# Patient Record
Sex: Female | Born: 1970
Health system: Southern US, Community
[De-identification: ages and names within clinical notes are randomized; demographics above are authoritative.]

## PROBLEM LIST (undated history)

## (undated) DIAGNOSIS — M5430 Sciatica, unspecified side: Secondary | ICD-10-CM

## (undated) DIAGNOSIS — D696 Thrombocytopenia, unspecified: Secondary | ICD-10-CM

## (undated) DIAGNOSIS — I5042 Chronic combined systolic (congestive) and diastolic (congestive) heart failure: Secondary | ICD-10-CM

## (undated) DIAGNOSIS — Z720 Tobacco use: Secondary | ICD-10-CM

## (undated) DIAGNOSIS — G4733 Obstructive sleep apnea (adult) (pediatric): Secondary | ICD-10-CM

## (undated) DIAGNOSIS — I110 Hypertensive heart disease with heart failure: Secondary | ICD-10-CM

## (undated) DIAGNOSIS — J45909 Unspecified asthma, uncomplicated: Secondary | ICD-10-CM

## (undated) DIAGNOSIS — I428 Other cardiomyopathies: Secondary | ICD-10-CM

## (undated) DIAGNOSIS — I272 Pulmonary hypertension, unspecified: Secondary | ICD-10-CM

## (undated) HISTORY — DX: Chronic combined systolic (congestive) and diastolic (congestive) heart failure: I50.42

## (undated) HISTORY — DX: Pulmonary hypertension, unspecified: I27.20

## (undated) HISTORY — DX: Other cardiomyopathies: I42.8

## (undated) HISTORY — PX: SMALL INTESTINE SURGERY: SHX150

## (undated) HISTORY — PX: APPENDECTOMY: SHX54

## (undated) HISTORY — PX: CHOLECYSTECTOMY: SHX55

## (undated) HISTORY — DX: Obstructive sleep apnea (adult) (pediatric): G47.33

## (undated) HISTORY — DX: Hypertensive heart disease with heart failure: I11.0

---

## 1998-12-11 ENCOUNTER — Encounter: Payer: Self-pay | Admitting: General Surgery

## 1998-12-11 ENCOUNTER — Encounter: Payer: Self-pay | Admitting: Emergency Medicine

## 1998-12-11 ENCOUNTER — Inpatient Hospital Stay (HOSPITAL_COMMUNITY): Admission: EM | Admit: 1998-12-11 | Discharge: 1998-12-15 | Payer: Self-pay | Admitting: Emergency Medicine

## 1999-03-06 ENCOUNTER — Encounter: Payer: Self-pay | Admitting: General Surgery

## 1999-03-06 ENCOUNTER — Ambulatory Visit (HOSPITAL_COMMUNITY): Admission: RE | Admit: 1999-03-06 | Discharge: 1999-03-06 | Payer: Self-pay | Admitting: General Surgery

## 2002-07-08 ENCOUNTER — Emergency Department (HOSPITAL_COMMUNITY): Admission: EM | Admit: 2002-07-08 | Discharge: 2002-07-08 | Payer: Self-pay | Admitting: Emergency Medicine

## 2002-07-09 ENCOUNTER — Emergency Department (HOSPITAL_COMMUNITY): Admission: EM | Admit: 2002-07-09 | Discharge: 2002-07-09 | Payer: Self-pay | Admitting: Emergency Medicine

## 2004-06-18 ENCOUNTER — Emergency Department (HOSPITAL_COMMUNITY): Admission: EM | Admit: 2004-06-18 | Discharge: 2004-06-18 | Payer: Self-pay | Admitting: Emergency Medicine

## 2004-10-04 ENCOUNTER — Emergency Department (HOSPITAL_COMMUNITY): Admission: EM | Admit: 2004-10-04 | Discharge: 2004-10-04 | Payer: Self-pay | Admitting: Emergency Medicine

## 2005-01-12 ENCOUNTER — Emergency Department (HOSPITAL_COMMUNITY): Admission: EM | Admit: 2005-01-12 | Discharge: 2005-01-12 | Payer: Self-pay | Admitting: Emergency Medicine

## 2005-10-03 ENCOUNTER — Emergency Department (HOSPITAL_COMMUNITY): Admission: EM | Admit: 2005-10-03 | Discharge: 2005-10-03 | Payer: Self-pay | Admitting: Emergency Medicine

## 2006-01-31 ENCOUNTER — Emergency Department (HOSPITAL_COMMUNITY): Admission: EM | Admit: 2006-01-31 | Discharge: 2006-01-31 | Payer: Self-pay | Admitting: Emergency Medicine

## 2014-05-07 ENCOUNTER — Encounter (HOSPITAL_COMMUNITY): Payer: Self-pay | Admitting: Emergency Medicine

## 2014-05-07 ENCOUNTER — Emergency Department (HOSPITAL_COMMUNITY)
Admission: EM | Admit: 2014-05-07 | Discharge: 2014-05-08 | Disposition: A | Discharge status: Home / Self Care | Payer: Medicaid Other | Attending: Emergency Medicine | Admitting: Emergency Medicine

## 2014-05-07 DIAGNOSIS — M545 Low back pain: Secondary | ICD-10-CM | POA: Diagnosis present

## 2014-05-07 DIAGNOSIS — N390 Urinary tract infection, site not specified: Secondary | ICD-10-CM | POA: Diagnosis not present

## 2014-05-07 DIAGNOSIS — I10 Essential (primary) hypertension: Secondary | ICD-10-CM | POA: Diagnosis not present

## 2014-05-07 DIAGNOSIS — J3489 Other specified disorders of nose and nasal sinuses: Secondary | ICD-10-CM | POA: Insufficient documentation

## 2014-05-07 DIAGNOSIS — Z3202 Encounter for pregnancy test, result negative: Secondary | ICD-10-CM | POA: Insufficient documentation

## 2014-05-07 DIAGNOSIS — M62830 Muscle spasm of back: Secondary | ICD-10-CM | POA: Diagnosis not present

## 2014-05-07 DIAGNOSIS — M5442 Lumbago with sciatica, left side: Secondary | ICD-10-CM | POA: Diagnosis not present

## 2014-05-07 DIAGNOSIS — Z720 Tobacco use: Secondary | ICD-10-CM | POA: Insufficient documentation

## 2014-05-07 DIAGNOSIS — J45909 Unspecified asthma, uncomplicated: Secondary | ICD-10-CM | POA: Insufficient documentation

## 2014-05-07 DIAGNOSIS — R350 Frequency of micturition: Secondary | ICD-10-CM

## 2014-05-07 HISTORY — DX: Unspecified asthma, uncomplicated: J45.909

## 2014-05-07 HISTORY — DX: Sciatica, unspecified side: M54.30

## 2014-05-07 LAB — URINALYSIS, ROUTINE W REFLEX MICROSCOPIC
Bilirubin Urine: NEGATIVE
GLUCOSE, UA: NEGATIVE mg/dL
Ketones, ur: NEGATIVE mg/dL
Nitrite: NEGATIVE
PROTEIN: 100 mg/dL — AB
Specific Gravity, Urine: 1.018 (ref 1.005–1.030)
UROBILINOGEN UA: 1 mg/dL (ref 0.0–1.0)
pH: 6.5 (ref 5.0–8.0)

## 2014-05-07 LAB — I-STAT CHEM 8, ED
BUN: 11 mg/dL (ref 6–23)
CALCIUM ION: 1.04 mmol/L — AB (ref 1.12–1.23)
CHLORIDE: 98 mmol/L (ref 96–112)
CREATININE: 1 mg/dL (ref 0.50–1.10)
Glucose, Bld: 99 mg/dL (ref 70–99)
HCT: 45 % (ref 36.0–46.0)
Hemoglobin: 15.3 g/dL — ABNORMAL HIGH (ref 12.0–15.0)
Potassium: 3.7 mmol/L (ref 3.5–5.1)
Sodium: 138 mmol/L (ref 135–145)
TCO2: 25 mmol/L (ref 0–100)

## 2014-05-07 LAB — POC URINE PREG, ED: Preg Test, Ur: NEGATIVE

## 2014-05-07 LAB — URINE MICROSCOPIC-ADD ON

## 2014-05-07 MED ORDER — DIAZEPAM 2 MG PO TABS
2.0000 mg | ORAL_TABLET | Freq: Once | ORAL | Status: AC
  Administered 2014-05-07: 2 mg via ORAL
  Filled 2014-05-07: qty 1

## 2014-05-07 MED ORDER — ALBUTEROL SULFATE HFA 108 (90 BASE) MCG/ACT IN AERS
2.0000 | INHALATION_SPRAY | Freq: Once | RESPIRATORY_TRACT | Status: AC
  Administered 2014-05-07: 2 via RESPIRATORY_TRACT
  Filled 2014-05-07: qty 6.7

## 2014-05-07 MED ORDER — HYDROCODONE-ACETAMINOPHEN 5-325 MG PO TABS
1.0000 | ORAL_TABLET | Freq: Once | ORAL | Status: AC
  Administered 2014-05-07: 1 via ORAL
  Filled 2014-05-07: qty 1

## 2014-05-07 NOTE — ED Provider Notes (Signed)
CSN: [id number]     Arrival date & time 05/07/14  1824 History   First MD Initiated Contact with Patient 05/07/14 2201     Chief Complaint  Patient presents with  . Back Pain     (Consider location/radiation/quality/duration/timing/severity/associated sxs/prior Treatment) HPI Comments: JARIYA REICHOW is a 44 y.o. female with a PMHx of asthma and sciatica, with a PSHx of cholecystectomy, appendectomy, and c-section, who presents to the ED with complaints of low back pain 2 days. She reports that she has 8/10 crampy low back/SI joint pain bilaterally which radiates down the left posterior leg, constant, worsening with ambulation, unrelieved with heat and medications chart prior to arrival. Denies any recent trauma/heavy lifting/twisting. States she has a history of sciatica and this is similar pain. Also reports having a headache that began today, gradual onset frontal, similar to her prior headaches related to her sinus congestion. She has not taken anything for her sinuses today, but in the past has taken Tylenol Sinus medication. She denies that this is the worst headache of her life or had any thunderclap onset. Additionally she reports increased urinary frequency and urgency over the last few days. States that she had a history of hypertension in pregnancy and took HCTZ for some time, but she has not been seen by a PCP in many years therefore she is not sure if still has HTN issues. Denies fevers, chills, sore throat, ear symptoms, rhinorrhea, CP, SOB, cough, abd pain, N/V/D/C, hematuria, dysuria, vaginal bleeding/discharge, myalgias, arthralgias, neck pain/stiffness, pelvic pain, cauda equina symptoms, weakness, numbness, dizziness, lightheadedness, syncope, vision changes, or rashes.   Patient is a 44 y.o. female presenting with back pain. The history is provided by the patient. No language interpreter was used.  Back Pain Location:  Sacro-iliac joint and lumbar spine Quality:  Cramping Radiates  to:  L posterior upper leg Pain severity:  Moderate Pain is:  Same all the time Onset quality:  Gradual Duration:  2 days Timing:  Constant Progression:  Unchanged Chronicity:  Recurrent Context: not recent illness, not recent injury and not twisting   Relieved by:  Nothing Worsened by:  Ambulation Ineffective treatments:  Heating pad Associated symptoms: headaches (chronic, same as her sinus headaches) and tingling (posterior L leg)   Associated symptoms: no abdominal pain, no bladder incontinence, no bowel incontinence, no chest pain, no dysuria, no fever, no leg pain, no numbness, no paresthesias, no pelvic pain, no perianal numbness and no weakness   Risk factors: lack of exercise and obesity   Risk factors: no hx of cancer     Past Medical History  Diagnosis Date  . Sciatica   . Asthma    Past Surgical History  Procedure Laterality Date  . Cholecystectomy    . Appendectomy    . Cesarean section    . Small intestine surgery     No family history on file. History  Substance Use Topics  . Smoking status: Current Every Day Smoker  . Smokeless tobacco: Not on file  . Alcohol Use: Yes   OB History    No data available     Review of Systems  Constitutional: Negative for fever and chills.  HENT: Positive for sinus pressure. Negative for ear discharge, ear pain, rhinorrhea, sore throat and trouble swallowing.   Eyes: Negative for photophobia and visual disturbance.  Respiratory: Negative for cough, shortness of breath and wheezing.   Cardiovascular: Negative for chest pain and leg swelling.  Gastrointestinal: Negative for nausea, vomiting, abdominal  pain, diarrhea, constipation and bowel incontinence.  Genitourinary: Positive for urgency and frequency. Negative for bladder incontinence, dysuria, hematuria, flank pain, vaginal bleeding, vaginal discharge and pelvic pain.  Musculoskeletal: Positive for back pain. Negative for myalgias, joint swelling, arthralgias, neck pain  and neck stiffness.  Skin: Negative for color change.  Neurological: Positive for tingling (posterior L leg) and headaches (chronic, same as her sinus headaches). Negative for dizziness, syncope, weakness, light-headedness, numbness and paresthesias.       +tingling down posterior L leg  Psychiatric/Behavioral: Negative for confusion.   10 Systems reviewed and are negative for acute change except as noted in the HPI.    Allergies  Review of patient's allergies indicates no known allergies.  Home Medications   Prior to Admission medications   Not on File   BP 165/107 mmHg  Pulse 113  Temp(Src) 98.2 F (36.8 C) (Oral)  Resp 22  SpO2 93%  LMP 12/06/2013 Physical Exam  Constitutional: She is oriented to person, place, and time. She appears well-developed and well-nourished.  Non-toxic appearance. No distress.  Afebrile, nontoxic, NAD, Triage vitals showing mild tachycardia at 113, BP 165/107. Tachycardia improved upon exam. Morbidly obese AAF  HENT:  Head: Normocephalic and atraumatic.  Nose: Mucosal edema and rhinorrhea present. Right sinus exhibits maxillary sinus tenderness and frontal sinus tenderness. Left sinus exhibits maxillary sinus tenderness and frontal sinus tenderness.  Mouth/Throat: Uvula is midline, oropharynx is clear and moist and mucous membranes are normal. No trismus in the jaw. No uvula swelling.  Nose with mucosal edema and clear rhinorrhea, b/l sinus TTP Oropharynx clear without tonsillar swelling or exudates.  Eyes: Conjunctivae and EOM are normal. Pupils are equal, round, and reactive to light. Right eye exhibits no discharge. Left eye exhibits no discharge.  PERRL, EOMI, no nystagmus, no visual field deficits  Neck: Normal range of motion. Neck supple. No spinous process tenderness and no muscular tenderness present. No rigidity. Normal range of motion present.  FROM intact without spinous process or paraspinous muscle TTP, no bony stepoffs or deformities, no  muscle spasms. No rigidity or meningeal signs. No bruising or swelling.   Cardiovascular: Normal rate, regular rhythm, normal heart sounds and intact distal pulses.  Exam reveals no gallop and no friction rub.   No murmur heard. Initially tachycardic in triage, resolved upon exam. Reg rhythm, nl s1/s2, no m/r/g, distal pulses intact, no pedal edema  Pulmonary/Chest: Effort normal and breath sounds normal. No respiratory distress. She has no decreased breath sounds. She has no wheezes. She has no rhonchi. She has no rales.  CTAB in all lung fields, no w/r/r, no hypoxia or increased WOB, speaking in full sentences, SpO2 93% on RA   Abdominal: Soft. Normal appearance and bowel sounds are normal. She exhibits no distension. There is no tenderness. There is no rigidity, no rebound, no guarding, no CVA tenderness, no tenderness at McBurney's point and negative Murphy's sign.  Soft, obese which limits exam, but NTND, +BS throughout, no r/g/r, neg murphy's, neg mcburney's, no CVA TTP   Musculoskeletal: Normal range of motion.       Lumbar back: She exhibits tenderness and spasm. She exhibits normal range of motion, no bony tenderness and no deformity.       Back:  Morbidly obese which somewhat limits exam, but lumbar spine with FROM intact without spinous process TTP, no bony stepoffs or deformities, mild b/l paraspinous muscle TTP with slight muscle spasms. Strength 5/5 in all extremities, sensation grossly intact in all extremities, +  L leg SLR, neg SLR on R leg, gait steady. No overlying skin changes.   Neurological: She is alert and oriented to person, place, and time. She has normal strength. No cranial nerve deficit or sensory deficit. Coordination and gait normal. GCS eye subscore is 4. GCS verbal subscore is 5. GCS motor subscore is 6.  CN 2-12 grossly intact A&O x4 GCS 15 Sensation and strength intact Gait nonataxic  Coordination WNL  Skin: Skin is warm, dry and intact. No rash noted.    Psychiatric: She has a normal mood and affect.  Nursing note and vitals reviewed.   ED Course  Procedures (including critical care time) Labs Review Labs Reviewed  URINALYSIS, ROUTINE W REFLEX MICROSCOPIC - Abnormal; Notable for the following:    APPearance CLOUDY (*)    Hgb urine dipstick TRACE (*)    Protein, ur 100 (*)    Leukocytes, UA MODERATE (*)    All other components within normal limits  URINE MICROSCOPIC-ADD ON - Abnormal; Notable for the following:    Squamous Epithelial / LPF MANY (*)    Bacteria, UA FEW (*)    All other components within normal limits  I-STAT CHEM 8, ED - Abnormal; Notable for the following:    Calcium, Ion 1.04 (*)    Hemoglobin 15.3 (*)    All other components within normal limits  URINE CULTURE  CBC WITH DIFFERENTIAL/PLATELET  I-STAT TROPOININ, ED  POC URINE PREG, ED    Imaging Review No results found.   EKG Interpretation   Date/Time:  Friday May 07 2014 22:31:26 EST Ventricular Rate:  101 PR Interval:  161 QRS Duration: 101 QT Interval:  358 QTC Calculation: 464 R Axis:   115 Text Interpretation:  Sinus tachycardia Right axis deviation Abnormal  R-wave progression, late transition Borderline T wave abnormalities Since  previous tracing axis has shifted rightward Confirmed by Karma Ganja  MD,  MARTHA [phone]) on 05/07/2014 10:34:20 PM      MDM   Final diagnoses:  Bilateral low back pain with left-sided sciatica  Back muscle spasm  Urinary frequency  UTI (lower urinary tract infection)  HTN (hypertension), benign    44 y.o. female with low back pain/SI joint pain bilaterally, sciatica down L leg, +SLR. No red flag s/s of low back pain. No s/s of central cord compression or cauda equina. Lower extremities are neurovascularly intact and patient is ambulating without difficulty. Back with muscle TTP and spasms. Doubt need for imaging. Pt also has HA which she states is her typical sinus headache, nothing different, nonfocal neuro  exam, doubt need for imaging. BP elevated at 165/107 but I suspect this may be attributed to not having adequate resting period prior to having VS checked, pt is morbidly obese and this could contribute to exertional VS changes. Will recheck VS now after rest period. During exam, HR normalized. Will still proceed with basic labs, U/A (given urinary frequency), and EKG. Doubt need for CXR since pt not complaining of CP/SOB. Will give norco and valium tablet for her pain/spasms. Will reassess shortly  11:28 PM Pt feeling better with back pain. Last VS check noted temp of 100.5, but on recheck (with no PO intake in 1hr) it was 99.4 oral. SpO2 on my exam is 95-96%, pt states she usually uses an inhaler at this time but doesn't have it with her, no audible wheezing on auscultation, and no c/o CP/SOB/cough, doubt need for CXR but will give albuterol here. Still awaiting labs. U/A in  process. Upreg neg. Of note, her EKG shows slight RAD new from last EKG. Her HTN has also improved down to 159/91. Will reassess after labs and U/A return.  12:32 AM Chem 8 unremarkable. Trop neg. CBC w/diff unremarkable. Upreg neg. U/A showing contamination with many squamous, +leuks, 3-6 WBC, few bacteria. Given her urinary frequency complaint, will treat as UTI but will add on a UCx. Pt continues to feel improved. VS stable, no further tachycardia or HTN at this time. Will have her f/up with PCP listed on medicaid card (Dr. Ashley Royalty) for HTN management, and recheck of back pain/possible UTI. Patient was counseled on back pain precautions and told to do activity as tolerated but do not lift, push, or pull heavy objects more than 10 pounds for the next week. Patient counseled to use ice or heat on back for no longer than 15 minutes every hour. Rx given for muscle relaxer and counseled on proper use of muscle relaxant medication. Rx given for narcotic pain medicine and counseled on proper use of narcotic pain medications. Told that  they can increase to every 4 hrs if needed while pain is worse. Counseled not to combine this medication with others containing tylenol. Urged patient not to drink alcohol, drive, or perform any other activities that requires focus while taking either of these medications. Patient urged to follow-up with PCP if pain does not improve with treatment and rest or if pain becomes recurrent. Urged to return with worsening severe pain, loss of bowel or bladder control, trouble walking. The patient verbalizes understanding and agrees with the plan.   BP 159/91 mmHg  Pulse 102  Temp(Src) 99.2 F (37.3 C) (Oral)  Resp 20  SpO2 92%  LMP 12/06/2013  Meds ordered this encounter  Medications  . diazepam (VALIUM) tablet 2 mg    Sig:   . HYDROcodone-acetaminophen (NORCO/VICODIN) 5-325 MG per tablet 1 tablet    Sig:   . albuterol (PROVENTIL HFA;VENTOLIN HFA) 108 (90 BASE) MCG/ACT inhaler 2 puff    Sig:   . cyclobenzaprine (FLEXERIL) 10 MG tablet    Sig: Take 1 tablet (10 mg total) by mouth 3 (three) times daily as needed for muscle spasms.    Dispense:  15 tablet    Refill:  0    Order Specific Question:  Supervising Provider    Answer:  MILLER, BRIAN [[id number]]  . naproxen (NAPROSYN) 500 MG tablet    Sig: Take 1 tablet (500 mg total) by mouth 2 (two) times daily as needed for mild pain, moderate pain or headache (TAKE WITH MEALS.).    Dispense:  20 tablet    Refill:  0    Order Specific Question:  Supervising Provider    Answer:  MILLER, BRIAN [[id number]]  . predniSONE (DELTASONE) 20 MG tablet    Sig: 3 tabs po daily x 4 days    Dispense:  12 tablet    Refill:  0    Order Specific Question:  Supervising Provider    Answer:  MILLER, BRIAN [[id number]]  . HYDROcodone-acetaminophen (NORCO) 5-325 MG per tablet    Sig: Take 1 tablet by mouth every 6 (six) hours as needed for severe pain.    Dispense:  10 tablet    Refill:  0    Order Specific Question:  Supervising Provider    Answer:  Hyacinth Meeker, BRIAN [[id number]]  .  sulfamethoxazole-trimethoprim (BACTRIM DS,SEPTRA DS) 800-160 MG per tablet    Sig: Take 1 tablet by mouth 2 (two) times daily.  Dispense:  14 tablet    Refill:  0    Order Specific Question:  Supervising Provider    Answer:  Eber Hong [[id number]]       Karyl Sharrar Camprubi-Soms, PA-C 05/08/14 0034  Jerelyn Scott, MD 05/08/14 [phone]

## 2014-05-07 NOTE — ED Notes (Signed)
PA-C at bedside 

## 2014-05-07 NOTE — ED Notes (Addendum)
Pt c/o lower back to buttocks and down left leg pain. Pt states she does have sciatica. Pt has not been dx with HTN but does have HTN in triage along with a headache.

## 2014-05-08 LAB — CBC WITH DIFFERENTIAL/PLATELET
Basophils Absolute: 0.1 10*3/uL (ref 0.0–0.1)
Basophils Relative: 1 % (ref 0–1)
EOS PCT: 0 % (ref 0–5)
Eosinophils Absolute: 0 10*3/uL (ref 0.0–0.7)
HCT: 41.9 % (ref 36.0–46.0)
HEMOGLOBIN: 14 g/dL (ref 12.0–15.0)
LYMPHS PCT: 15 % (ref 12–46)
Lymphs Abs: 0.9 10*3/uL (ref 0.7–4.0)
MCH: 31 pg (ref 26.0–34.0)
MCHC: 33.4 g/dL (ref 30.0–36.0)
MCV: 92.7 fL (ref 78.0–100.0)
MONO ABS: 0.5 10*3/uL (ref 0.1–1.0)
MONOS PCT: 8 % (ref 3–12)
Neutro Abs: 4.3 10*3/uL (ref 1.7–7.7)
Neutrophils Relative %: 76 % (ref 43–77)
Platelets: 30 10*3/uL — ABNORMAL LOW (ref 150–400)
RBC: 4.52 MIL/uL (ref 3.87–5.11)
RDW: 13.2 % (ref 11.5–15.5)
WBC: 5.8 10*3/uL (ref 4.0–10.5)

## 2014-05-08 LAB — I-STAT TROPONIN, ED: Troponin i, poc: 0.01 ng/mL (ref 0.00–0.08)

## 2014-05-08 MED ORDER — CYCLOBENZAPRINE HCL 10 MG PO TABS: 10.0000 mg | ORAL_TABLET | Freq: Three times a day (TID) | ORAL | Status: DC | PRN

## 2014-05-08 MED ORDER — PREDNISONE 20 MG PO TABS: ORAL_TABLET | ORAL | Status: DC

## 2014-05-08 MED ORDER — NAPROXEN 500 MG PO TABS: 500.0000 mg | ORAL_TABLET | Freq: Two times a day (BID) | ORAL | Status: DC | PRN

## 2014-05-08 MED ORDER — SULFAMETHOXAZOLE-TRIMETHOPRIM 800-160 MG PO TABS: 1.0000 | ORAL_TABLET | Freq: Two times a day (BID) | ORAL | Status: DC

## 2014-05-08 MED ORDER — HYDROCODONE-ACETAMINOPHEN 5-325 MG PO TABS: 1.0000 | ORAL_TABLET | Freq: Four times a day (QID) | ORAL | Status: DC | PRN

## 2014-05-08 NOTE — Discharge Instructions (Signed)
Your blood pressure was high today, please see your regular doctor for ongoing care of this. Stay very well hydrated with plenty of water throughout the day. Take antibiotic until completed. Follow up with primary care physician in 1 week for recheck of ongoing symptoms but return to ER for emergent changing or worsening of symptoms. Please seek immediate care if you develop the following: You develop back pain.  Your symptoms are no better, or worse in 3 days. There is severe back pain or lower abdominal pain.  You develop chills.  You have a fever.  There is nausea or vomiting.  There is continued burning or discomfort with urination.    Back Pain:  Your back pain should be treated with medicines such as ibuprofen or aleve and this back pain should get better over the next 2 weeks.  However if you develop severe or worsening pain, low back pain with fever, numbness, weakness or inability to walk or urinate, you should return to the ER immediately.  Please follow up with your doctor this week for a recheck if still having symptoms.  Low back pain is discomfort in the lower back that may be due to injuries to muscles and ligaments around the spine.  Occasionally, it may be caused by a a problem to a part of the spine called a disc.  The pain may last several days or a week;  However, most patients get completely well in 4 weeks.  Self - care:  The application of heat can help soothe the pain.  Maintaining your daily activities, including walking, is encourged, as it will help you get better faster than just staying in bed. Perform gentle stretching as discussed. Drink plenty of fluids.  Medications are also useful to help with pain control.  A commonly prescribed medication includes vicodin.  Do not drive or operate heavy machinery while taking this medication.  Non steroidal anti inflammatory medications including Ibuprofen and naproxen;  These medications help both pain and swelling and are  very useful in treating back pain.  They should be taken with food, as they can cause stomach upset, and more seriously, stomach bleeding.    Muscle relaxants:  These medications can help with muscle tightness that is a cause of lower back pain.  Most of these medications can cause drowsiness, and it is not safe to drive or use dangerous machinery while taking them.  Prednisone: helps with sciatica pain, take as directed.  SEEK IMMEDIATE MEDICAL ATTENTION IF: New numbness, tingling, weakness, or problem with the use of your arms or legs.  Severe back pain not relieved with medications.  Difficulty with or loss of control of your bowel or bladder control.  Increasing pain in any areas of the body (such as chest or abdominal pain).  Shortness of breath, dizziness or fainting.  Nausea (feeling sick to your stomach), vomiting, fever, or sweats.  You will need to follow up with  Your primary healthcare provider in 1-2 weeks   Back Pain, Adult Back pain is very common. The pain often gets better over time. The cause of back pain is usually not dangerous. Most people can learn to manage their back pain on their own.  HOME CARE   Stay active. Start with short walks on flat ground if you can. Try to walk farther each day.  Do not sit, drive, or stand in one place for more than 30 minutes. Do not stay in bed.  Do not avoid exercise or work.  Activity can help your back heal faster.  Be careful when you bend or lift an object. Bend at your knees, keep the object close to you, and do not twist.  Sleep on a firm mattress. Lie on your side, and bend your knees. If you lie on your back, put a pillow under your knees.  Only take medicines as told by your doctor.  Put ice on the injured area.  Put ice in a plastic bag.  Place a towel between your skin and the bag.  Leave the ice on for 15-20 minutes, 03-04 times a day for the first 2 to 3 days. After that, you can switch between ice and heat  packs.  Ask your doctor about back exercises or massage.  Avoid feeling anxious or stressed. Find good ways to deal with stress, such as exercise. GET HELP RIGHT AWAY IF:   Your pain does not go away with rest or medicine.  Your pain does not go away in 1 week.  You have new problems.  You do not feel well.  The pain spreads into your legs.  You cannot control when you poop (bowel movement) or pee (urinate).  Your arms or legs feel weak or lose feeling (numbness).  You feel sick to your stomach (nauseous) or throw up (vomit).  You have belly (abdominal) pain.  You feel like you may pass out (faint). MAKE SURE YOU:   Understand these instructions.  Will watch your condition.  Will get help right away if you are not doing well or get worse. Document Released: 08/01/2007 Document Revised: 05/07/2011 Document Reviewed: 06/16/2013 [facility] Patient Information 2015 [place], [place]. This information is not intended to replace advice given to you by your health care provider. Make sure you discuss any questions you have with your health care provider.  Back Exercises Back exercises help treat and prevent back injuries. The goal of back exercises is to increase the strength of your abdominal and back muscles and the flexibility of your back. These exercises should be started when you no longer have back pain. Back exercises include:  Pelvic Tilt. Lie on your back with your knees bent. Tilt your pelvis until the lower part of your back is against the floor. Hold this position 5 to 10 sec and repeat 5 to 10 times.  Knee to Chest. Pull first 1 knee up against your chest and hold for 20 to 30 seconds, repeat this with the other knee, and then both knees. This may be done with the other leg straight or bent, whichever feels better.  Sit-Ups or Curl-Ups. Bend your knees 90 degrees. Start with tilting your pelvis, and do a partial, slow sit-up, lifting your trunk only 30 to 45 degrees off  the floor. Take at least 2 to 3 seconds for each sit-up. Do not do sit-ups with your knees out straight. If partial sit-ups are difficult, simply do the above but with only tightening your abdominal muscles and holding it as directed.  Hip-Lift. Lie on your back with your knees flexed 90 degrees. Push down with your feet and shoulders as you raise your hips a couple inches off the floor; hold for 10 seconds, repeat 5 to 10 times.  Back arches. Lie on your stomach, propping yourself up on bent elbows. Slowly press on your hands, causing an arch in your low back. Repeat 3 to 5 times. Any initial stiffness and discomfort should lessen with repetition over time.  Shoulder-Lifts. Lie face down with arms  beside your body. Keep hips and torso pressed to floor as you slowly lift your head and shoulders off the floor. Do not overdo your exercises, especially in the beginning. Exercises may cause you some mild back discomfort which lasts for a few minutes; however, if the pain is more severe, or lasts for more than 15 minutes, do not continue exercises until you see your caregiver. Improvement with exercise therapy for back problems is slow.  See your caregivers for assistance with developing a proper back exercise program. Document Released: 03/22/2004 Document Revised: 05/07/2011 Document Reviewed: 12/14/2010 [facility] Patient Information 2015 [place], [place]. This information is not intended to replace advice given to you by your health care provider. Make sure you discuss any questions you have with your health care provider.  Hypertension Hypertension is another name for high blood pressure. High blood pressure forces your heart to work harder to pump blood. A blood pressure reading has two numbers, which includes a higher number over a lower number (example: 110/72). HOME CARE   Have your blood pressure rechecked by your doctor.  Only take medicine as told by your doctor. Follow the directions  carefully. The medicine does not work as well if you skip doses. Skipping doses also puts you at risk for problems.  Do not smoke.  Monitor your blood pressure at home as told by your doctor. GET HELP IF:  You think you are having a reaction to the medicine you are taking.  You have repeat headaches or feel dizzy.  You have puffiness (swelling) in your ankles.  You have trouble with your vision. GET HELP RIGHT AWAY IF:   You get a very bad headache and are confused.  You feel weak, numb, or faint.  You get chest or belly (abdominal) pain.  You throw up (vomit).  You cannot breathe very well. MAKE SURE YOU:   Understand these instructions.  Will watch your condition.  Will get help right away if you are not doing well or get worse. Document Released: 08/01/2007 Document Revised: 02/17/2013 Document Reviewed: 12/05/2012 [facility] Patient Information 2015 [place], [place]. This information is not intended to replace advice given to you by your health care provider. Make sure you discuss any questions you have with your health care provider.  Urinary Tract Infection A urinary tract infection (UTI) can occur any place along the urinary tract. The tract includes the kidneys, ureters, bladder, and urethra. A type of germ called bacteria often causes a UTI. UTIs are often helped with antibiotic medicine.  HOME CARE   If given, take antibiotics as told by your doctor. Finish them even if you start to feel better.  Drink enough fluids to keep your pee (urine) clear or pale yellow.  Avoid tea, drinks with caffeine, and bubbly (carbonated) drinks.  Pee often. Avoid holding your pee in for a long time.  Pee before and after having sex (intercourse).  Wipe from front to back after you poop (bowel movement) if you are a woman. Use each tissue only once. GET HELP RIGHT AWAY IF:   You have back pain.  You have lower belly (abdominal) pain.  You have chills.  You feel sick to  your stomach (nauseous).  You throw up (vomit).  Your burning or discomfort with peeing does not go away.  You have a fever.  Your symptoms are not better in 3 days. MAKE SURE YOU:   Understand these instructions.  Will watch your condition.  Will get help right away if  you are not doing well or get worse. Document Released: 08/01/2007 Document Revised: 11/07/2011 Document Reviewed: 09/13/2011 [facility] Patient Information 2015 [place]. This information is not intended to replace advice given to you by your health care provider. Make sure you discuss any questions you have with your health care provider.

## 2014-05-09 LAB — URINE CULTURE
Colony Count: 70000
Special Requests: NORMAL

## 2015-03-08 ENCOUNTER — Emergency Department (HOSPITAL_COMMUNITY): Payer: Medicaid Other

## 2015-03-08 ENCOUNTER — Emergency Department (HOSPITAL_COMMUNITY)
Admission: EM | Admit: 2015-03-08 | Discharge: 2015-03-08 | Disposition: A | Discharge status: Home / Self Care | Payer: Medicaid Other | Attending: Emergency Medicine | Admitting: Emergency Medicine

## 2015-03-08 ENCOUNTER — Encounter (HOSPITAL_COMMUNITY): Payer: Self-pay

## 2015-03-08 DIAGNOSIS — Z792 Long term (current) use of antibiotics: Secondary | ICD-10-CM | POA: Diagnosis not present

## 2015-03-08 DIAGNOSIS — J45909 Unspecified asthma, uncomplicated: Secondary | ICD-10-CM | POA: Diagnosis not present

## 2015-03-08 DIAGNOSIS — F17200 Nicotine dependence, unspecified, uncomplicated: Secondary | ICD-10-CM | POA: Insufficient documentation

## 2015-03-08 DIAGNOSIS — M79672 Pain in left foot: Secondary | ICD-10-CM | POA: Diagnosis present

## 2015-03-08 MED ORDER — CEPHALEXIN 500 MG PO CAPS: 500.0000 mg | ORAL_CAPSULE | Freq: Four times a day (QID) | ORAL | Status: DC

## 2015-03-08 MED ORDER — HYDROCODONE-ACETAMINOPHEN 5-325 MG PO TABS: 1.0000 | ORAL_TABLET | Freq: Four times a day (QID) | ORAL | Status: DC | PRN

## 2015-03-08 MED ORDER — HYDROCODONE-ACETAMINOPHEN 5-325 MG PO TABS
2.0000 | ORAL_TABLET | Freq: Once | ORAL | Status: AC
  Administered 2015-03-08: 2 via ORAL
  Filled 2015-03-08: qty 2

## 2015-03-08 NOTE — ED Notes (Signed)
Pt with pain in left foot and ankle.  Had since last Tuesday.  Swelling started Thursday.  Pt using massage and epsom soaks at home with no relief.  Tylenol did not help.  Pt having difficulty ambulating.  No trauma.

## 2015-03-08 NOTE — ED Provider Notes (Signed)
History  By signing my name below, I, Karle Plumber, attest that this documentation has been prepared under the direction and in the presence of Rob Canovanas, [place]. Electronically Signed: Karle Plumber, ED Scribe. 03/08/2015. 12:42 PM.  Chief Complaint  Patient presents with  . Foot Pain   The history is provided by the patient and medical records. No language interpreter was used.    HPI Comments:  Jennifer Owens is a 45 y.o. obese female who presents to the Emergency Department complaining of severe left foot pain that began about one weeks ago. She states the foot began swelling and the throbbing pain increased about 5 days ago. She has been soaking the foot in epsom salt and taking Tylenol with no significant relief of the pain. Bearing weight and walking increase the pain. She denies alleviating factors. She denies numbness, tingling or weakness of the left foot. She denies trauma, injury or fall. She denies allergies to any medications.  Past Medical History  Diagnosis Date  . Sciatica   . Asthma    Past Surgical History  Procedure Laterality Date  . Cholecystectomy    . Appendectomy    . Cesarean section    . Small intestine surgery     History reviewed. No pertinent family history. Social History  Substance Use Topics  . Smoking status: Current Every Day Smoker  . Smokeless tobacco: None  . Alcohol Use: Yes   OB History    No data available     Review of Systems  Musculoskeletal: Positive for arthralgias.  Skin: Positive for color change (mild erythema).  Neurological: Negative for weakness and numbness.    Allergies  Review of patient's allergies indicates no known allergies.  Home Medications   Prior to Admission medications   Medication Sig Start Date End Date Taking? Authorizing Provider  cephALEXin (KEFLEX) 500 MG capsule Take 1 capsule (500 mg total) by mouth 4 (four) times daily. 03/08/15   Roxy Horseman, PA-C  cyclobenzaprine (FLEXERIL) 10 MG  tablet Take 1 tablet (10 mg total) by mouth 3 (three) times daily as needed for muscle spasms. 05/08/14   Mercedes Camprubi-Soms, PA-C  HYDROcodone-acetaminophen (NORCO/VICODIN) 5-325 MG tablet Take 1-2 tablets by mouth every 6 (six) hours as needed. 03/08/15   Roxy Horseman, PA-C  naproxen (NAPROSYN) 500 MG tablet Take 1 tablet (500 mg total) by mouth 2 (two) times daily as needed for mild pain, moderate pain or headache (TAKE WITH MEALS.). 05/08/14   Mercedes Camprubi-Soms, PA-C  predniSONE (DELTASONE) 20 MG tablet 3 tabs po daily x 4 days 05/08/14   Mercedes Camprubi-Soms, PA-C  sulfamethoxazole-trimethoprim (BACTRIM DS,SEPTRA DS) 800-160 MG per tablet Take 1 tablet by mouth 2 (two) times daily. 05/08/14   Mercedes Camprubi-Soms, PA-C   Triage Vitals: BP 165/115 mmHg  Pulse 91  Temp(Src) 98.2 F (36.8 C) (Oral)  Resp 18  SpO2 95% Physical Exam Physical Exam  Constitutional: Pt appears well-developed and well-nourished. No distress.  HENT:  Head: Normocephalic and atraumatic.  Eyes: Conjunctivae are normal.  Neck: Normal range of motion.  Cardiovascular: Normal rate, regular rhythm and intact distal pulses.   Capillary refill < 3 sec  Pulmonary/Chest: Effort normal and breath sounds normal.  Musculoskeletal: Pt exhibits tenderness over lateral aspect of left foot. No bony abnormality or deformity. Pt exhibits no edema.  ROM: 5/5  Neurological: Pt  is alert. Coordination normal.  Sensation 5/5 Strength 5/5  Skin: Skin is warm and dry. Pt is not diaphoretic. Mild erythema over lateral  aspect of left foot. No abscess. No tenting of the skin  Psychiatric: Pt has a normal mood and affect.  Nursing note and vitals reviewed.   ED Course  Procedures (including critical care time) DIAGNOSTIC STUDIES: Oxygen Saturation is 95% on RA, adequate by my interpretation.   COORDINATION OF CARE: 12:38 PM- Will prescribe antibiotic and pain medication. Return precautions discussed. Advised pt to  follow up with PCP. Will order crutches at pt's request. Pt verbalizes understanding and agrees to plan.  Medications  HYDROcodone-acetaminophen (NORCO/VICODIN) 5-325 MG per tablet 2 tablet (not administered)   Imaging Review Dg Ankle Complete Left  03/08/2015  CLINICAL DATA:  Increasing pain. Soft tissue swelling anterior left ankle. EXAM: LEFT ANKLE COMPLETE - 3+ VIEW COMPARISON:  06/18/2004 FINDINGS: No acute bony abnormality. Specifically, no fracture, subluxation, or dislocation. Soft tissues are intact. Mild degenerative changes within the left ankle and hindfoot/ midfoot. Soft tissue calcifications within the left calf. IMPRESSION: No acute bony abnormality. Electronically Signed   By: Charlett Nose M.D.   On: 03/08/2015 12:31   Dg Foot Complete Left  03/08/2015  CLINICAL DATA:  Pain EXAM: LEFT FOOT - COMPLETE 3+ VIEW COMPARISON:  None. FINDINGS: There is no evidence of fracture or dislocation. 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 There is mild osteoarthritis of the talonavicular joint. There is a small plantar calcaneal spur. Soft tissues are unremarkable. IMPRESSION: No acute osseous injury of the left foot. Electronically Signed   By: Elige Ko   On: 03/08/2015 12:31   I have personally reviewed and evaluated these images and lab results as part of my medical decision-making.   MDM   Final diagnoses:  Left foot pain    Patient with left foot pain. Symptoms started on Tuesday. She now has swelling. Consider gout versus mild cellulitis. Will treat with pain medicine, recommend NSAIDs, and will give some Keflex just in case it is a developing cellulitis. Imaging is negative. Patient is ambulatory with antalgic gait. No injuries.  I personally performed the services described in this documentation, which was scribed in my presence. The recorded information has been reviewed and is accurate.       Roxy Horseman, PA-C 03/08/15  [id number]  Cathren Laine, MD 03/15/15 [id number]

## 2015-03-08 NOTE — Progress Notes (Signed)
Medicaid [place] access response hx has pt pcp listed as [facility] [address] [place] [phone]

## 2015-03-08 NOTE — Discharge Instructions (Signed)
You have been diagnosed left foot pain.  This could be gout or cellulitis.  Please follow-up with your Doctor in 3 days.  Please take medications as directed.  Cellulitis Cellulitis is an infection of the skin and the tissue beneath it. The infected area is usually red and tender. Cellulitis occurs most often in the arms and lower legs.  CAUSES  Cellulitis is caused by bacteria that enter the skin through cracks or cuts in the skin. The most common types of bacteria that cause cellulitis are staphylococci and streptococci. SIGNS AND SYMPTOMS   Redness and warmth.  Swelling.  Tenderness or pain.  Fever. DIAGNOSIS  Your health care provider can usually determine what is wrong based on a physical exam. Blood tests may also be done. TREATMENT  Treatment usually involves taking an antibiotic medicine. HOME CARE INSTRUCTIONS   Take your antibiotic medicine as directed by your health care provider. Finish the antibiotic even if you start to feel better.  Keep the infected arm or leg elevated to reduce swelling.  Apply a warm cloth to the affected area up to 4 times per day to relieve pain.  Take medicines only as directed by your health care provider.  Keep all follow-up visits as directed by your health care provider. SEEK MEDICAL CARE IF:   You notice red streaks coming from the infected area.  Your red area gets larger or turns dark in color.  Your bone or joint underneath the infected area becomes painful after the skin has healed.  Your infection returns in the same area or another area.  You notice a swollen bump in the infected area.  You develop new symptoms.  You have a fever. SEEK IMMEDIATE MEDICAL CARE IF:   You feel very sleepy.  You develop vomiting or diarrhea.  You have a general ill feeling (malaise) with muscle aches and pains.   This information is not intended to replace advice given to you by your health care provider. Make sure you discuss any  questions you have with your health care provider.   Document Released: 11/22/2004 Document Revised: 11/03/2014 Document Reviewed: 04/30/2011 Elsevier Interactive Patient Education 2016 [employer] Gout Gout is an inflammatory arthritis caused by a buildup of uric acid crystals in the joints. Uric acid is a chemical that is normally present in the blood. When the level of uric acid in the blood is too high it can form crystals that deposit in your joints and tissues. This causes joint redness, soreness, and swelling (inflammation). Repeat attacks are common. Over time, uric acid crystals can form into masses (tophi) near a joint, destroying bone and causing disfigurement. Gout is treatable and often preventable. CAUSES  The disease begins with elevated levels of uric acid in the blood. Uric acid is produced by your body when it breaks down a naturally found substance called purines. Certain foods you eat, such as meats and fish, contain high amounts of purines. Causes of an elevated uric acid level include:  Being passed down from parent to child (heredity).  Diseases that cause increased uric acid production (such as obesity, psoriasis, and certain cancers).  Excessive alcohol use.  Diet, especially diets rich in meat and seafood.  Medicines, including certain cancer-fighting medicines (chemotherapy), water pills (diuretics), and aspirin.  Chronic kidney disease. The kidneys are no longer able to remove uric acid well.  Problems with metabolism. Conditions strongly associated with gout include:  Obesity.  High blood pressure.  High cholesterol.  Diabetes. Not everyone  with elevated uric acid levels gets gout. It is not understood why some people get gout and others do not. Surgery, joint injury, and eating too much of certain foods are some of the factors that can lead to gout attacks. SYMPTOMS   An attack of gout comes on quickly. It causes intense pain with redness, swelling,  and warmth in a joint.  Fever can occur.  Often, only one joint is involved. Certain joints are more commonly involved:  Base of the big toe.  Knee.  Ankle.  Wrist.  Finger. Without treatment, an attack usually goes away in a few days to weeks. Between attacks, you usually will not have symptoms, which is different from many other forms of arthritis. DIAGNOSIS  Your caregiver will suspect gout based on your symptoms and exam. In some cases, tests may be recommended. The tests may include:  Blood tests.  Urine tests.  X-rays.  Joint fluid exam. This exam requires a needle to remove fluid from the joint (arthrocentesis). Using a microscope, gout is confirmed when uric acid crystals are seen in the joint fluid. TREATMENT  There are two phases to gout treatment: treating the sudden onset (acute) attack and preventing attacks (prophylaxis).  Treatment of an Acute Attack.  Medicines are used. These include anti-inflammatory medicines or steroid medicines.  An injection of steroid medicine into the affected joint is sometimes necessary.  The painful joint is rested. Movement can worsen the arthritis.  You may use warm or cold treatments on painful joints, depending which works best for you.  Treatment to Prevent Attacks.  If you suffer from frequent gout attacks, your caregiver may advise preventive medicine. These medicines are started after the acute attack subsides. These medicines either help your kidneys eliminate uric acid from your body or decrease your uric acid production. You may need to stay on these medicines for a very long time.  The early phase of treatment with preventive medicine can be associated with an increase in acute gout attacks. For this reason, during the first few months of treatment, your caregiver may also advise you to take medicines usually used for acute gout treatment. Be sure you understand your caregiver's directions. Your caregiver may make  several adjustments to your medicine dose before these medicines are effective.  Discuss dietary treatment with your caregiver or dietitian. Alcohol and drinks high in sugar and fructose and foods such as meat, poultry, and seafood can increase uric acid levels. Your caregiver or dietitian can advise you on drinks and foods that should be limited. HOME CARE INSTRUCTIONS   Do not take aspirin to relieve pain. This raises uric acid levels.  Only take over-the-counter or prescription medicines for pain, discomfort, or fever as directed by your caregiver.  Rest the joint as much as possible. When in bed, keep sheets and blankets off painful areas.  Keep the affected joint raised (elevated).  Apply warm or cold treatments to painful joints. Use of warm or cold treatments depends on which works best for you.  Use crutches if the painful joint is in your leg.  Drink enough fluids to keep your urine clear or pale yellow. This helps your body get rid of uric acid. Limit alcohol, sugary drinks, and fructose drinks.  Follow your dietary instructions. Pay careful attention to the amount of protein you eat. Your daily diet should emphasize fruits, vegetables, whole grains, and fat-free or low-fat milk products. Discuss the use of coffee, vitamin C, and cherries with your caregiver or  dietitian. These may be helpful in lowering uric acid levels.  Maintain a healthy body weight. SEEK MEDICAL CARE IF:   You develop diarrhea, vomiting, or any side effects from medicines.  You do not feel better in 24 hours, or you are getting worse. SEEK IMMEDIATE MEDICAL CARE IF:   Your joint becomes suddenly more tender, and you have chills or a fever. MAKE SURE YOU:   Understand these instructions.  Will watch your condition.  Will get help right away if you are not doing well or get worse.   This information is not intended to replace advice given to you by your health care provider. Make sure you discuss  any questions you have with your health care provider.   Document Released: 02/10/2000 Document Revised: 03/05/2014 Document Reviewed: 09/26/2011 Elsevier Interactive Patient Education [employer]

## 2015-07-21 ENCOUNTER — Emergency Department (HOSPITAL_COMMUNITY): Payer: Self-pay

## 2015-07-21 ENCOUNTER — Inpatient Hospital Stay (HOSPITAL_COMMUNITY)
Admission: EM | Admit: 2015-07-21 | Discharge: 2015-07-25 | DRG: 202 | Disposition: A | Discharge status: Home / Self Care | Payer: Medicaid Other | Attending: Internal Medicine | Admitting: Internal Medicine

## 2015-07-21 DIAGNOSIS — J9601 Acute respiratory failure with hypoxia: Secondary | ICD-10-CM | POA: Diagnosis present

## 2015-07-21 DIAGNOSIS — R0902 Hypoxemia: Secondary | ICD-10-CM

## 2015-07-21 DIAGNOSIS — Z833 Family history of diabetes mellitus: Secondary | ICD-10-CM

## 2015-07-21 DIAGNOSIS — Z8249 Family history of ischemic heart disease and other diseases of the circulatory system: Secondary | ICD-10-CM

## 2015-07-21 DIAGNOSIS — J45909 Unspecified asthma, uncomplicated: Secondary | ICD-10-CM | POA: Diagnosis present

## 2015-07-21 DIAGNOSIS — T501X5A Adverse effect of loop [high-ceiling] diuretics, initial encounter: Secondary | ICD-10-CM | POA: Diagnosis present

## 2015-07-21 DIAGNOSIS — I5033 Acute on chronic diastolic (congestive) heart failure: Secondary | ICD-10-CM | POA: Diagnosis present

## 2015-07-21 DIAGNOSIS — J45901 Unspecified asthma with (acute) exacerbation: Principal | ICD-10-CM | POA: Diagnosis present

## 2015-07-21 DIAGNOSIS — I160 Hypertensive urgency: Secondary | ICD-10-CM | POA: Diagnosis present

## 2015-07-21 DIAGNOSIS — F17200 Nicotine dependence, unspecified, uncomplicated: Secondary | ICD-10-CM | POA: Diagnosis present

## 2015-07-21 DIAGNOSIS — D696 Thrombocytopenia, unspecified: Secondary | ICD-10-CM | POA: Diagnosis present

## 2015-07-21 DIAGNOSIS — Z720 Tobacco use: Secondary | ICD-10-CM | POA: Diagnosis present

## 2015-07-21 DIAGNOSIS — N179 Acute kidney failure, unspecified: Secondary | ICD-10-CM | POA: Diagnosis present

## 2015-07-21 DIAGNOSIS — I110 Hypertensive heart disease with heart failure: Secondary | ICD-10-CM | POA: Diagnosis present

## 2015-07-21 LAB — I-STAT CHEM 8, ED
BUN: 13 mg/dL (ref 6–20)
Calcium, Ion: 1.13 mmol/L (ref 1.12–1.23)
Chloride: 97 mmol/L — ABNORMAL LOW (ref 101–111)
Creatinine, Ser: 1.1 mg/dL — ABNORMAL HIGH (ref 0.44–1.00)
Glucose, Bld: 98 mg/dL (ref 65–99)
HEMATOCRIT: 45 % (ref 36.0–46.0)
HEMOGLOBIN: 15.3 g/dL — AB (ref 12.0–15.0)
Potassium: 4 mmol/L (ref 3.5–5.1)
SODIUM: 142 mmol/L (ref 135–145)
TCO2: 34 mmol/L (ref 0–100)

## 2015-07-21 LAB — I-STAT CG4 LACTIC ACID, ED: LACTIC ACID, VENOUS: 1.07 mmol/L (ref 0.5–2.0)

## 2015-07-21 LAB — CBC WITH DIFFERENTIAL/PLATELET
BASOS PCT: 0 %
Basophils Absolute: 0 10*3/uL (ref 0.0–0.1)
EOS ABS: 0.2 10*3/uL (ref 0.0–0.7)
Eosinophils Relative: 2 %
HCT: 42.6 % (ref 36.0–46.0)
HEMOGLOBIN: 14.2 g/dL (ref 12.0–15.0)
Lymphocytes Relative: 21 %
Lymphs Abs: 2 10*3/uL (ref 0.7–4.0)
MCH: 31.8 pg (ref 26.0–34.0)
MCHC: 33.3 g/dL (ref 30.0–36.0)
MCV: 95.3 fL (ref 78.0–100.0)
MONOS PCT: 10 %
Monocytes Absolute: 1 10*3/uL (ref 0.1–1.0)
NEUTROS PCT: 67 %
Neutro Abs: 6.4 10*3/uL (ref 1.7–7.7)
Platelets: 129 10*3/uL — ABNORMAL LOW (ref 150–400)
RBC: 4.47 MIL/uL (ref 3.87–5.11)
RDW: 13.4 % (ref 11.5–15.5)
WBC: 9.5 10*3/uL (ref 4.0–10.5)

## 2015-07-21 LAB — I-STAT TROPONIN, ED: TROPONIN I, POC: 0.02 ng/mL (ref 0.00–0.08)

## 2015-07-21 LAB — D-DIMER, QUANTITATIVE: D-Dimer, Quant: 0.47 ug/mL-FEU (ref 0.00–0.50)

## 2015-07-21 LAB — BRAIN NATRIURETIC PEPTIDE: B NATRIURETIC PEPTIDE 5: 325.3 pg/mL — AB (ref 0.0–100.0)

## 2015-07-21 MED ORDER — METHYLPREDNISOLONE SODIUM SUCC 125 MG IJ SOLR
125.0000 mg | Freq: Once | INTRAMUSCULAR | Status: AC
  Administered 2015-07-21: 125 mg via INTRAVENOUS
  Filled 2015-07-21: qty 2

## 2015-07-21 MED ORDER — IPRATROPIUM-ALBUTEROL 0.5-2.5 (3) MG/3ML IN SOLN
3.0000 mL | Freq: Once | RESPIRATORY_TRACT | Status: AC
  Administered 2015-07-21: 3 mL via RESPIRATORY_TRACT
  Filled 2015-07-21: qty 3

## 2015-07-21 MED ORDER — FUROSEMIDE 10 MG/ML IJ SOLN
40.0000 mg | Freq: Once | INTRAMUSCULAR | Status: AC
  Administered 2015-07-21: 40 mg via INTRAVENOUS
  Filled 2015-07-21: qty 4

## 2015-07-21 MED ORDER — ALBUTEROL SULFATE (2.5 MG/3ML) 0.083% IN NEBU
2.5000 mg | INHALATION_SOLUTION | Freq: Once | RESPIRATORY_TRACT | Status: AC
  Administered 2015-07-21: 2.5 mg via RESPIRATORY_TRACT
  Filled 2015-07-21: qty 3

## 2015-07-21 NOTE — ED Notes (Signed)
EKG given to EDP,Zammitt,MD., for review. 

## 2015-07-21 NOTE — ED Notes (Signed)
Pt states that she has been SOB x 3-4 days. States she was in a room where bug spray was sprayed and it got worse. O2 75%. Alert and oriented.

## 2015-07-22 ENCOUNTER — Encounter (HOSPITAL_COMMUNITY): Payer: Self-pay | Admitting: Internal Medicine

## 2015-07-22 ENCOUNTER — Inpatient Hospital Stay (HOSPITAL_COMMUNITY): Payer: Self-pay

## 2015-07-22 DIAGNOSIS — R0600 Dyspnea, unspecified: Secondary | ICD-10-CM

## 2015-07-22 DIAGNOSIS — J4520 Mild intermittent asthma, uncomplicated: Secondary | ICD-10-CM | POA: Diagnosis not present

## 2015-07-22 DIAGNOSIS — J9601 Acute respiratory failure with hypoxia: Secondary | ICD-10-CM | POA: Diagnosis not present

## 2015-07-22 DIAGNOSIS — I160 Hypertensive urgency: Secondary | ICD-10-CM | POA: Diagnosis not present

## 2015-07-22 DIAGNOSIS — Z720 Tobacco use: Secondary | ICD-10-CM | POA: Diagnosis present

## 2015-07-22 DIAGNOSIS — D696 Thrombocytopenia, unspecified: Secondary | ICD-10-CM | POA: Diagnosis present

## 2015-07-22 DIAGNOSIS — R0902 Hypoxemia: Secondary | ICD-10-CM | POA: Diagnosis present

## 2015-07-22 DIAGNOSIS — J45909 Unspecified asthma, uncomplicated: Secondary | ICD-10-CM | POA: Diagnosis present

## 2015-07-22 LAB — CBC WITH DIFFERENTIAL/PLATELET
Basophils Absolute: 0 10*3/uL (ref 0.0–0.1)
Basophils Relative: 0 %
Eosinophils Absolute: 0 10*3/uL (ref 0.0–0.7)
Eosinophils Relative: 0 %
HEMATOCRIT: 44.9 % (ref 36.0–46.0)
HEMOGLOBIN: 14.3 g/dL (ref 12.0–15.0)
LYMPHS ABS: 0.9 10*3/uL (ref 0.7–4.0)
LYMPHS PCT: 10 %
MCH: 30.9 pg (ref 26.0–34.0)
MCHC: 31.8 g/dL (ref 30.0–36.0)
MCV: 97 fL (ref 78.0–100.0)
Monocytes Absolute: 0.1 10*3/uL (ref 0.1–1.0)
Monocytes Relative: 1 %
NEUTROS ABS: 8.1 10*3/uL — AB (ref 1.7–7.7)
NEUTROS PCT: 89 %
Platelets: 132 10*3/uL — ABNORMAL LOW (ref 150–400)
RBC: 4.63 MIL/uL (ref 3.87–5.11)
RDW: 13.5 % (ref 11.5–15.5)
WBC: 9.1 10*3/uL (ref 4.0–10.5)

## 2015-07-22 LAB — COMPREHENSIVE METABOLIC PANEL
ALT: 25 U/L (ref 14–54)
AST: 24 U/L (ref 15–41)
Albumin: 4.1 g/dL (ref 3.5–5.0)
Alkaline Phosphatase: 54 U/L (ref 38–126)
Anion gap: 8 (ref 5–15)
BUN: 15 mg/dL (ref 6–20)
CHLORIDE: 98 mmol/L — AB (ref 101–111)
CO2: 33 mmol/L — ABNORMAL HIGH (ref 22–32)
Calcium: 8.9 mg/dL (ref 8.9–10.3)
Creatinine, Ser: 0.99 mg/dL (ref 0.44–1.00)
GFR calc Af Amer: >60 mL/min (ref >60)
Glucose, Bld: 135 mg/dL — ABNORMAL HIGH (ref 65–99)
POTASSIUM: 4.1 mmol/L (ref 3.5–5.1)
Sodium: 139 mmol/L (ref 135–145)
Total Bilirubin: 1 mg/dL (ref 0.3–1.2)
Total Protein: 7.8 g/dL (ref 6.5–8.1)

## 2015-07-22 LAB — TROPONIN I: Troponin I: <0.03 ng/mL (ref <0.031)

## 2015-07-22 LAB — ECHOCARDIOGRAM COMPLETE
Height: 62 in
Weight: 5153.6 oz

## 2015-07-22 LAB — RAPID URINE DRUG SCREEN, HOSP PERFORMED
AMPHETAMINES: NOT DETECTED
BARBITURATES: NOT DETECTED
Benzodiazepines: NOT DETECTED
Cocaine: NOT DETECTED
OPIATES: NOT DETECTED
TETRAHYDROCANNABINOL: NOT DETECTED

## 2015-07-22 LAB — PREGNANCY, URINE: PREG TEST UR: NEGATIVE

## 2015-07-22 LAB — TSH: TSH: 0.563 u[IU]/mL (ref 0.350–4.500)

## 2015-07-22 LAB — MAGNESIUM: Magnesium: 1.9 mg/dL (ref 1.7–2.4)

## 2015-07-22 MED ORDER — HYDRALAZINE HCL 10 MG PO TABS
10.0000 mg | ORAL_TABLET | Freq: Three times a day (TID) | ORAL | Status: DC
Start: 2015-07-22 — End: 2015-07-22
  Administered 2015-07-22: 10 mg via ORAL
  Filled 2015-07-22: qty 1

## 2015-07-22 MED ORDER — ONDANSETRON HCL 4 MG PO TABS: 4.0000 mg | ORAL_TABLET | Freq: Four times a day (QID) | ORAL | Status: DC | PRN

## 2015-07-22 MED ORDER — CARVEDILOL 3.125 MG PO TABS
3.1250 mg | ORAL_TABLET | Freq: Two times a day (BID) | ORAL | Status: DC
  Administered 2015-07-22 – 2015-07-23 (×3): 3.125 mg via ORAL
  Filled 2015-07-22 (×4): qty 1

## 2015-07-22 MED ORDER — ACETAMINOPHEN 650 MG RE SUPP: 650.0000 mg | Freq: Four times a day (QID) | RECTAL | Status: DC | PRN

## 2015-07-22 MED ORDER — ONDANSETRON HCL 4 MG/2ML IJ SOLN: 4.0000 mg | Freq: Four times a day (QID) | INTRAMUSCULAR | Status: DC | PRN

## 2015-07-22 MED ORDER — NITROGLYCERIN 0.4 MG/HR TD PT24
0.4000 mg | MEDICATED_PATCH | Freq: Every day | TRANSDERMAL | Status: DC
  Administered 2015-07-22: 0.4 mg via TRANSDERMAL
  Filled 2015-07-22: qty 1

## 2015-07-22 MED ORDER — ACETAMINOPHEN 325 MG PO TABS
650.0000 mg | ORAL_TABLET | Freq: Four times a day (QID) | ORAL | Status: DC | PRN
  Administered 2015-07-23: 650 mg via ORAL
  Filled 2015-07-22: qty 2

## 2015-07-22 MED ORDER — PREDNISONE 20 MG PO TABS
40.0000 mg | ORAL_TABLET | Freq: Every day | ORAL | Status: DC
  Administered 2015-07-22 – 2015-07-25 (×4): 40 mg via ORAL
  Filled 2015-07-22 (×4): qty 2

## 2015-07-22 MED ORDER — MAGNESIUM SULFATE 2 GM/50ML IV SOLN
2.0000 g | Freq: Once | INTRAVENOUS | Status: AC
  Administered 2015-07-22: 2 g via INTRAVENOUS
  Filled 2015-07-22: qty 50

## 2015-07-22 MED ORDER — FUROSEMIDE 10 MG/ML IJ SOLN
40.0000 mg | Freq: Every day | INTRAMUSCULAR | Status: DC
  Administered 2015-07-22: 40 mg via INTRAVENOUS
  Filled 2015-07-22: qty 4

## 2015-07-22 MED ORDER — ALBUTEROL SULFATE (2.5 MG/3ML) 0.083% IN NEBU: 2.5000 mg | INHALATION_SOLUTION | RESPIRATORY_TRACT | Status: DC | PRN

## 2015-07-22 MED ORDER — SODIUM CHLORIDE 0.9% FLUSH
3.0000 mL | Freq: Two times a day (BID) | INTRAVENOUS | Status: DC
  Administered 2015-07-22 – 2015-07-25 (×8): 3 mL via INTRAVENOUS

## 2015-07-22 MED ORDER — ENOXAPARIN SODIUM 40 MG/0.4ML ~~LOC~~ SOLN
40.0000 mg | SUBCUTANEOUS | Status: DC
Start: 2015-07-22 — End: 2015-07-25
  Administered 2015-07-22 – 2015-07-25 (×4): 40 mg via SUBCUTANEOUS
  Filled 2015-07-22 (×4): qty 0.4

## 2015-07-22 MED ORDER — ENSURE ENLIVE PO LIQD
237.0000 mL | Freq: Two times a day (BID) | ORAL | Status: DC
  Administered 2015-07-22 – 2015-07-25 (×7): 237 mL via ORAL

## 2015-07-22 MED ORDER — HYDRALAZINE HCL 20 MG/ML IJ SOLN
5.0000 mg | Freq: Once | INTRAMUSCULAR | Status: AC
  Administered 2015-07-22: 5 mg via INTRAVENOUS
  Filled 2015-07-22: qty 1

## 2015-07-22 MED ORDER — ALBUTEROL SULFATE (2.5 MG/3ML) 0.083% IN NEBU
2.5000 mg | INHALATION_SOLUTION | RESPIRATORY_TRACT | Status: DC
  Administered 2015-07-22: 2.5 mg via RESPIRATORY_TRACT
  Filled 2015-07-22: qty 3

## 2015-07-22 MED ORDER — PNEUMOCOCCAL VAC POLYVALENT 25 MCG/0.5ML IJ INJ
0.5000 mL | INJECTION | INTRAMUSCULAR | Status: AC
  Administered 2015-07-23: 0.5 mL via INTRAMUSCULAR
  Filled 2015-07-22 (×3): qty 0.5

## 2015-07-22 MED ORDER — ALBUTEROL SULFATE (2.5 MG/3ML) 0.083% IN NEBU
2.5000 mg | INHALATION_SOLUTION | Freq: Three times a day (TID) | RESPIRATORY_TRACT | Status: DC
  Administered 2015-07-22 – 2015-07-23 (×4): 2.5 mg via RESPIRATORY_TRACT
  Filled 2015-07-22 (×4): qty 3

## 2015-07-22 MED ORDER — HYDRALAZINE HCL 25 MG PO TABS
25.0000 mg | ORAL_TABLET | Freq: Three times a day (TID) | ORAL | Status: DC
  Administered 2015-07-22 – 2015-07-25 (×8): 25 mg via ORAL
  Filled 2015-07-22 (×8): qty 1

## 2015-07-22 NOTE — Progress Notes (Signed)
Initial Nutrition Assessment  DOCUMENTATION CODES:   Morbid obesity  INTERVENTION:  -RD to continue to monitor -Continue Ensure Enlive po BID, each supplement provides 350 kcal and 20 grams of protein - d/c with adequate PO intake  NUTRITION DIAGNOSIS:   Inadequate oral intake related to poor appetite, nausea as evidenced by per patient/family report.  GOAL:   Patient will meet greater than or equal to 90% of their needs  MONITOR:   PO intake, I & O's, Labs, Skin  REASON FOR ASSESSMENT:   Malnutrition Screening Tool    ASSESSMENT:   Jennifer Owens is a 45 y.o. female with medical history significant of hypertension and asthma presently on no antihypertensives presents to the ER because of increasing shortness of breath over the last 4 days. Patient's shortness of breath increases on exertion denies any productive cough fever chills or chest pain.  Jennifer Owens is a pleasant 45 yo woman who presents with poor appetite "x1-2 months." She states that she was only eating 1x per day PTA. Depending on the time of day she may have a 'sausage biscuit, or a burger.' She states that she would get nauseated shortly after eating sometimes, which also made her hesitant with PO intake. She endorses wt loss during this time span but is unable to give an amount. No wt's PTA per chart.  Documented PO intake in chart - 80%  She had Frosted Flakes, Jamaica Toast, a Bagel and Peaches this morning that she ate most of. No issues with tolerance, or nausea/vomiting. Did have some nausea yesterday.  Nutrition-Focused physical exam completed. Findings are no fat depletion, no muscle depletion, and no edema.   Labs and Medications reviewed  Diet Order:  Diet Heart Room service appropriate?: Yes; Fluid consistency:: Thin; Fluid restriction:: 1200 mL Fluid  Skin:  Reviewed, no issues  Last BM:  5/25  Height:   Ht Readings from Last 1 Encounters:  07/22/15 5\' 2"  (1.575 m)    Weight:   Wt  Readings from Last 1 Encounters:  07/22/15 322 lb 1.6 oz (146.104 kg)    Ideal Body Weight:  50 kg  BMI:  Body mass index is 58.9 kg/(m^2).  Estimated Nutritional Needs:   Kcal:  1650-1850 (22-25 cal/kg ABW)  Protein:  75-90 grams (1-1.2 g/kg IBW)  Fluid:  >/= 1.65L  EDUCATION NEEDS:   No education needs identified at this time  Dionne Ano. Devontay Celaya, MS, RD LDN Inpatient Clinical Dietitian Pager [phone]

## 2015-07-22 NOTE — H&P (Signed)
History and Physical    Jennifer Owens EXB:[id number] DOB: 11/09/1970 DOA: 07/21/2015  PCP: PROVIDER NOT IN SYSTEM  Patient coming from: Home.  Chief Complaint: Shortness of breath.  HPI: Jennifer Owens is a 45 y.o. female with medical history significant of hypertension and asthma presently on no antihypertensives presents to the ER because of increasing shortness of breath over the last 4 days. Patient's shortness of breath increases on exertion denies any productive cough fever chills or chest pain. The ER patient is found to be having markedly elevated blood pressure and chest x-ray shows congestion concerning for interstitial edema. BNP is around 300. ER physician initially noticed wheezing but on my exam patient's bleeding has resolved. Patient was given nebulizer treatment and steroids and IV Lasix. For blood pressure control I ordered IV hydralazine.   ED Course: IV steroids nebulizer IV Lasix and IV hydralazine.  Review of Systems: As per HPI otherwise 10 point review of systems negative.    Past Medical History  Diagnosis Date  . Sciatica   . Asthma     Past Surgical History  Procedure Laterality Date  . Cholecystectomy    . Appendectomy    . Cesarean section    . Small intestine surgery       reports that she has been smoking.  She does not have any smokeless tobacco history on file. She reports that she drinks alcohol. Her drug history is not on file.  No Known Allergies  Family History  Problem Relation Age of Onset  . Diabetes Mellitus II Mother   . Hypertension Mother   . Diabetes Mellitus II Father     Prior to Admission medications   Medication Sig Start Date End Date Taking? Authorizing Provider  albuterol (PROVENTIL HFA;VENTOLIN HFA) 108 (90 Base) MCG/ACT inhaler Inhale 2 puffs into the lungs every 6 (six) hours as needed for wheezing or shortness of breath.   Yes Historical Provider, MD  cephALEXin (KEFLEX) 500 MG capsule Take 1 capsule (500 mg total)  by mouth 4 (four) times daily. Patient not taking: Reported on 07/21/2015 03/08/15   Roxy Horseman, PA-C  cyclobenzaprine (FLEXERIL) 10 MG tablet Take 1 tablet (10 mg total) by mouth 3 (three) times daily as needed for muscle spasms. Patient not taking: Reported on 07/21/2015 05/08/14   Mercedes Camprubi-Soms, PA-C  HYDROcodone-acetaminophen (NORCO/VICODIN) 5-325 MG tablet Take 1-2 tablets by mouth every 6 (six) hours as needed. Patient not taking: Reported on 07/21/2015 03/08/15   Roxy Horseman, PA-C  naproxen (NAPROSYN) 500 MG tablet Take 1 tablet (500 mg total) by mouth 2 (two) times daily as needed for mild pain, moderate pain or headache (TAKE WITH MEALS.). Patient not taking: Reported on 07/21/2015 05/08/14   Mercedes Camprubi-Soms, PA-C  predniSONE (DELTASONE) 20 MG tablet 3 tabs po daily x 4 days Patient not taking: Reported on 07/21/2015 05/08/14   Mercedes Camprubi-Soms, PA-C  sulfamethoxazole-trimethoprim (BACTRIM DS,SEPTRA DS) 800-160 MG per tablet Take 1 tablet by mouth 2 (two) times daily. Patient not taking: Reported on 07/21/2015 05/08/14   Allen Derry, PA-C    Physical Exam: Filed Vitals:   07/22/15 0143 07/22/15 0148 07/22/15 0201 07/22/15 0210  BP:  160/112 161/116 168/119  Pulse: 97 93 93 93  Temp:      TempSrc:      Resp:   20 20  SpO2: 92% 96% 92% 96%      Constitutional: Not in distress. Filed Vitals:   07/22/15 0143 07/22/15 0148 07/22/15 0201 07/22/15  0210  BP:  160/112 161/116 168/119  Pulse: 97 93 93 93  Temp:      TempSrc:      Resp:   20 20  SpO2: 92% 96% 92% 96%   Eyes: Anicteric no pallor. ENMT: No discharge from the ears eyes nose and mouth. Neck: No mass felt. No JVD appreciated. Respiratory: No rhonchi or crepitations. Cardiovascular: S1 and S2 heard. Abdomen: Soft nontender bowel sounds present. Musculoskeletal: No edema. Skin: No rash. Neurologic: Alert awake oriented to time place and person. Moves all extremities. Psychiatric:  Appears normal.   Labs on Admission: I have personally reviewed following labs and imaging studies  CBC:  Recent Labs Lab 07/21/15 2214 07/21/15 2218  WBC 9.5  --   NEUTROABS 6.4  --   HGB 14.2 15.3*  HCT 42.6 45.0  MCV 95.3  --   PLT 129*  --    Basic Metabolic Panel:  Recent Labs Lab 07/21/15 2218  NA 142  K 4.0  CL 97*  GLUCOSE 98  BUN 13  CREATININE 1.10*   GFR: CrCl cannot be calculated (Unknown ideal weight.). Liver Function Tests: No results for input(s): AST, ALT, ALKPHOS, BILITOT, PROT, ALBUMIN in the last 168 hours. No results for input(s): LIPASE, AMYLASE in the last 168 hours. No results for input(s): AMMONIA in the last 168 hours. Coagulation Profile: No results for input(s): INR, PROTIME in the last 168 hours. Cardiac Enzymes: No results for input(s): CKTOTAL, CKMB, CKMBINDEX, TROPONINI in the last 168 hours. BNP (last 3 results) No results for input(s): PROBNP in the last 8760 hours. HbA1C: No results for input(s): HGBA1C in the last 72 hours. CBG: No results for input(s): GLUCAP in the last 168 hours. Lipid Profile: No results for input(s): CHOL, HDL, LDLCALC, TRIG, CHOLHDL, LDLDIRECT in the last 72 hours. Thyroid Function Tests: No results for input(s): TSH, T4TOTAL, FREET4, T3FREE, THYROIDAB in the last 72 hours. Anemia Panel: No results for input(s): VITAMINB12, FOLATE, FERRITIN, TIBC, IRON, RETICCTPCT in the last 72 hours. Urine analysis:    Component Value Date/Time   COLORURINE YELLOW 05/07/2014 2249   APPEARANCEUR CLOUDY* 05/07/2014 2249   LABSPEC 1.018 05/07/2014 2249   PHURINE 6.5 05/07/2014 2249   GLUCOSEU NEGATIVE 05/07/2014 2249   HGBUR TRACE* 05/07/2014 2249   BILIRUBINUR NEGATIVE 05/07/2014 2249   KETONESUR NEGATIVE 05/07/2014 2249   PROTEINUR 100* 05/07/2014 2249   UROBILINOGEN 1.0 05/07/2014 2249   NITRITE NEGATIVE 05/07/2014 2249   LEUKOCYTESUR MODERATE* 05/07/2014 2249   Sepsis  Labs: @LABRCNTIP (procalcitonin:4,lacticidven:4) )No results found for this or any previous visit (from the past 240 hour(s)).   Radiological Exams on Admission: Dg Chest Port 1 View  07/21/2015  CLINICAL DATA:  Acute onset of shortness of breath. Initial encounter. EXAM: PORTABLE CHEST 1 VIEW COMPARISON:  Chest radiograph performed 01/31/2006 FINDINGS: The lungs are well-aerated. Vascular congestion is noted. Slightly increased interstitial markings could reflect minimal interstitial edema. There is no evidence of pleural effusion or pneumothorax. The cardiomediastinal silhouette is enlarged. No acute osseous abnormalities are seen. IMPRESSION: Vascular congestion and cardiomegaly. Slightly increased interstitial markings could reflect minimal interstitial edema. Electronically Signed   By: Roanna Raider M.D.   On: 07/21/2015 22:35    EKG: Independently reviewed. Sinus tachycardia.  Assessment/Plan Principal Problem:   Acute respiratory failure with hypoxia (HCC) Active Problems:   Hypoxia   Asthma   Thrombocytopenia (HCC)   Hypertensive urgency   Tobacco abuse    #1. Acute respiratory failure with hypoxia - I  think patient main symptoms are from uncontrolled hypertension leading to CHF. For now I have placed patient on when necessary IV hydralazine for systolic blood pressure more than 160, nitroglycerin patch and Lasix 40 mg IV daily and continue as a treatment for asthma but on my exam patient has no active wheezing. Check cardiac markers 2-D echo. If pregnancy screen is negative will start lisinopril. #2. Hypertensive urgency - see #1. Closely follow blood pressure trends. #3. Asthma - presently not bleeding continue as needed nebulizer. I have also placed patient on scheduled nebulizer for now. #4. Thrombocytopenia - appears to be chronic. Patient states he only drinks occasionally. Reviewing her old charts patient has had severe thrombocytopenia previously but patient states she was  never treated for it. Closely follow CBC. #5. Tobacco abuse - advised to quit smoking.   DVT prophylaxis: Lovenox. Code Status: Full code.  Family Communication: No family at the bedside.  Disposition Plan: Home.  Consults called: None.  Admission status: Inpatient. Telemetry. Likely stay 2-3 days.    Eduard Clos MD [facility] Pager [phone].  If 7PM-7AM, please contact night-coverage [email or web address] Password [email or web address]  07/22/2015, 2:18 AM

## 2015-07-22 NOTE — ED Notes (Signed)
MD notified of current blood pressures

## 2015-07-22 NOTE — Progress Notes (Signed)
  Echocardiogram 2D Echocardiogram has been performed.  Jennifer Owens 07/22/2015, 11:42 AM

## 2015-07-22 NOTE — Progress Notes (Signed)
PROGRESS NOTE    Jennifer Owens  ZHY:[id number] DOB: [birth year] DOA: 07/21/2015 PCP: PROVIDER NOT IN SYSTEM    Brief Narrative: Jennifer Owens is a 45 y.o. female with medical history significant of hypertension and asthma presently on no antihypertensives presents to the ER because of increasing shortness of breath over the last 4 days. Patient's shortness of breath increases on exertion denies any productive cough fever chills or chest pain. The ER patient is found to be having markedly elevated blood pressure and chest x-ray shows congestion concerning for interstitial edema. BNP is around 300. ER physician initially noticed wheezing but on my exam patient's bleeding has resolved. Patient was given nebulizer treatment and steroids and IV Lasix. For blood pressure control I ordered IV hydralazine.   ED Course: IV steroids nebulizer IV Lasix and IV hydralazine.   Assessment & Plan:   Principal Problem:   Acute respiratory failure with hypoxia (HCC) Active Problems:   Hypoxia   Asthma   Thrombocytopenia (HCC)   Hypertensive urgency   Tobacco abuse  1-Acute Hypoxic Respiratory Failure; multifactorial Asthma exacerbation, acute diastolic HF exacerbation. , Uncontrolled HTN.   Continue with IV lasix, Nebulizer.  Add oral prednisone.  ECHO consistent with diastolic dysfunction.   2-Asymptomatic VT; replete mg. K normal early this am will re check.  Start low dose coreg.  ECHO normal EF, Troponin negative.   3-HTN urgency; improved. Will add hydralazine oral. Will dc niro-paste.   Thrombocytopenia - appears to be chronic. Patient states he only drinks occasionally. Reviewing her old charts patient has had severe thrombocytopenia previously but patient states she was never treated for it. Closely follow CBC.    DVT prophylaxis: Lovenox.  Code Status: full code.  Family Communication: care discussed with patient.  Disposition Plan: remain in patient.    Consultants:    none   Procedures:  ECHO; Normal LV systolic function; grade 1 diastolic dysfunction; no   significant valvular disease.  Antimicrobials:   none   Subjective: She is feeling better, breathing better. Denies chest pain.   Objective: Filed Vitals:   07/22/15 0304 07/22/15 0403 07/22/15 0615 07/22/15 0929  BP: 156/100  139/79   Pulse: 95  90   Temp: 98.3 F (36.8 C)  98.3 F (36.8 C)   TempSrc: Oral  Oral   Resp: 20  20   Height:  (1.575 m)     Weight: 148.009 kg (326 lb 4.8 oz)  146.104 kg (322 lb 1.6 oz)   SpO2: 93% 94% 93% 94%    Intake/Output Summary (Last 24 hours) at 07/22/15 1206 Last data filed at 07/22/15 1000  Gross per 24 hour  Intake    660 ml  Output   1575 ml  Net   -915 ml   Filed Weights   07/22/15 0304 07/22/15 0615  Weight: 148.009 kg (326 lb 4.8 oz) 146.104 kg (322 lb 1.6 oz)    Examination:  General exam: Appears calm and comfortable  Respiratory system:  Respiratory effort normal. Decrease breath sounds, no wheezing.  Cardiovascular system: S1 & S2 heard, RRR. No JVD, murmurs, rubs, gallops or clicks. No pedal edema. Gastrointestinal system: Abdomen is nondistended, soft and nontender. No organomegaly or masses felt. Normal bowel sounds heard. Central nervous system: Alert and oriented. No focal neurological deficits. Extremities: Symmetric 5 x 5 power. Skin: No rashes, lesions or ulcers Psychiatry: Judgement and insight appear normal. Mood & affect appropriate.     Data Reviewed: I have personally reviewed  following labs and imaging studies  CBC:  Recent Labs Lab 07/21/15 2214 07/21/15 2218 07/22/15 0429  WBC 9.5  --  9.1  NEUTROABS 6.4  --  8.1*  HGB 14.2 15.3* 14.3  HCT 42.6 45.0 44.9  MCV 95.3  --  97.0  PLT 129*  --  132*   Basic Metabolic Panel:  Recent Labs Lab 07/21/15 2218 07/22/15 0429  NA 142 139  K 4.0 4.1  CL 97* 98*  CO2  --  33*  GLUCOSE 98 135*  BUN 13 15  CREATININE 1.10* 0.99  CALCIUM   --  8.9  MG  --  1.9   GFR: Estimated Creatinine Clearance: 100.3 mL/min (by C-G formula based on Cr of 0.99). Liver Function Tests:  Recent Labs Lab 07/22/15 0429  AST 24  ALT 25  ALKPHOS 54  BILITOT 1.0  PROT 7.8  ALBUMIN 4.1   No results for input(s): LIPASE, AMYLASE in the last 168 hours. No results for input(s): AMMONIA in the last 168 hours. Coagulation Profile: No results for input(s): INR, PROTIME in the last 168 hours. Cardiac Enzymes:  Recent Labs Lab 07/22/15 0429 07/22/15 1005  TROPONINI <0.03 <0.03   BNP (last 3 results) No results for input(s): PROBNP in the last 8760 hours. HbA1C: No results for input(s): HGBA1C in the last 72 hours. CBG: No results for input(s): GLUCAP in the last 168 hours. Lipid Profile: No results for input(s): CHOL, HDL, LDLCALC, TRIG, CHOLHDL, LDLDIRECT in the last 72 hours. Thyroid Function Tests:  Recent Labs  07/22/15 0429  TSH 0.563   Anemia Panel: No results for input(s): VITAMINB12, FOLATE, FERRITIN, TIBC, IRON, RETICCTPCT in the last 72 hours. Sepsis Labs:  Recent Labs Lab 07/21/15 2218  LATICACIDVEN 1.07    No results found for this or any previous visit (from the past 240 hour(s)).       Radiology Studies: Dg Chest Port 1 View  07/21/2015  CLINICAL DATA:  Acute onset of shortness of breath. Initial encounter. EXAM: PORTABLE CHEST 1 VIEW COMPARISON:  Chest radiograph performed 01/31/2006 FINDINGS: The lungs are well-aerated. Vascular congestion is noted. Slightly increased interstitial markings could reflect minimal interstitial edema. There is no evidence of pleural effusion or pneumothorax. The cardiomediastinal silhouette is enlarged. No acute osseous abnormalities are seen. IMPRESSION: Vascular congestion and cardiomegaly. Slightly increased interstitial markings could reflect minimal interstitial edema. Electronically Signed   By: Roanna Raider M.D.   On: 07/21/2015 22:35        Scheduled Meds: .  albuterol  2.5 mg Nebulization TID  . enoxaparin (LOVENOX) injection  40 mg Subcutaneous Q24H  . feeding supplement (ENSURE ENLIVE)  237 mL Oral BID BM  . furosemide  40 mg Intravenous Daily  . hydrALAZINE  10 mg Oral Q8H  . [START ON 07/23/2015] pneumococcal 23 valent vaccine  0.5 mL Intramuscular Tomorrow-1000  . predniSONE  40 mg Oral Q breakfast  . sodium chloride flush  3 mL Intravenous Q12H   Continuous Infusions:    LOS: 0 days    Time spent: 35 minutes.     Alba Cory, MD [facility] Pager [phone]  If 7PM-7AM, please contact night-coverage [email or web address] Password [facility] 07/22/2015, 12:06 PM

## 2015-07-22 NOTE — Progress Notes (Signed)
Patient had 8 beats of V-tach this am. Dr Sunnie Nielsen notified and order for Magnesium put in.  Will continue to monitor patient.

## 2015-07-22 NOTE — Progress Notes (Signed)
Patient had 9 beats of V-tach. Dr Sunnie Nielsen notified and new orders noted for MgSO4 IVPB and Coreg (see MAR). . Will continue to monitor patient.   Delford Field, RN

## 2015-07-22 NOTE — ED Provider Notes (Signed)
CSN: [id number]     Arrival date & time 07/21/15  2107 History   First MD Initiated Contact with Patient 07/21/15 2148     Chief Complaint  Patient presents with  . Shortness of Breath     (Consider location/radiation/quality/duration/timing/severity/associated sxs/prior Treatment) Patient is a 45 y.o. female presenting with shortness of breath. The history is provided by the patient (Patient comes planes of shortness of breath wheezing).  Shortness of Breath Severity:  Moderate Onset quality:  Sudden Timing:  Constant Progression:  Worsening Chronicity:  New Context: activity   Relieved by:  Nothing Worsened by:  Nothing tried Ineffective treatments:  None tried Associated symptoms: wheezing   Associated symptoms: no abdominal pain, no chest pain, no cough, no headaches and no rash   Risk factors: no recent alcohol use     Past Medical History  Diagnosis Date  . Sciatica   . Asthma    Past Surgical History  Procedure Laterality Date  . Cholecystectomy    . Appendectomy    . Cesarean section    . Small intestine surgery     No family history on file. Social History  Substance Use Topics  . Smoking status: Current Every Day Smoker  . Smokeless tobacco: Not on file  . Alcohol Use: Yes   OB History    No data available     Review of Systems  Constitutional: Negative for appetite change and fatigue.  HENT: Negative for congestion, ear discharge and sinus pressure.   Eyes: Negative for discharge.  Respiratory: Positive for shortness of breath and wheezing. Negative for cough.   Cardiovascular: Negative for chest pain.  Gastrointestinal: Negative for abdominal pain and diarrhea.  Endocrine: Negative for heat intolerance.  Genitourinary: Negative for frequency and hematuria.  Musculoskeletal: Negative for back pain.  Skin: Negative for rash.  Neurological: Negative for seizures and headaches.  Psychiatric/Behavioral: Negative for hallucinations.       Allergies  Review of patient's allergies indicates no known allergies.  Home Medications   Prior to Admission medications   Medication Sig Start Date End Date Taking? Authorizing Provider  albuterol (PROVENTIL HFA;VENTOLIN HFA) 108 (90 Base) MCG/ACT inhaler Inhale 2 puffs into the lungs every 6 (six) hours as needed for wheezing or shortness of breath.   Yes Historical Provider, MD  cephALEXin (KEFLEX) 500 MG capsule Take 1 capsule (500 mg total) by mouth 4 (four) times daily. Patient not taking: Reported on 07/21/2015 03/08/15   Roxy Horseman, PA-C  cyclobenzaprine (FLEXERIL) 10 MG tablet Take 1 tablet (10 mg total) by mouth 3 (three) times daily as needed for muscle spasms. Patient not taking: Reported on 07/21/2015 05/08/14   Mercedes Camprubi-Soms, PA-C  HYDROcodone-acetaminophen (NORCO/VICODIN) 5-325 MG tablet Take 1-2 tablets by mouth every 6 (six) hours as needed. Patient not taking: Reported on 07/21/2015 03/08/15   Roxy Horseman, PA-C  naproxen (NAPROSYN) 500 MG tablet Take 1 tablet (500 mg total) by mouth 2 (two) times daily as needed for mild pain, moderate pain or headache (TAKE WITH MEALS.). Patient not taking: Reported on 07/21/2015 05/08/14   Mercedes Camprubi-Soms, PA-C  predniSONE (DELTASONE) 20 MG tablet 3 tabs po daily x 4 days Patient not taking: Reported on 07/21/2015 05/08/14   Mercedes Camprubi-Soms, PA-C  sulfamethoxazole-trimethoprim (BACTRIM DS,SEPTRA DS) 800-160 MG per tablet Take 1 tablet by mouth 2 (two) times daily. Patient not taking: Reported on 07/21/2015 05/08/14   Mercedes Camprubi-Soms, PA-C   BP 152/97 mmHg  Pulse 86  Temp(Src) 98 F (36.7  C) (Oral)  Resp 20  SpO2 99%  LMP 05/21/2015 (Approximate) Physical Exam  Constitutional: She is oriented to person, place, and time. She appears well-developed.  HENT:  Head: Normocephalic.  Eyes: Conjunctivae and EOM are normal. No scleral icterus.  Neck: Neck supple. No thyromegaly present.   Cardiovascular: Normal rate and regular rhythm.  Exam reveals no gallop and no friction rub.   No murmur heard. Pulmonary/Chest: No stridor. She has wheezes. She has no rales. She exhibits no tenderness.  Abdominal: She exhibits no distension. There is no tenderness. There is no rebound.  Musculoskeletal: Normal range of motion. She exhibits no edema.  Lymphadenopathy:    She has no cervical adenopathy.  Neurological: She is oriented to person, place, and time. She exhibits normal muscle tone. Coordination normal.  Skin: No rash noted. No erythema.  Psychiatric: She has a normal mood and affect. Her behavior is normal.    ED Course  Procedures (including critical care time) Labs Review Labs Reviewed  CBC WITH DIFFERENTIAL/PLATELET - Abnormal; Notable for the following:    Platelets 129 (*)    All other components within normal limits  BRAIN NATRIURETIC PEPTIDE - Abnormal; Notable for the following:    B Natriuretic Peptide 325.3 (*)    All other components within normal limits  I-STAT CHEM 8, ED - Abnormal; Notable for the following:    Chloride 97 (*)    Creatinine, Ser 1.10 (*)    Hemoglobin 15.3 (*)    All other components within normal limits  D-DIMER, QUANTITATIVE (NOT AT [facility])  I-STAT CG4 LACTIC ACID, ED  Rosezena Sensor, ED  I-STAT CG4 LACTIC ACID, ED    Imaging Review Dg Chest Port 1 View  07/21/2015  CLINICAL DATA:  Acute onset of shortness of breath. Initial encounter. EXAM: PORTABLE CHEST 1 VIEW COMPARISON:  Chest radiograph performed 01/31/2006 FINDINGS: The lungs are well-aerated. Vascular congestion is noted. Slightly increased interstitial markings could reflect minimal interstitial edema. There is no evidence of pleural effusion or pneumothorax. The cardiomediastinal silhouette is enlarged. No acute osseous abnormalities are seen. IMPRESSION: Vascular congestion and cardiomegaly. Slightly increased interstitial markings could reflect minimal interstitial edema.  Electronically Signed   By: Roanna Raider M.D.   On: 07/21/2015 22:35   I have personally reviewed and evaluated these images and lab results as part of my medical decision-making.   EKG Interpretation None     CRITICAL CARE Performed by: Manasvi Dickard L Total critical care time: 40 minutes Critical care time was exclusive of separately billable procedures and treating other patients. Critical care was necessary to treat or prevent imminent or life-threatening deterioration. Critical care was time spent personally by me on the following activities: development of treatment plan with patient and/or surrogate as well as nursing, discussions with consultants, evaluation of patient's response to treatment, examination of patient, obtaining history from patient or surrogate, ordering and performing treatments and interventions, ordering and review of laboratory studies, ordering and review of radiographic studies, pulse oximetry and re-evaluation of patient's condition.  MDM   Final diagnoses:  Hypoxia    Diagnosis hypoxia with asthma and congestive heart failure. Patient improved with neb treatments and Lasix but is still hypoxic and will be admitted to the hospital    Bethann Berkshire, MD 07/22/15 [phone]

## 2015-07-23 DIAGNOSIS — D696 Thrombocytopenia, unspecified: Secondary | ICD-10-CM

## 2015-07-23 DIAGNOSIS — R0902 Hypoxemia: Secondary | ICD-10-CM | POA: Diagnosis not present

## 2015-07-23 DIAGNOSIS — J9601 Acute respiratory failure with hypoxia: Secondary | ICD-10-CM | POA: Diagnosis not present

## 2015-07-23 LAB — BASIC METABOLIC PANEL
Anion gap: 5 (ref 5–15)
BUN: 25 mg/dL — AB (ref 6–20)
CALCIUM: 8.7 mg/dL — AB (ref 8.9–10.3)
CHLORIDE: 94 mmol/L — AB (ref 101–111)
CO2: 37 mmol/L — AB (ref 22–32)
CREATININE: 1.07 mg/dL — AB (ref 0.44–1.00)
GFR calc non Af Amer: >60 mL/min (ref >60)
GLUCOSE: 107 mg/dL — AB (ref 65–99)
Potassium: 5.1 mmol/L (ref 3.5–5.1)
Sodium: 136 mmol/L (ref 135–145)

## 2015-07-23 MED ORDER — ALBUTEROL SULFATE (2.5 MG/3ML) 0.083% IN NEBU
2.5000 mg | INHALATION_SOLUTION | Freq: Two times a day (BID) | RESPIRATORY_TRACT | Status: DC
  Administered 2015-07-24 – 2015-07-25 (×3): 2.5 mg via RESPIRATORY_TRACT
  Filled 2015-07-23 (×3): qty 3

## 2015-07-23 MED ORDER — POLYETHYLENE GLYCOL 3350 17 G PO PACK
17.0000 g | PACK | Freq: Every day | ORAL | Status: DC
  Administered 2015-07-23 – 2015-07-25 (×3): 17 g via ORAL
  Filled 2015-07-23 (×3): qty 1

## 2015-07-23 NOTE — Progress Notes (Signed)
PROGRESS NOTE    Jennifer Owens  ZOX:[id number] DOB: 10/30/1970 DOA: 07/21/2015 PCP: PROVIDER NOT IN SYSTEM    Brief Narrative: Jennifer Owens is a 45 y.o. female with medical history significant of hypertension and asthma presently on no antihypertensives presents to the ER because of increasing shortness of breath over the last 4 days. Patient's shortness of breath increases on exertion denies any productive cough fever chills or chest pain. The ER patient is found to be having markedly elevated blood pressure and chest x-ray shows congestion concerning for interstitial edema. BNP is around 300. ER physician initially noticed wheezing but on my exam patient's bleeding has resolved. Patient was given nebulizer treatment and steroids and IV Lasix. For blood pressure control I ordered IV hydralazine.   ED Course: IV steroids nebulizer IV Lasix and IV hydralazine.   Assessment & Plan:   Principal Problem:   Acute respiratory failure with hypoxia (HCC) Active Problems:   Hypoxia   Asthma   Thrombocytopenia (HCC)   Hypertensive urgency   Tobacco abuse  1-Acute Hypoxic Respiratory Failure; multifactorial Asthma exacerbation, acute diastolic HF exacerbation. , Uncontrolled HTN.   Continue with  Nebulizer, prednisone.  ECHO consistent with diastolic dysfunction.  Hold lasix, due to increase Cr.   2-Asymptomatic VT; replete mg. K normal early this am will re check.  Started low dose coreg. Tolerating it so far ECHO normal EF, Troponin negative.   3-HTN urgency; improved. Continue with hydralazine   Thrombocytopenia - appears to be chronic. Patient states he only drinks occasionally. Reviewing her old charts patient has had severe thrombocytopenia previously but patient states she was never treated for it. Closely follow CBC.  AKI; likely related to diuresis. Hold lasix..   DVT prophylaxis: Lovenox.  Code Status: full code.  Family Communication: care discussed with patient.    Disposition Plan: remain in patient.    Consultants:   none   Procedures:  ECHO; Normal LV systolic function; grade 1 diastolic dysfunction; no   significant valvular disease.  Antimicrobials:   none   Subjective: Breathing better, no wheezing . denies dyspnea on ambulation   Objective: Filed Vitals:   07/22/15 1947 07/22/15 2101 07/22/15 2312 07/23/15 0616  BP:  95/56 120/73 125/66  Pulse:  80 80 82  Temp:  97.7 F (36.5 C)  98.2 F (36.8 C)  TempSrc:  Oral  Oral  Resp:  18  16  Height:      Weight:    146.058 kg (322 lb)  SpO2: 95% 95% 99% 94%    Intake/Output Summary (Last 24 hours) at 07/23/15 1319 Last data filed at 07/23/15 8119  Gross per 24 hour  Intake     50 ml  Output   1050 ml  Net  -1000 ml   Filed Weights   07/22/15 0304 07/22/15 0615 07/23/15 0616  Weight: 148.009 kg (326 lb 4.8 oz) 146.104 kg (322 lb 1.6 oz) 146.058 kg (322 lb)    Examination:  General exam: Appears calm and comfortable  Respiratory system:  Respiratory effort normal. Decrease breath sounds, no wheezing.  Cardiovascular system: S1 & S2 heard, RRR. No JVD, murmurs, rubs, gallops or clicks. No pedal edema. Gastrointestinal system: Abdomen is nondistended, soft and nontender. No organomegaly or masses felt. Normal bowel sounds heard. Central nervous system: Alert and oriented. No focal neurological deficits. Extremities: Symmetric 5 x 5 power. Skin: No rashes, lesions or ulcers Psychiatry: Judgement and insight appear normal. Mood & affect appropriate.  Data Reviewed: I have personally reviewed following labs and imaging studies  CBC:  Recent Labs Lab 07/21/15 2214 07/21/15 2218 07/22/15 0429  WBC 9.5  --  9.1  NEUTROABS 6.4  --  8.1*  HGB 14.2 15.3* 14.3  HCT 42.6 45.0 44.9  MCV 95.3  --  97.0  PLT 129*  --  132*   Basic Metabolic Panel:  Recent Labs Lab 07/21/15 2218 07/22/15 0429 07/23/15 0503  NA 142 139 136  K 4.0 4.1 5.1  CL 97* 98* 94*   CO2  --  33* 37*  GLUCOSE 98 135* 107*  BUN 13 15 25*  CREATININE 1.10* 0.99 1.07*  CALCIUM  --  8.9 8.7*  MG  --  1.9  --    GFR: Estimated Creatinine Clearance: 92.8 mL/min (by C-G formula based on Cr of 1.07). Liver Function Tests:  Recent Labs Lab 07/22/15 0429  AST 24  ALT 25  ALKPHOS 54  BILITOT 1.0  PROT 7.8  ALBUMIN 4.1   No results for input(s): LIPASE, AMYLASE in the last 168 hours. No results for input(s): AMMONIA in the last 168 hours. Coagulation Profile: No results for input(s): INR, PROTIME in the last 168 hours. Cardiac Enzymes:  Recent Labs Lab 07/22/15 0429 07/22/15 1005 07/22/15 1549  TROPONINI <0.03 <0.03 <0.03   BNP (last 3 results) No results for input(s): PROBNP in the last 8760 hours. HbA1C: No results for input(s): HGBA1C in the last 72 hours. CBG: No results for input(s): GLUCAP in the last 168 hours. Lipid Profile: No results for input(s): CHOL, HDL, LDLCALC, TRIG, CHOLHDL, LDLDIRECT in the last 72 hours. Thyroid Function Tests:  Recent Labs  07/22/15 0429  TSH 0.563   Anemia Panel: No results for input(s): VITAMINB12, FOLATE, FERRITIN, TIBC, IRON, RETICCTPCT in the last 72 hours. Sepsis Labs:  Recent Labs Lab 07/21/15 2218  LATICACIDVEN 1.07    No results found for this or any previous visit (from the past 240 hour(s)).       Radiology Studies: Dg Chest Port 1 View  07/21/2015  CLINICAL DATA:  Acute onset of shortness of breath. Initial encounter. EXAM: PORTABLE CHEST 1 VIEW COMPARISON:  Chest radiograph performed 01/31/2006 FINDINGS: The lungs are well-aerated. Vascular congestion is noted. Slightly increased interstitial markings could reflect minimal interstitial edema. There is no evidence of pleural effusion or pneumothorax. The cardiomediastinal silhouette is enlarged. No acute osseous abnormalities are seen. IMPRESSION: Vascular congestion and cardiomegaly. Slightly increased interstitial markings could reflect  minimal interstitial edema. Electronically Signed   By: Roanna Raider M.D.   On: 07/21/2015 22:35        Scheduled Meds: . albuterol  2.5 mg Nebulization TID  . carvedilol  3.125 mg Oral BID WC  . enoxaparin (LOVENOX) injection  40 mg Subcutaneous Q24H  . feeding supplement (ENSURE ENLIVE)  237 mL Oral BID BM  . hydrALAZINE  25 mg Oral Q8H  . polyethylene glycol  17 g Oral Daily  . predniSONE  40 mg Oral Q breakfast  . sodium chloride flush  3 mL Intravenous Q12H   Continuous Infusions:    LOS: 1 day    Time spent: 25 minutes.     Alba Cory, MD [facility] Pager [phone]  If 7PM-7AM, please contact night-coverage [email or web address] Password [email or web address] 07/23/2015, 1:19 PM

## 2015-07-24 DIAGNOSIS — J9601 Acute respiratory failure with hypoxia: Secondary | ICD-10-CM | POA: Diagnosis not present

## 2015-07-24 LAB — BASIC METABOLIC PANEL
ANION GAP: 4 — AB (ref 5–15)
BUN: 27 mg/dL — AB (ref 6–20)
CALCIUM: 8.9 mg/dL (ref 8.9–10.3)
CO2: 39 mmol/L — ABNORMAL HIGH (ref 22–32)
CREATININE: 0.93 mg/dL (ref 0.44–1.00)
Chloride: 97 mmol/L — ABNORMAL LOW (ref 101–111)
GLUCOSE: 91 mg/dL (ref 65–99)
Potassium: 4.8 mmol/L (ref 3.5–5.1)
Sodium: 140 mmol/L (ref 135–145)

## 2015-07-24 LAB — MAGNESIUM: Magnesium: 2.1 mg/dL (ref 1.7–2.4)

## 2015-07-24 LAB — TROPONIN I
Troponin I: <0.03 ng/mL (ref <0.031)
Troponin I: <0.03 ng/mL (ref <0.031)

## 2015-07-24 MED ORDER — METOPROLOL TARTRATE 25 MG PO TABS
12.5000 mg | ORAL_TABLET | Freq: Two times a day (BID) | ORAL | Status: DC
  Administered 2015-07-24 (×2): 12.5 mg via ORAL
  Filled 2015-07-24 (×2): qty 1

## 2015-07-24 MED ORDER — ZOLPIDEM TARTRATE 5 MG PO TABS
5.0000 mg | ORAL_TABLET | Freq: Once | ORAL | Status: AC
  Administered 2015-07-24: 5 mg via ORAL
  Filled 2015-07-24: qty 1

## 2015-07-24 NOTE — Progress Notes (Signed)
PROGRESS NOTE    Jennifer Owens  QJF:[id number] DOB: 1971-02-06 DOA: 07/21/2015 PCP: PROVIDER NOT IN SYSTEM    Brief Narrative: Jennifer Owens is a 45 y.o. female with medical history significant of hypertension and asthma presently on no antihypertensives presents to the ER because of increasing shortness of breath over the last 4 days. Patient's shortness of breath increases on exertion denies any productive cough fever chills or chest pain. The ER patient is found to be having markedly elevated blood pressure and chest x-ray shows congestion concerning for interstitial edema. BNP is around 300. ER physician initially noticed wheezing but on my exam patient's bleeding has resolved. Patient was given nebulizer treatment and steroids and IV Lasix. For blood pressure control I ordered IV hydralazine.   ED Course: IV steroids nebulizer IV Lasix and IV hydralazine.   Assessment & Plan:   Principal Problem:   Acute respiratory failure with hypoxia (HCC) Active Problems:   Hypoxia   Asthma   Thrombocytopenia (HCC)   Hypertensive urgency   Tobacco abuse  1-Acute Hypoxic Respiratory Failure; multifactorial Asthma exacerbation, acute diastolic HF exacerbation. , Uncontrolled HTN.   Continue with  Nebulizer, prednisone.  ECHO consistent with diastolic dysfunction.  Hold lasix, due to increase Cr. Cr trending down.   2-Asymptomatic VT;  Change coreg to metoprolol/  ECHO normal EF, Troponin negative.   3-HTN urgency; improved. Continue with hydralazine   Thrombocytopenia - appears to be chronic. Patient states he only drinks occasionally. Reviewing her old charts patient has had severe thrombocytopenia previously but patient states she was never treated for it. Closely follow CBC.  AKI; likely related to diuresis. Hold lasix.. Improved today   DVT prophylaxis: Lovenox.  Code Status: full code.  Family Communication: care discussed with patient.  Disposition Plan: home tomorrow     Consultants:   none   Procedures:  ECHO; Normal LV systolic function; grade 1 diastolic dysfunction; no   significant valvular disease.  Antimicrobials:   none   Subjective: Feels better , breathing better   Objective: Filed Vitals:   07/24/15 0825 07/24/15 0835 07/24/15 0858 07/24/15 1328  BP:    155/92  Pulse:    74  Temp:    98.4 F (36.9 C)  TempSrc:    Oral  Resp: 18   20  Height:      Weight:  146.92 kg (323 lb 14.4 oz)    SpO2: 91% 95% 90% 95%    Intake/Output Summary (Last 24 hours) at 07/24/15 1340 Last data filed at 07/24/15 1300  Gross per 24 hour  Intake    840 ml  Output   3727 ml  Net  -2887 ml   Filed Weights   07/23/15 0616 07/24/15 0518 07/24/15 0835  Weight: 146.058 kg (322 lb) 147.7 kg (325 lb 9.9 oz) 146.92 kg (323 lb 14.4 oz)    Examination:  General exam: Appears calm and comfortable  Respiratory system:  Respiratory effort normal. Decrease breath sounds, no wheezing.  Cardiovascular system: S1 & S2 heard, RRR. No JVD, murmurs, rubs, gallops or clicks. No pedal edema. Gastrointestinal system: Abdomen is nondistended, soft and nontender. No organomegaly or masses felt. Normal bowel sounds heard. Central nervous system: Alert and oriented. No focal neurological deficits. Extremities: Symmetric 5 x 5 power. Skin: No rashes, lesions or ulcers Psychiatry: Judgement and insight appear normal. Mood & affect appropriate.     Data Reviewed: I have personally reviewed following labs and imaging studies  CBC:  Recent Labs  Lab 07/21/15 2214 07/21/15 2218 07/22/15 0429  WBC 9.5  --  9.1  NEUTROABS 6.4  --  8.1*  HGB 14.2 15.3* 14.3  HCT 42.6 45.0 44.9  MCV 95.3  --  97.0  PLT 129*  --  132*   Basic Metabolic Panel:  Recent Labs Lab 07/21/15 2218 07/22/15 0429 07/23/15 0503 07/24/15 0511 07/24/15 0905  NA 142 139 136 140  --   K 4.0 4.1 5.1 4.8  --   CL 97* 98* 94* 97*  --   CO2  --  33* 37* 39*  --   GLUCOSE 98 135*  107* 91  --   BUN 13 15 25* 27*  --   CREATININE 1.10* 0.99 1.07* 0.93  --   CALCIUM  --  8.9 8.7* 8.9  --   MG  --  1.9  --   --  2.1   GFR: Estimated Creatinine Clearance: 107.1 mL/min (by C-G formula based on Cr of 0.93). Liver Function Tests:  Recent Labs Lab 07/22/15 0429  AST 24  ALT 25  ALKPHOS 54  BILITOT 1.0  PROT 7.8  ALBUMIN 4.1   No results for input(s): LIPASE, AMYLASE in the last 168 hours. No results for input(s): AMMONIA in the last 168 hours. Coagulation Profile: No results for input(s): INR, PROTIME in the last 168 hours. Cardiac Enzymes:  Recent Labs Lab 07/22/15 0429 07/22/15 1005 07/22/15 1549 07/24/15 0905  TROPONINI <0.03 <0.03 <0.03 <0.03   BNP (last 3 results) No results for input(s): PROBNP in the last 8760 hours. HbA1C: No results for input(s): HGBA1C in the last 72 hours. CBG: No results for input(s): GLUCAP in the last 168 hours. Lipid Profile: No results for input(s): CHOL, HDL, LDLCALC, TRIG, CHOLHDL, LDLDIRECT in the last 72 hours. Thyroid Function Tests:  Recent Labs  07/22/15 0429  TSH 0.563   Anemia Panel: No results for input(s): VITAMINB12, FOLATE, FERRITIN, TIBC, IRON, RETICCTPCT in the last 72 hours. Sepsis Labs:  Recent Labs Lab 07/21/15 2218  LATICACIDVEN 1.07    No results found for this or any previous visit (from the past 240 hour(s)).       Radiology Studies: No results found.      Scheduled Meds: . albuterol  2.5 mg Nebulization BID  . enoxaparin (LOVENOX) injection  40 mg Subcutaneous Q24H  . feeding supplement (ENSURE ENLIVE)  237 mL Oral BID BM  . hydrALAZINE  25 mg Oral Q8H  . metoprolol tartrate  12.5 mg Oral BID  . polyethylene glycol  17 g Oral Daily  . predniSONE  40 mg Oral Q breakfast  . sodium chloride flush  3 mL Intravenous Q12H   Continuous Infusions:    LOS: 2 days    Time spent: 25 minutes.     Alba Cory, MD [facility] Pager [phone]  If  7PM-7AM, please contact night-coverage [email or web address] Password [email or web address] 07/24/2015, 1:40 PM

## 2015-07-25 DIAGNOSIS — J9601 Acute respiratory failure with hypoxia: Secondary | ICD-10-CM | POA: Diagnosis not present

## 2015-07-25 LAB — BASIC METABOLIC PANEL
ANION GAP: 8 (ref 5–15)
BUN: 25 mg/dL — ABNORMAL HIGH (ref 6–20)
CHLORIDE: 93 mmol/L — AB (ref 101–111)
CO2: 37 mmol/L — ABNORMAL HIGH (ref 22–32)
CREATININE: 1.1 mg/dL — AB (ref 0.44–1.00)
Calcium: 9.2 mg/dL (ref 8.9–10.3)
GFR calc non Af Amer: 60 mL/min — ABNORMAL LOW (ref >60)
Glucose, Bld: 93 mg/dL (ref 65–99)
POTASSIUM: 4.8 mmol/L (ref 3.5–5.1)
SODIUM: 138 mmol/L (ref 135–145)

## 2015-07-25 MED ORDER — FUROSEMIDE 20 MG PO TABS: 20.0000 mg | ORAL_TABLET | Freq: Every day | ORAL | Status: DC | PRN

## 2015-07-25 MED ORDER — METOPROLOL TARTRATE 25 MG PO TABS: 25.0000 mg | ORAL_TABLET | Freq: Two times a day (BID) | ORAL | Status: DC

## 2015-07-25 MED ORDER — HYDRALAZINE HCL 20 MG/ML IJ SOLN: 10.0000 mg | Freq: Three times a day (TID) | INTRAMUSCULAR | Status: DC | PRN

## 2015-07-25 MED ORDER — METOPROLOL TARTRATE 25 MG PO TABS
25.0000 mg | ORAL_TABLET | Freq: Two times a day (BID) | ORAL | Status: DC
  Administered 2015-07-25: 25 mg via ORAL
  Filled 2015-07-25: qty 1

## 2015-07-25 MED ORDER — PREDNISONE 20 MG PO TABS: 40.0000 mg | ORAL_TABLET | Freq: Every day | ORAL | Status: DC

## 2015-07-25 MED ORDER — ALBUTEROL SULFATE HFA 108 (90 BASE) MCG/ACT IN AERS: 2.0000 | INHALATION_SPRAY | Freq: Four times a day (QID) | RESPIRATORY_TRACT | Status: DC | PRN

## 2015-07-25 MED ORDER — HYDRALAZINE HCL 25 MG PO TABS: 25.0000 mg | ORAL_TABLET | Freq: Three times a day (TID) | ORAL | Status: DC

## 2015-07-25 NOTE — Care Management Note (Signed)
Case Management Note  Patient Details  Name: Jennifer Owens MRN: [id number] Date of Birth: 07/21/1970  Subjective/Objective:        45 yo admitted with Acute respiratory failure.            Action/Plan: From home with family.  CM consult for med assistance.  Pt given [facility] packet for PCP and med assist follow up.  This CM reviewed all prescriptions pt to dc on and all scripts are on the $4 list at [facility] except for 20mg  of prednisone. Coupon from [email or web address] given for prednisone.  Pt appreciative of CM assistance.  No other CM needs communicated.  Expected Discharge Date:                  Expected Discharge Plan:  Home/Self Care  In-House Referral:     Discharge planning Services  CM Consult, Indigent Health Clinic, Medication Assistance  Post Acute Care Choice:    Choice offered to:     DME Arranged:    DME Agency:     HH Arranged:    HH Agency:     Status of Service:  Completed, signed off  Medicare Important Message Given:    Date Medicare IM Given:    Medicare IM give by:    Date Additional Medicare IM Given:    Additional Medicare Important Message give by:     If discussed at Long Length of Stay Meetings, dates discussed:    Additional CommentsBartholome Bill, RN 07/25/2015, 11:19 AM  [phone]

## 2015-07-25 NOTE — Discharge Summary (Addendum)
Physician Discharge Summary  Jennifer Owens:[id number] DOB: [birth year] DOA: 07/21/2015  PCP: PROVIDER NOT IN SYSTEM  Admit date: 07/21/2015 Discharge date: 07/25/2015  Time spent: 35 minutes  Recommendations for Outpatient Follow-up:  Needs B-met to follow renal function Further adjustment of BP medications.  Weight loss.    Discharge Diagnoses:    Acute respiratory failure with hypoxia (HCC)   Acute diastolic HF exacerbation    Hypoxia   Asthma   Thrombocytopenia (HCC)   Hypertensive malignancy    Tobacco abuse   Discharge Condition: stable  Diet recommendation: heart healthy   Filed Weights   07/24/15 0518 07/24/15 0835 07/25/15 0540  Weight: 147.7 kg (325 lb 9.9 oz) 146.92 kg (323 lb 14.4 oz) 145.922 kg (321 lb 11.2 oz)    History of present illness:  Jennifer Owens is a 45 y.o. female with medical history significant of hypertension and asthma presently on no antihypertensives presents to the ER because of increasing shortness of breath over the last 4 days. Patient's shortness of breath increases on exertion denies any productive cough fever chills or chest pain. The ER patient is found to be having markedly elevated blood pressure and chest x-ray shows congestion concerning for interstitial edema. BNP is around 300. ER physician initially noticed wheezing but on my exam patient's bleeding has resolved. Patient was given nebulizer treatment and steroids and IV Lasix. For blood pressure control I ordered IV hydralazine.   ED Course: IV steroids nebulizer IV Lasix and IV hydralazine.  Hospital Course:  1-Acute Hypoxic Respiratory Failure; multifactorial Asthma exacerbation, acute diastolic HF exacerbation. , Uncontrolled HTN.  Continue with Nebulizer, prednisone.  ECHO consistent with diastolic dysfunction.  Hold lasix, due to increase Cr. Cr trending down.  Advised to take lasix daily as needed for weight gain more than 2 pound in 24 hours.  Short course of  prednisone taper.   2-Asymptomatic VT;  Change coreg to metoprolol/  ECHO normal EF, Troponin negative.   3-HTN urgency; improved. Continue with hydralazine , metoprolol.  Thrombocytopenia - appears to be chronic. Patient states he only drinks occasionally. Reviewing her old charts patient has had severe thrombocytopenia previously but patient states she was never treated for it. Closely follow CBC.  AKI; likely related to diuresis. Hold lasix.. Stable.   Procedures:  none  Consultations:  none  Discharge Exam: Filed Vitals:   07/25/15 0842 07/25/15 0910  BP: 142/81   Pulse: 93 89  Temp:    Resp:  18    General: NAD Cardiovascular: S 1, S 2 RRR Respiratory: CTA  Discharge Instructions   Discharge Instructions    Diet - low sodium heart healthy    Complete by:  As directed      Increase activity slowly    Complete by:  As directed           Discharge Medication List as of 07/25/2015 11:14 AM    START taking these medications   Details  furosemide (LASIX) 20 MG tablet Take 1 tablet (20 mg total) by mouth daily as needed. Take one tablet if you gain more than 2 pounds in 24 hours., Starting 07/25/2015, Until Discontinued, Print    hydrALAZINE (APRESOLINE) 25 MG tablet Take 1 tablet (25 mg total) by mouth every 8 (eight) hours., Starting 07/25/2015, Until Discontinued, Print    metoprolol tartrate (LOPRESSOR) 25 MG tablet Take 1 tablet (25 mg total) by mouth 2 (two) times daily., Starting 07/25/2015, Until Discontinued, Print  CONTINUE these medications which have CHANGED   Details  predniSONE (DELTASONE) 20 MG tablet Take 2 tablets (40 mg total) by mouth daily with breakfast., Starting 07/25/2015, Until Discontinued, Print      CONTINUE these medications which have NOT CHANGED   Details  albuterol (PROVENTIL HFA;VENTOLIN HFA) 108 (90 Base) MCG/ACT inhaler Inhale 2 puffs into the lungs every 6 (six) hours as needed for wheezing or shortness of breath., Until  Discontinued, Historical Med      STOP taking these medications     cephALEXin (KEFLEX) 500 MG capsule      cyclobenzaprine (FLEXERIL) 10 MG tablet      HYDROcodone-acetaminophen (NORCO/VICODIN) 5-325 MG tablet      naproxen (NAPROSYN) 500 MG tablet      sulfamethoxazole-trimethoprim (BACTRIM DS,SEPTRA DS) 800-160 MG per tablet        No Known Allergies Follow-up Information    Follow up with [facility].   Why:  please call for an appointment   Contact information:   [address] [place] [address] [phone]      Follow up with [facility].   Why:  please call for an appointment if [facility] do not have any appointments.   Contact information:   [address] [place] [address]        The results of significant diagnostics from this hospitalization (including imaging, microbiology, ancillary and laboratory) are listed below for reference.    Significant Diagnostic Studies: Dg Chest Port 1 View  07/21/2015  CLINICAL DATA:  Acute onset of shortness of breath. Initial encounter. EXAM: PORTABLE CHEST 1 VIEW COMPARISON:  Chest radiograph performed 01/31/2006 FINDINGS: The lungs are well-aerated. Vascular congestion is noted. Slightly increased interstitial markings could reflect minimal interstitial edema. There is no evidence of pleural effusion or pneumothorax. The cardiomediastinal silhouette is enlarged. No acute osseous abnormalities are seen. IMPRESSION: Vascular congestion and cardiomegaly. Slightly increased interstitial markings could reflect minimal interstitial edema. Electronically Signed   By: Roanna Raider M.D.   On: 07/21/2015 22:35    Microbiology: No results found for this or any previous visit (from the past 240 hour(s)).   Labs: Basic Metabolic Panel:  Recent Labs Lab 07/21/15 2218 07/22/15 0429 07/23/15 0503 07/24/15 0511  07/24/15 0905 07/25/15 0402  NA 142 139 136 140  --  138  K 4.0 4.1 5.1 4.8  --  4.8  CL 97* 98* 94* 97*  --  93*  CO2  --  33* 37* 39*  --  37*  GLUCOSE 98 135* 107* 91  --  93  BUN 13 15 25* 27*  --  25*  CREATININE 1.10* 0.99 1.07* 0.93  --  1.10*  CALCIUM  --  8.9 8.7* 8.9  --  9.2  MG  --  1.9  --   --  2.1  --    Liver Function Tests:  Recent Labs Lab 07/22/15 0429  AST 24  ALT 25  ALKPHOS 54  BILITOT 1.0  PROT 7.8  ALBUMIN 4.1   No results for input(s): LIPASE, AMYLASE in the last 168 hours. No results for input(s): AMMONIA in the last 168 hours. CBC:  Recent Labs Lab 07/21/15 2214 07/21/15 2218 07/22/15 0429  WBC 9.5  --  9.1  NEUTROABS 6.4  --  8.1*  HGB 14.2 15.3* 14.3  HCT 42.6 45.0 44.9  MCV 95.3  --  97.0  PLT  129*  --  132*   Cardiac Enzymes:  Recent Labs Lab 07/22/15 1005 07/22/15 1549 07/24/15 0905 07/24/15 1420 07/24/15 1955  TROPONINI <0.03 <0.03 <0.03 <0.03 <0.03   BNP: BNP (last 3 results)  Recent Labs  07/21/15 2214  BNP 325.3*    ProBNP (last 3 results) No results for input(s): PROBNP in the last 8760 hours.  CBG: No results for input(s): GLUCAP in the last 168 hours.     Signed:  Elmarie Shiley MD.  [facility] 07/25/2015, 7:57 PM

## 2015-08-17 ENCOUNTER — Inpatient Hospital Stay (HOSPITAL_COMMUNITY)
Admission: EM | Admit: 2015-08-17 | Discharge: 2015-08-31 | DRG: 286 | Disposition: A | Discharge status: Home Health | Payer: Self-pay | Attending: Internal Medicine | Admitting: Internal Medicine

## 2015-08-17 ENCOUNTER — Encounter (HOSPITAL_COMMUNITY): Payer: Self-pay | Admitting: Emergency Medicine

## 2015-08-17 ENCOUNTER — Emergency Department (HOSPITAL_COMMUNITY): Payer: Self-pay

## 2015-08-17 ENCOUNTER — Other Ambulatory Visit: Payer: Self-pay

## 2015-08-17 DIAGNOSIS — G4710 Hypersomnia, unspecified: Secondary | ICD-10-CM | POA: Diagnosis present

## 2015-08-17 DIAGNOSIS — Z79899 Other long term (current) drug therapy: Secondary | ICD-10-CM

## 2015-08-17 DIAGNOSIS — D649 Anemia, unspecified: Secondary | ICD-10-CM | POA: Diagnosis present

## 2015-08-17 DIAGNOSIS — E875 Hyperkalemia: Secondary | ICD-10-CM | POA: Diagnosis not present

## 2015-08-17 DIAGNOSIS — I472 Ventricular tachycardia: Secondary | ICD-10-CM | POA: Diagnosis not present

## 2015-08-17 DIAGNOSIS — R748 Abnormal levels of other serum enzymes: Secondary | ICD-10-CM | POA: Diagnosis present

## 2015-08-17 DIAGNOSIS — J15211 Pneumonia due to Methicillin susceptible Staphylococcus aureus: Secondary | ICD-10-CM | POA: Diagnosis not present

## 2015-08-17 DIAGNOSIS — N179 Acute kidney failure, unspecified: Secondary | ICD-10-CM | POA: Diagnosis present

## 2015-08-17 DIAGNOSIS — E162 Hypoglycemia, unspecified: Secondary | ICD-10-CM | POA: Diagnosis not present

## 2015-08-17 DIAGNOSIS — I2781 Cor pulmonale (chronic): Secondary | ICD-10-CM | POA: Diagnosis present

## 2015-08-17 DIAGNOSIS — J1520 Pneumonia due to staphylococcus, unspecified: Secondary | ICD-10-CM | POA: Insufficient documentation

## 2015-08-17 DIAGNOSIS — Z833 Family history of diabetes mellitus: Secondary | ICD-10-CM

## 2015-08-17 DIAGNOSIS — H1130 Conjunctival hemorrhage, unspecified eye: Secondary | ICD-10-CM | POA: Diagnosis present

## 2015-08-17 DIAGNOSIS — R0902 Hypoxemia: Secondary | ICD-10-CM | POA: Diagnosis present

## 2015-08-17 DIAGNOSIS — Z8249 Family history of ischemic heart disease and other diseases of the circulatory system: Secondary | ICD-10-CM

## 2015-08-17 DIAGNOSIS — I272 Other secondary pulmonary hypertension: Secondary | ICD-10-CM | POA: Diagnosis present

## 2015-08-17 DIAGNOSIS — Y95 Nosocomial condition: Secondary | ICD-10-CM | POA: Diagnosis not present

## 2015-08-17 DIAGNOSIS — Z720 Tobacco use: Secondary | ICD-10-CM | POA: Diagnosis present

## 2015-08-17 DIAGNOSIS — I429 Cardiomyopathy, unspecified: Secondary | ICD-10-CM | POA: Diagnosis present

## 2015-08-17 DIAGNOSIS — J9690 Respiratory failure, unspecified, unspecified whether with hypoxia or hypercapnia: Secondary | ICD-10-CM | POA: Diagnosis present

## 2015-08-17 DIAGNOSIS — Z789 Other specified health status: Secondary | ICD-10-CM

## 2015-08-17 DIAGNOSIS — J9602 Acute respiratory failure with hypercapnia: Secondary | ICD-10-CM | POA: Insufficient documentation

## 2015-08-17 DIAGNOSIS — Z452 Encounter for adjustment and management of vascular access device: Secondary | ICD-10-CM

## 2015-08-17 DIAGNOSIS — J9622 Acute and chronic respiratory failure with hypercapnia: Secondary | ICD-10-CM | POA: Insufficient documentation

## 2015-08-17 DIAGNOSIS — E872 Acidosis: Secondary | ICD-10-CM | POA: Diagnosis not present

## 2015-08-17 DIAGNOSIS — I493 Ventricular premature depolarization: Secondary | ICD-10-CM | POA: Diagnosis present

## 2015-08-17 DIAGNOSIS — F17210 Nicotine dependence, cigarettes, uncomplicated: Secondary | ICD-10-CM | POA: Diagnosis present

## 2015-08-17 DIAGNOSIS — I959 Hypotension, unspecified: Secondary | ICD-10-CM | POA: Diagnosis not present

## 2015-08-17 DIAGNOSIS — I5043 Acute on chronic combined systolic (congestive) and diastolic (congestive) heart failure: Secondary | ICD-10-CM | POA: Diagnosis present

## 2015-08-17 DIAGNOSIS — J9600 Acute respiratory failure, unspecified whether with hypoxia or hypercapnia: Secondary | ICD-10-CM

## 2015-08-17 DIAGNOSIS — I5033 Acute on chronic diastolic (congestive) heart failure: Secondary | ICD-10-CM

## 2015-08-17 DIAGNOSIS — G9341 Metabolic encephalopathy: Secondary | ICD-10-CM | POA: Diagnosis present

## 2015-08-17 DIAGNOSIS — Z6841 Body Mass Index (BMI) 40.0 and over, adult: Secondary | ICD-10-CM

## 2015-08-17 DIAGNOSIS — G4733 Obstructive sleep apnea (adult) (pediatric): Secondary | ICD-10-CM | POA: Diagnosis present

## 2015-08-17 DIAGNOSIS — I5042 Chronic combined systolic (congestive) and diastolic (congestive) heart failure: Secondary | ICD-10-CM | POA: Insufficient documentation

## 2015-08-17 DIAGNOSIS — I509 Heart failure, unspecified: Secondary | ICD-10-CM

## 2015-08-17 DIAGNOSIS — J9601 Acute respiratory failure with hypoxia: Secondary | ICD-10-CM | POA: Insufficient documentation

## 2015-08-17 DIAGNOSIS — I119 Hypertensive heart disease without heart failure: Secondary | ICD-10-CM | POA: Diagnosis present

## 2015-08-17 DIAGNOSIS — J9621 Acute and chronic respiratory failure with hypoxia: Secondary | ICD-10-CM | POA: Diagnosis present

## 2015-08-17 DIAGNOSIS — I110 Hypertensive heart disease with heart failure: Principal | ICD-10-CM | POA: Diagnosis present

## 2015-08-17 DIAGNOSIS — J45909 Unspecified asthma, uncomplicated: Secondary | ICD-10-CM | POA: Diagnosis present

## 2015-08-17 DIAGNOSIS — D696 Thrombocytopenia, unspecified: Secondary | ICD-10-CM | POA: Diagnosis present

## 2015-08-17 HISTORY — DX: Thrombocytopenia, unspecified: D69.6

## 2015-08-17 HISTORY — DX: Tobacco use: Z72.0

## 2015-08-17 HISTORY — DX: Morbid (severe) obesity due to excess calories: E66.01

## 2015-08-17 LAB — BASIC METABOLIC PANEL
ANION GAP: 4 — AB (ref 5–15)
BUN: 15 mg/dL (ref 6–20)
CALCIUM: 8.6 mg/dL — AB (ref 8.9–10.3)
CO2: 34 mmol/L — ABNORMAL HIGH (ref 22–32)
Chloride: 102 mmol/L (ref 101–111)
Creatinine, Ser: 1.12 mg/dL — ABNORMAL HIGH (ref 0.44–1.00)
GFR, EST NON AFRICAN AMERICAN: 58 mL/min — AB (ref >60)
GLUCOSE: 83 mg/dL (ref 65–99)
POTASSIUM: 4.3 mmol/L (ref 3.5–5.1)
Sodium: 140 mmol/L (ref 135–145)

## 2015-08-17 LAB — I-STAT TROPONIN, ED: Troponin i, poc: 0.04 ng/mL (ref 0.00–0.08)

## 2015-08-17 LAB — CBC
HCT: 43 % (ref 36.0–46.0)
HEMOGLOBIN: 13.6 g/dL (ref 12.0–15.0)
MCH: 31 pg (ref 26.0–34.0)
MCHC: 31.6 g/dL (ref 30.0–36.0)
MCV: 97.9 fL (ref 78.0–100.0)
Platelets: 107 10*3/uL — ABNORMAL LOW (ref 150–400)
RBC: 4.39 MIL/uL (ref 3.87–5.11)
RDW: 13.8 % (ref 11.5–15.5)
WBC: 7.2 10*3/uL (ref 4.0–10.5)

## 2015-08-17 LAB — BRAIN NATRIURETIC PEPTIDE: B NATRIURETIC PEPTIDE 5: 589.6 pg/mL — AB (ref 0.0–100.0)

## 2015-08-17 MED ORDER — IPRATROPIUM-ALBUTEROL 0.5-2.5 (3) MG/3ML IN SOLN
3.0000 mL | Freq: Once | RESPIRATORY_TRACT | Status: AC
  Administered 2015-08-17: 3 mL via RESPIRATORY_TRACT
  Filled 2015-08-17: qty 3

## 2015-08-17 MED ORDER — FUROSEMIDE 10 MG/ML IJ SOLN
40.0000 mg | Freq: Once | INTRAMUSCULAR | Status: AC
  Administered 2015-08-17: 40 mg via INTRAVENOUS
  Filled 2015-08-17: qty 4

## 2015-08-17 MED ORDER — ALBUTEROL SULFATE HFA 108 (90 BASE) MCG/ACT IN AERS: 2.0000 | INHALATION_SPRAY | Freq: Four times a day (QID) | RESPIRATORY_TRACT | Status: DC | PRN

## 2015-08-17 MED ORDER — ALBUTEROL SULFATE (2.5 MG/3ML) 0.083% IN NEBU: 2.5000 mg | INHALATION_SOLUTION | Freq: Four times a day (QID) | RESPIRATORY_TRACT | Status: DC | PRN

## 2015-08-17 MED ORDER — ACETAMINOPHEN 500 MG PO TABS
1000.0000 mg | ORAL_TABLET | Freq: Four times a day (QID) | ORAL | Status: DC | PRN
  Administered 2015-08-17: 1000 mg via ORAL
  Filled 2015-08-17: qty 2

## 2015-08-17 MED ORDER — LISINOPRIL 10 MG PO TABS: 10.0000 mg | ORAL_TABLET | Freq: Every day | ORAL | Status: DC

## 2015-08-17 MED ORDER — METOPROLOL SUCCINATE ER 25 MG PO TB24
25.0000 mg | ORAL_TABLET | Freq: Every day | ORAL | Status: DC
  Administered 2015-08-17 – 2015-08-18 (×2): 25 mg via ORAL
  Filled 2015-08-17 (×2): qty 1

## 2015-08-17 MED ORDER — HYDRALAZINE HCL 20 MG/ML IJ SOLN
10.0000 mg | INTRAMUSCULAR | Status: DC | PRN
  Filled 2015-08-17: qty 1

## 2015-08-17 MED ORDER — FUROSEMIDE 20 MG PO TABS
10.0000 mg | ORAL_TABLET | Freq: Every day | ORAL | Status: DC
  Administered 2015-08-18: 10 mg via ORAL
  Filled 2015-08-17: qty 1

## 2015-08-17 MED ORDER — LISINOPRIL 20 MG PO TABS
20.0000 mg | ORAL_TABLET | Freq: Every day | ORAL | Status: DC
  Administered 2015-08-17: 20 mg via ORAL
  Filled 2015-08-17: qty 1

## 2015-08-17 NOTE — ED Notes (Signed)
Pt ambulated to restroom without O2. Pt came back SOB, Pulse Ox reached 80. Placed pt on 2L, reached 92

## 2015-08-17 NOTE — ED Notes (Signed)
Placed on room air.

## 2015-08-17 NOTE — ED Notes (Addendum)
Pt reports ongoing SOB for the past few weeks. Was admitted for same recently. O2 sat decreased upon arrival. Pt put on 2L [place] in triage which brought O2 sat to 95%. Also reports HA

## 2015-08-17 NOTE — ED Notes (Signed)
PA at bedside.

## 2015-08-17 NOTE — H&P (Signed)
History and Physical    Jennifer Owens IYM:[id number] DOB: 1970/08/13 DOA: 08/17/2015  Referring MD/NP/PA: None PCP: Does not have one at the moment, has not seen Dr. Molli Hazard yet (has appt for July) Outpatient Specialists: None Patient coming from: Home  Chief Complaint: SOB  HPI: Jennifer Owens is a 45 y.o. female with medical history significant of recent diagnosis gradfe 1 diastolic heart failure on recent hospitalization.  Discharged from [facility] on May 29 and generally feeling well until about 4 days ago.  She began feeling sluggish like the previous presentation.  Symptoms worse when outside and in the humidity - significant difficulty catching her breath.  Would feel nauseated.  Symptoms ongoing the last 4 days.  Today, she woke up and turned off the [facility] in the house (had also turned off her fan in her room last night).  Went from bed to bathroom and was completely exhausted.  With these symptoms, she decided to come back in.  No chest pain/pressure other than following late-night eating a few days ago which led to indigestion.  Does experience cough when she gets very hot or feels congested.  +LE edema.  Has not obtained a scale for daily weights - she was told to take Lasix if her weight increased more than 2 pounds/day.  She did take the Lasix a couple of times and she did notice improvement, but she didn't take more (last took last night) because she was noticing some cramping of legs/fingers/abdomen.  Also has been trying to fluid restrict and so concerned that she wasn't taking in enough fluid.  Does note ongoing nausea following meals, and this is new for her.  ED Course: O2 dependent even after Lasix while ambulating; given Lasix 40 mg and Duoneb  Review of Systems: As per HPI otherwise 10 point review of systems reviewed and negative.   Past Medical History  Diagnosis Date  . Sciatica   . Asthma   . Diastolic dysfunction with heart failure (HCC)   . Morbid obesity (HCC)   .  Thrombocytopenia (HCC)   . Malignant hypertension   . Tobacco abuse     Past Surgical History  Procedure Laterality Date  . Cholecystectomy    . Appendectomy    . Cesarean section    . Small intestine surgery       reports that she has been smoking Cigarettes.  She has a 2.5 pack-year smoking history. She has never used smokeless tobacco. She reports that she drinks about 0.6 oz of alcohol per week. She reports that she does not use illicit drugs.  No Known Allergies  Family History  Problem Relation Age of Onset  . Diabetes Mellitus II Mother   . Hypertension Mother   . Diabetes Mellitus II Father      Prior to Admission medications   Medication Sig Start Date End Date Taking? Authorizing Provider  acetaminophen (TYLENOL) 500 MG tablet Take 1,000 mg by mouth every 6 (six) hours as needed for moderate pain or headache.   Yes Historical Provider, MD  albuterol (PROVENTIL HFA;VENTOLIN HFA) 108 (90 Base) MCG/ACT inhaler Inhale 2 puffs into the lungs every 6 (six) hours as needed for wheezing or shortness of breath. 07/25/15  Yes Belkys A Regalado, MD  furosemide (LASIX) 20 MG tablet Take 1 tablet (20 mg total) by mouth daily as needed. Take one tablet if you gain more than 2 pounds in 24 hours. 07/25/15  Yes Belkys A Regalado, MD  hydrALAZINE (APRESOLINE) 25 MG  tablet Take 1 tablet (25 mg total) by mouth every 8 (eight) hours. 07/25/15  Yes Belkys A Regalado, MD  metoprolol tartrate (LOPRESSOR) 25 MG tablet Take 1 tablet (25 mg total) by mouth 2 (two) times daily. 07/25/15  Yes Belkys A Regalado, MD  predniSONE (DELTASONE) 20 MG tablet Take 2 tablets (40 mg total) by mouth daily with breakfast. Patient not taking: Reported on 08/17/2015 07/25/15   Alba Cory, MD    Physical Exam: Filed Vitals:   08/17/15 1800 08/17/15 1830 08/17/15 1949 08/17/15 1950  BP: 149/98 161/103 142/99   Pulse:  86 90   Temp:   98.3 F (36.8 C)   TempSrc:      Resp: 14 21    Height:    5\' 3"  (1.6 m)   Weight:    148.372 kg (327 lb 1.6 oz)  SpO2:  92% 96%       Constitutional: NAD, calm, comfortable Filed Vitals:   08/17/15 1800 08/17/15 1830 08/17/15 1949 08/17/15 1950  BP: 149/98 161/103 142/99   Pulse:  86 90   Temp:   98.3 F (36.8 C)   TempSrc:      Resp: 14 21    Height:    5\' 3"  (1.6 m)  Weight:    148.372 kg (327 lb 1.6 oz)  SpO2:  92% 96%    Gen: Appears calm and comfortable at rest but is short in stature and morbidly obese and does get SOB with minimal exertion; normal O2 sats even with SOB during evaluation Eyes: PERRL, lids and conjunctivae normal ENMT: Mucous membranes are moist. Posterior pharynx clear of any exudate or lesions.Normal dentition.  Neck: normal, supple, no masses, no thyromegaly Respiratory: clear to auscultation bilaterally, no wheezing, no crackles. Normal respiratory effort. No accessory muscle use.  Cardiovascular: Regular rate and rhythm, distant heart sounds. Minimal extremity edema, primarily dependent (one leg hanging off bed with 1+ edema, none on the extremity on the bed). 2+ pedal pulses. No carotid bruits.  Abdomen: no tenderness, no masses palpated. No hepatosplenomegaly. Bowel sounds positive.  Musculoskeletal: no clubbing / cyanosis. No joint deformity upper and lower extremities. Good ROM, no contractures. Normal muscle tone.  Skin: no rashes, lesions, ulcers. No induration Neurologic: CN 2-12 grossly intact. Sensation intact, DTR normal. Strength 5/5 in all 4.  Psychiatric: Normal judgment and insight. Alert and oriented x 3. Normal mood.     Labs on Admission: I have personally reviewed following labs and imaging studies  CBC:  Recent Labs Lab 08/17/15 1600  WBC 7.2  HGB 13.6  HCT 43.0  MCV 97.9  PLT 107*   Basic Metabolic Panel:  Recent Labs Lab 08/17/15 1600  NA 140  K 4.3  CL 102  CO2 34*  GLUCOSE 83  BUN 15  CREATININE 1.12*  CALCIUM 8.6*   GFR: Estimated Creatinine Clearance: 90.9 mL/min (by C-G  formula based on Cr of 1.12). Liver Function Tests: No results for input(s): AST, ALT, ALKPHOS, BILITOT, PROT, ALBUMIN in the last 168 hours. No results for input(s): LIPASE, AMYLASE in the last 168 hours. No results for input(s): AMMONIA in the last 168 hours. Coagulation Profile: No results for input(s): INR, PROTIME in the last 168 hours. Cardiac Enzymes: No results for input(s): CKTOTAL, CKMB, CKMBINDEX, TROPONINI in the last 168 hours. BNP (last 3 results) No results for input(s): PROBNP in the last 8760 hours. HbA1C: No results for input(s): HGBA1C in the last 72 hours. CBG: No results for input(s): GLUCAP  in the last 168 hours. Lipid Profile: No results for input(s): CHOL, HDL, LDLCALC, TRIG, CHOLHDL, LDLDIRECT in the last 72 hours. Thyroid Function Tests: No results for input(s): TSH, T4TOTAL, FREET4, T3FREE, THYROIDAB in the last 72 hours. Anemia Panel: No results for input(s): VITAMINB12, FOLATE, FERRITIN, TIBC, IRON, RETICCTPCT in the last 72 hours. Urine analysis:    Component Value Date/Time   COLORURINE YELLOW 05/07/2014 2249   APPEARANCEUR CLOUDY* 05/07/2014 2249   LABSPEC 1.018 05/07/2014 2249   PHURINE 6.5 05/07/2014 2249   GLUCOSEU NEGATIVE 05/07/2014 2249   HGBUR TRACE* 05/07/2014 2249   BILIRUBINUR NEGATIVE 05/07/2014 2249   KETONESUR NEGATIVE 05/07/2014 2249   PROTEINUR 100* 05/07/2014 2249   UROBILINOGEN 1.0 05/07/2014 2249   NITRITE NEGATIVE 05/07/2014 2249   LEUKOCYTESUR MODERATE* 05/07/2014 2249   Sepsis Labs: @LABRCNTIP (procalcitonin:4,lacticidven:4) )No results found for this or any previous visit (from the past 240 hour(s)).   Radiological Exams on Admission: Dg Chest 2 View  08/17/2015  CLINICAL DATA:  Shortness of breath for several weeks, question CHF, history asthma and smoking EXAM: CHEST  2 VIEW COMPARISON:  07/21/2015 FINDINGS: Enlargement of cardiac silhouette with pulmonary vascular congestion. Mediastinal contour stable. No definite  acute infiltrate, pleural effusion or pneumothorax. Bones unremarkable. IMPRESSION: Enlargement of cardiac silhouette with pulmonary vascular congestion. No definite acute infiltrate or pulmonary edema. Electronically Signed   By: Ulyses Southward M.D.   On: 08/17/2015 15:11    EKG: Independently reviewed. Sinus tachycardia (rate 104), PVCs noted, poor R wave progression but not concerning for acute ischemia  Assessment/Plan Principal Problem:   CHF exacerbation (HCC) Active Problems:   Hypoxia   Asthma   Thrombocytopenia (HCC)   Tobacco abuse   Essential hypertension   1. CHF exacerbation - HFpEF with recent Echo which showed grade 1 diastolic dysfunction.  She has not been able to follow up with a PCM and ran out of medications, which is likely contributing to her readmission.  Will initiate heart failure pathway with CM consult to help facilitate improved outpatient care (if possible).  Meanwhile, her medication regimen was more difficult than it has to be (hydralazine multiple times daily and twice daily BB) and so I have changed these medications to a more simplified regimen to include an ACEI (Lisinopril 20 mg daily plus Toprol XL 25 mg daily).  Will provide hydralazine IV for prn use while hospitalized.  Additionally, her Lasix was prescribed on a prn basis and suspect that she needs this medication daily instead; she was given 40 mg IV in the ER which should be sufficient given her mild increase in BNP plus mild pulmonary congestion on CXR and so will initiate 10 mg PO for daily use and follow.  She may require at least one more IV dose on a prn basis depending on her clinical course.  2. Hypoxia - Suspect that this is multifactorial.  Certainly the CHF/diastolic dysfunction is a contributor and also her asthma, but based on her height and weight, she is very likely also suffering from Obesity-Hypoventilation.  Would suggest outpatient referral for consideration of bariatric surgery, and I have  discussed this with the patient and she is in agreement with this plan.  3. Asthma - Continue home Albuterol MDI and also prn Duonebs.  4. Thrombocytopenia of uncertain etiology.  Mildly worse than on prior visit.  Would consider outpatient hematology evaluation.  5. Tobacco Dependence: encourage cessation.  This was discussed with the patient and should be reviewed on an ongoing basis.  6. HTN - See above.  Change regimen to ACE/BB and use prn hydralazine.  Patient compliance is an issue here, as she did run out of her medications following the last admission.  DVT prophylaxis: SCDs - based on borderline platelets and mild LE edema Code Status: Full Family Communication: None  Disposition Plan: Home, likely tomorrow if improving with current therapy and no longer hypoxic with ambulation Consults called: None but suggest outpatient bariatric surgery and hematology consults Admission status: Observation   Jonah Blue MD [facility] Pager [phone]  If 7PM-7AM, please contact night-coverage [email or web address] Password [facility]  08/17/2015, 8:18 PM

## 2015-08-17 NOTE — Progress Notes (Signed)
ED CM obtained pt an appt updated ED PA/NP Updated pt  Entered in d/c instructions Jennifer Maroon, FNP Go on 08/24/2015 You have a scheduled appointment on September 23 2015 at 0900 with Julianne Handler NP [address] [place] [address] [phone]

## 2015-08-17 NOTE — ED Provider Notes (Signed)
CSN: [id number]     Arrival date & time 08/17/15  1334 History   First MD Initiated Contact with Patient 08/17/15 1545     Chief Complaint  Patient presents with  . Shortness of Breath   (Consider location/radiation/quality/duration/timing/severity/associated sxs/prior Treatment) HPI 45 y.o. female with a hx of HTN, Asthma, Diastolic CHF, presents to the Emergency Department today complaining of worsening shortness of breath for the past 3 days. Notes shortness of breath for the past few weeks with admission on 07-22-15 due to Acute Diastolic CHF. Echo with grade 1 diastolic dysfunction. DCed on 07-25-15. Improved with lasix, prednisone and breathing treatments. Notes fatigue with exertion and rest. No fevers. No CP/ABD pain. Does have mild headache. Pain 7/10. Throbbing bilaterally. No visual changes. No N/V/D. Pt attempted to find PCP post admission, but unable to find one. Ran out of Metoprolol because she was given 30 day supply. Takes Lasix as needed. As not taken Lasix since last Wednesday. Rxed 20 mg QD. No other symptoms noted.   Past Medical History  Diagnosis Date  . Sciatica   . Asthma    Past Surgical History  Procedure Laterality Date  . Cholecystectomy    . Appendectomy    . Cesarean section    . Small intestine surgery     Family History  Problem Relation Age of Onset  . Diabetes Mellitus II Mother   . Hypertension Mother   . Diabetes Mellitus II Father    Social History  Substance Use Topics  . Smoking status: Current Every Day Smoker  . Smokeless tobacco: None  . Alcohol Use: Yes     Comment: Occasionally.   OB History    No data available     Review of Systems ROS reviewed and all are negative for acute change except as noted in the HPI.  Allergies  Review of patient's allergies indicates no known allergies.  Home Medications   Prior to Admission medications   Medication Sig Start Date End Date Taking? Authorizing Provider  acetaminophen (TYLENOL) 500  MG tablet Take 1,000 mg by mouth every 6 (six) hours as needed for moderate pain or headache.   Yes Historical Provider, MD  albuterol (PROVENTIL HFA;VENTOLIN HFA) 108 (90 Base) MCG/ACT inhaler Inhale 2 puffs into the lungs every 6 (six) hours as needed for wheezing or shortness of breath. 07/25/15  Yes Belkys A Regalado, MD  furosemide (LASIX) 20 MG tablet Take 1 tablet (20 mg total) by mouth daily as needed. Take one tablet if you gain more than 2 pounds in 24 hours. 07/25/15  Yes Belkys A Regalado, MD  hydrALAZINE (APRESOLINE) 25 MG tablet Take 1 tablet (25 mg total) by mouth every 8 (eight) hours. 07/25/15  Yes Belkys A Regalado, MD  metoprolol tartrate (LOPRESSOR) 25 MG tablet Take 1 tablet (25 mg total) by mouth 2 (two) times daily. 07/25/15  Yes Belkys A Regalado, MD  predniSONE (DELTASONE) 20 MG tablet Take 2 tablets (40 mg total) by mouth daily with breakfast. Patient not taking: Reported on 08/17/2015 07/25/15   Belkys A Regalado, MD   BP 146/89 mmHg  Pulse 94  Temp(Src) 98.6 F (37 C) (Oral)  Resp 18  Ht 5\' 3"  (1.6 m)  Wt 146.512 kg  BMI 57.23 kg/m2  SpO2 96%  LMP 05/21/2015 (Approximate)   Physical Exam  Constitutional: She is oriented to person, place, and time. She appears well-developed and well-nourished.  HENT:  Head: Normocephalic and atraumatic.  Eyes: EOM are normal. Pupils are  equal, round, and reactive to light.  Neck: Normal range of motion. Neck supple.  Cardiovascular: Normal rate, regular rhythm, normal heart sounds and intact distal pulses.   No murmur heard. Pulmonary/Chest: Effort normal. No accessory muscle usage. No respiratory distress. She has no decreased breath sounds. She has wheezes in the right upper field and the left upper field. She has no rhonchi. She has no rales. She exhibits no tenderness.  On 2L O2 [place]  Abdominal: Soft. There is no tenderness.  Musculoskeletal: Normal range of motion.  1+ bilateral pitting edema. Neurovascularly intact.    Neurological: She is alert and oriented to person, place, and time.  Skin: Skin is warm and dry.  Psychiatric: She has a normal mood and affect. Her behavior is normal. Thought content normal.  Nursing note and vitals reviewed.  ED Course  Procedures (including critical care time) Labs Review Labs Reviewed  CBC - Abnormal; Notable for the following:    Platelets 107 (*)    All other components within normal limits  BRAIN NATRIURETIC PEPTIDE - Abnormal; Notable for the following:    B Natriuretic Peptide 589.6 (*)    All other components within normal limits  BASIC METABOLIC PANEL - Abnormal; Notable for the following:    CO2 34 (*)    Creatinine, Ser 1.12 (*)    Calcium 8.6 (*)    GFR calc non Af Amer 58 (*)    Anion gap 4 (*)    All other components within normal limits  I-STAT TROPOININ, ED   Imaging Review Dg Chest 2 View  08/17/2015  CLINICAL DATA:  Shortness of breath for several weeks, question CHF, history asthma and smoking EXAM: CHEST  2 VIEW COMPARISON:  07/21/2015 FINDINGS: Enlargement of cardiac silhouette with pulmonary vascular congestion. Mediastinal contour stable. No definite acute infiltrate, pleural effusion or pneumothorax. Bones unremarkable. IMPRESSION: Enlargement of cardiac silhouette with pulmonary vascular congestion. No definite acute infiltrate or pulmonary edema. Electronically Signed   By: Ulyses Southward M.D.   On: 08/17/2015 15:11   I have personally reviewed and evaluated these images and lab results as part of my medical decision-making.   EKG Interpretation   Date/Time:  Wednesday August 17 2015 13:44:33 EDT Ventricular Rate:  104 PR Interval:    QRS Duration: 98 QT Interval:  358 QTC Calculation: 471 R Axis:   -89 Text Interpretation:  Sinus tachycardia Ventricular premature complex Left  anterior fascicular block Abnormal R-wave progression, late transition  Confirmed by Juleen China  MD, STEPHEN ([id number]) on 08/17/2015 1:52:31 PM Also  confirmed by  Juleen China  MD, STEPHEN ([id number]), editor Wandalee Ferdinand [phone])  on  08/17/2015 3:19:50 PM      MDM  I have reviewed and evaluated the relevant laboratory valuesI have reviewed and evaluated the relevant imaging studies. I have interpreted the relevant EKG.I have reviewed the relevant previous healthcare records.I obtained HPI from historian. Patient discussed with supervising physician  ED Course:  Assessment: Pt is a 44yF with hx HTn, Asthma, Diastolic CHF who presents with worsening SOB x 3 days with symptoms present for several weeks. Recent admission for diastolic CHF. Improved with lasix and HTN control. On exam, pt in NAD. Nontoxic/nonseptic appearing. VS stable. Requires 2L [place] O2 and sat at 96%. Afebrile. Lungs with bilateral wheeze. Heart RRR. Abdomen nontender soft. CN evaluated and unreamrkable. CBC/BMP otherwise unremarkable. BNP 589. iStat Trop negative. EKG without acute abnormalities. CXR pulmonary vascular congestion. Given lasix . Given duoneb with resolution of wheezing. Ambulated in  ED with O2 saturation in low 80s. 88 at rest on RA. Discussed with supervising physician. Plan is to Admit to medicine for CHF exacerbation.    Disposition/Plan:  Admit Pt acknowledges and agrees with plan  Supervising Physician Raeford Razor, MD   Final diagnoses:  Acute on chronic diastolic congestive heart failure [facility])     Audry Pili, PA-C 08/17/15 1826  Raeford Razor, MD 08/19/15 2159

## 2015-08-18 ENCOUNTER — Observation Stay (HOSPITAL_COMMUNITY): Payer: Self-pay

## 2015-08-18 ENCOUNTER — Encounter (HOSPITAL_COMMUNITY): Payer: Self-pay | Admitting: Anesthesiology

## 2015-08-18 DIAGNOSIS — J9690 Respiratory failure, unspecified, unspecified whether with hypoxia or hypercapnia: Secondary | ICD-10-CM | POA: Diagnosis present

## 2015-08-18 DIAGNOSIS — I5033 Acute on chronic diastolic (congestive) heart failure: Secondary | ICD-10-CM

## 2015-08-18 DIAGNOSIS — J4520 Mild intermittent asthma, uncomplicated: Secondary | ICD-10-CM

## 2015-08-18 DIAGNOSIS — J9602 Acute respiratory failure with hypercapnia: Secondary | ICD-10-CM

## 2015-08-18 LAB — BASIC METABOLIC PANEL
ANION GAP: 5 (ref 5–15)
BUN: 17 mg/dL (ref 6–20)
CALCIUM: 8.4 mg/dL — AB (ref 8.9–10.3)
CO2: 35 mmol/L — ABNORMAL HIGH (ref 22–32)
Chloride: 99 mmol/L — ABNORMAL LOW (ref 101–111)
Creatinine, Ser: 1.42 mg/dL — ABNORMAL HIGH (ref 0.44–1.00)
GFR calc Af Amer: 51 mL/min — ABNORMAL LOW (ref >60)
GFR calc non Af Amer: 44 mL/min — ABNORMAL LOW (ref >60)
GLUCOSE: 106 mg/dL — AB (ref 65–99)
Potassium: 4.4 mmol/L (ref 3.5–5.1)
SODIUM: 139 mmol/L (ref 135–145)

## 2015-08-18 LAB — BLOOD GAS, ARTERIAL
Drawn by: 331471
O2 Content: 6 L/min
O2 Saturation: 90.8 %
PH ART: 7.103 — AB (ref 7.350–7.450)
PO2 ART: 75.8 mmHg — AB (ref 80.0–100.0)
Patient temperature: 98.6

## 2015-08-18 LAB — PHOSPHORUS: PHOSPHORUS: 6.9 mg/dL — AB (ref 2.5–4.6)

## 2015-08-18 LAB — LACTIC ACID, PLASMA
LACTIC ACID, VENOUS: 1.1 mmol/L (ref 0.5–2.0)
LACTIC ACID, VENOUS: 0.7 mmol/L (ref 0.5–2.0)

## 2015-08-18 LAB — CBC
HCT: 44.9 % (ref 36.0–46.0)
Hemoglobin: 13 g/dL (ref 12.0–15.0)
MCH: 30.5 pg (ref 26.0–34.0)
MCHC: 29 g/dL — AB (ref 30.0–36.0)
MCV: 105.4 fL — ABNORMAL HIGH (ref 78.0–100.0)
PLATELETS: 118 10*3/uL — AB (ref 150–400)
RBC: 4.26 MIL/uL (ref 3.87–5.11)
RDW: 13.9 % (ref 11.5–15.5)
WBC: 11.4 10*3/uL — ABNORMAL HIGH (ref 4.0–10.5)

## 2015-08-18 LAB — MRSA PCR SCREENING: MRSA BY PCR: NEGATIVE

## 2015-08-18 LAB — MAGNESIUM: MAGNESIUM: 2 mg/dL (ref 1.7–2.4)

## 2015-08-18 LAB — GLUCOSE, CAPILLARY: Glucose-Capillary: 97 mg/dL (ref 65–99)

## 2015-08-18 LAB — PROCALCITONIN: Procalcitonin: <0.1 ng/mL

## 2015-08-18 MED ORDER — SODIUM CHLORIDE 0.9 % IV BOLUS (SEPSIS)
1000.0000 mL | Freq: Once | INTRAVENOUS | Status: AC
  Administered 2015-08-18: 1000 mL via INTRAVENOUS

## 2015-08-18 MED ORDER — BUDESONIDE 0.5 MG/2ML IN SUSP
0.5000 mg | Freq: Two times a day (BID) | RESPIRATORY_TRACT | Status: DC
  Administered 2015-08-18 – 2015-08-31 (×26): 0.5 mg via RESPIRATORY_TRACT
  Filled 2015-08-18 (×25): qty 2

## 2015-08-18 MED ORDER — DEXTROSE-NACL 5-0.45 % IV SOLN
INTRAVENOUS | Status: DC
  Administered 2015-08-18: 30 mL via INTRAVENOUS

## 2015-08-18 MED ORDER — SODIUM CHLORIDE 0.9 % IV SOLN
250.0000 mL | INTRAVENOUS | Status: DC | PRN
  Administered 2015-08-19: 250 mL via INTRAVENOUS

## 2015-08-18 MED ORDER — INSULIN ASPART 100 UNIT/ML ~~LOC~~ SOLN
0.0000 [IU] | SUBCUTANEOUS | Status: DC
  Administered 2015-08-20 – 2015-08-25 (×3): 3 [IU] via SUBCUTANEOUS

## 2015-08-18 MED ORDER — ONDANSETRON HCL 4 MG/2ML IJ SOLN
4.0000 mg | Freq: Four times a day (QID) | INTRAMUSCULAR | Status: DC
  Administered 2015-08-18: 4 mg via INTRAVENOUS

## 2015-08-18 MED ORDER — HEPARIN SODIUM (PORCINE) 5000 UNIT/ML IJ SOLN
5000.0000 [IU] | Freq: Three times a day (TID) | INTRAMUSCULAR | Status: DC
  Administered 2015-08-18 – 2015-08-21 (×7): 5000 [IU] via SUBCUTANEOUS
  Filled 2015-08-18 (×7): qty 1

## 2015-08-18 MED ORDER — IPRATROPIUM-ALBUTEROL 0.5-2.5 (3) MG/3ML IN SOLN
3.0000 mL | RESPIRATORY_TRACT | Status: DC
  Administered 2015-08-18 – 2015-08-19 (×4): 3 mL via RESPIRATORY_TRACT
  Filled 2015-08-18 (×4): qty 3

## 2015-08-18 MED ORDER — HYDRALAZINE HCL 25 MG PO TABS
25.0000 mg | ORAL_TABLET | Freq: Three times a day (TID) | ORAL | Status: DC
  Administered 2015-08-18: 25 mg via ORAL
  Filled 2015-08-18: qty 1

## 2015-08-18 MED ORDER — ALBUTEROL SULFATE (2.5 MG/3ML) 0.083% IN NEBU
2.5000 mg | INHALATION_SOLUTION | RESPIRATORY_TRACT | Status: DC | PRN
  Filled 2015-08-18: qty 3

## 2015-08-18 MED ORDER — CETYLPYRIDINIUM CHLORIDE 0.05 % MT LIQD
7.0000 mL | Freq: Two times a day (BID) | OROMUCOSAL | Status: DC
  Administered 2015-08-18 – 2015-08-26 (×14): 7 mL via OROMUCOSAL

## 2015-08-18 MED ORDER — ONDANSETRON HCL 4 MG/2ML IJ SOLN: 4.0000 mg | Freq: Four times a day (QID) | INTRAMUSCULAR | Status: DC | PRN

## 2015-08-18 NOTE — Progress Notes (Addendum)
Patient became lethargic.  Suspect that patient has obesity/hypoventilation syndrome versus undiagnosed sleep apnea. ABG obtained showing pH 7.103, pCO2 cannot be obtained.  Spoke with PCCM, Dr. Arsenio Loader.  Recommended intubation instead of BiPAP trial. Patient awake at this time.  Anesthesia consulted for intubation, however recommended BiPAP trial.  Patient will be transferred to ICU.  BiPAP order placed.

## 2015-08-18 NOTE — Progress Notes (Signed)
Patient complaining of nausea and has been slightly lethargic today, but more so this afternoon.  Patient's family member present at bedside expressing concern that this is not normal for patient.  Patient 02 also dropping to 85% on 3L [place] while asleep.  Have placed on 6L and patient is at 90% (while asleep).  Have informed Dr.  Catha Gosselin and placed order for ABG.  Patient is arousable but nods to sleep during conversation.  Will call respiratory and continue to monitor closely.

## 2015-08-18 NOTE — Progress Notes (Signed)
Rt placed pt on BIPAP/NIV in ICU due to ABG.

## 2015-08-18 NOTE — Progress Notes (Signed)
eLink Physician-Brief Progress Note Patient Name: Jennifer Owens DOB: [birth year] MRN: [id number]   Date of Service  08/18/2015  HPI/Events of Note  ABG on BiPAP - 14/7 = 7.09/unable to measure/80.4. Respiratory therapy has already increased the IPAP.  eICU Interventions  Will increase the back up rate to 20 and follow clinically.     Intervention Category Major Interventions: Respiratory failure - evaluation and management  Sommer,Steven Eugene 08/18/2015, 9:22 PM

## 2015-08-18 NOTE — Progress Notes (Signed)
eLink Physician-Brief Progress Note Patient Name: Jennifer Owens DOB: 12-19-1970 MRN: [id number]   Date of Service  08/18/2015  HPI/Events of Note  Multiple issues: 1. Oliguria and 2. Hypotension - BP = 83/46.  eICU Interventions  Will bolus with 0.9 NaCl 1 liter IV over 1 hour now.      Intervention Category Major Interventions: Acute renal failure - evaluation and management;Hypotension - evaluation and management Intermediate Interventions: Oliguria - evaluation and management  Sommer,Steven Eugene 08/18/2015, 10:13 PM

## 2015-08-18 NOTE — Progress Notes (Signed)
PROGRESS NOTE    Jennifer Owens  ZOX:[id number] DOB: 04-Apr-1970 DOA: 08/17/2015 PCP: PROVIDER NOT IN SYSTEM   Brief Narrative:  HPI on 08/17/2015 by Dr. Jonah Blue Jennifer Owens is a 45 y.o. female with medical history significant of recent diagnosis gradfe 1 diastolic heart failure on recent hospitalization. Discharged from [facility] on May 29 and generally feeling well until about 4 days ago. She began feeling sluggish like the previous presentation. Symptoms worse when outside and in the humidity - significant difficulty catching her breath. Would feel nauseated. Symptoms ongoing the last 4 days. Today, she woke up and turned off the [facility] in the house (had also turned off her fan in her room last night). Went from bed to bathroom and was completely exhausted. With these symptoms, she decided to come back in. No chest pain/pressure other than following late-night eating a few days ago which led to indigestion. Does experience cough when she gets very hot or feels congested. +LE edema. Has not obtained a scale for daily weights - she was told to take Lasix if her weight increased more than 2 pounds/day. She did take the Lasix a couple of times and she did notice improvement, but she didn't take more (last took last night) because she was noticing some cramping of legs/fingers/abdomen. Also has been trying to fluid restrict and so concerned that she wasn't taking in enough fluid. Does note ongoing nausea following meals, and this is new for her.  Assessment & Plan   Acute Diastolic CHF exacerbation -Patient recently admitted and discharged 07/25/2015. She was discharged with Lasix to be used when necessary and if weight gain >2lbs in 24hrs -CXR: Enlargement cardiac silhouette with pulmonary vascular congestion. No definite acute infiltrate pulmonary edema -BNP 589.6 -Echocardiogram 07/22/2015 showed EF of 55-60%, grade 1 diastolic dysfunction -Patient was given IV Lasix 40 mg 1 dose in the  emergency department, and placed on by mouth Lasix -Continue monitor intake and output, daily weights  Acute hypoxia -Upon admission, SpO2 85% -Likely multifactorial, including CHF versus undiagnosed sleep apnea -Patient will need outpatient sleep study  Acute renal insufficiency -Creatinine currently 1.42, bump likely secondary to diuresis and lisinopril -Will continue low dose oral lasix  -lisinopril discontinued -Monitor BMP  Essential Hypertension Continue Toprol, Lasix -Home hydralazine was discontinued, patient was placed on lisinopril -Discontinue lisinopril due to renal insufficiency -BP currently controlled   Asthma -Does not appear to be exacerbated at this time -Continue albuterol, nebs PRN  Thrombocytopenia, chronic -Unknown etiology -No evidence of bleeding -Continue to monitor CBC  Tobacco Abuse -Discuss smoking cessation   Morbid obesity -Weight 327 pounds, BMI of 58 -Upon discharge, patient should discuss weight loss and lifestyle modifications with her primary care physician  DVT Prophylaxis  SCDs  Code Status: Full  Family Communication: None at bedside  Disposition Plan: Admitted for observation  Consultants None  Procedures  None  Antibiotics   Anti-infectives    None      Subjective:   Jennifer Owens seen and examined today.  Patient feels her breathing has mildly improved, continues to feel some tightness. Denies chest pain, cough, abdominal pain, nausea vomiting, dizziness or headache.  Objective:   Filed Vitals:   08/17/15 1949 08/17/15 1950 08/18/15 0458 08/18/15 0813  BP: 142/99  115/80 103/59  Pulse: 90  80 79  Temp: 98.3 F (36.8 C)  97.7 F (36.5 C) 98.1 F (36.7 C)  TempSrc:   Oral Oral  Resp:   20 20  Height:   (1.6 m)    Weight:  148.372 kg (327 lb 1.6 oz) 148.598 kg (327 lb 9.6 oz)   SpO2: 96%  100% 95%    Intake/Output Summary (Last 24 hours) at 08/18/15 1255 Last data filed at 08/18/15 0500  Gross per  24 hour  Intake      0 ml  Output    200 ml  Net   -200 ml   Filed Weights   08/17/15 1525 08/17/15 1950 08/18/15 0458  Weight: 146.512 kg (323 lb) 148.372 kg (327 lb 1.6 oz) 148.598 kg (327 lb 9.6 oz)    Exam  General: Well developed, well nourished, NAD  HEENT: NCAT,  mucous membranes moist.   Cardiovascular: S1 S2 auscultated, no murmurs, RRR  Respiratory: Clear to auscultation bilaterally, no crackles  Abdomen: Soft, Obese, nontender, nondistended, + bowel sounds  Extremities: warm dry without cyanosis clubbing. Mild LE edema  Neuro: AAOx3, nonfocal  Psych: Normal affect and demeanor    Data Reviewed: I have personally reviewed following labs and imaging studies  CBC:  Recent Labs Lab 08/17/15 1600  WBC 7.2  HGB 13.6  HCT 43.0  MCV 97.9  PLT 107*   Basic Metabolic Panel:  Recent Labs Lab 08/17/15 1600 08/18/15 0347  NA 140 139  K 4.3 4.4  CL 102 99*  CO2 34* 35*  GLUCOSE 83 106*  BUN 15 17  CREATININE 1.12* 1.42*  CALCIUM 8.6* 8.4*   GFR: Estimated Creatinine Clearance: 71.8 mL/min (by C-G formula based on Cr of 1.42). Liver Function Tests: No results for input(s): AST, ALT, ALKPHOS, BILITOT, PROT, ALBUMIN in the last 168 hours. No results for input(s): LIPASE, AMYLASE in the last 168 hours. No results for input(s): AMMONIA in the last 168 hours. Coagulation Profile: No results for input(s): INR, PROTIME in the last 168 hours. Cardiac Enzymes: No results for input(s): CKTOTAL, CKMB, CKMBINDEX, TROPONINI in the last 168 hours. BNP (last 3 results) No results for input(s): PROBNP in the last 8760 hours. HbA1C: No results for input(s): HGBA1C in the last 72 hours. CBG: No results for input(s): GLUCAP in the last 168 hours. Lipid Profile: No results for input(s): CHOL, HDL, LDLCALC, TRIG, CHOLHDL, LDLDIRECT in the last 72 hours. Thyroid Function Tests: No results for input(s): TSH, T4TOTAL, FREET4, T3FREE, THYROIDAB in the last 72  hours. Anemia Panel: No results for input(s): VITAMINB12, FOLATE, FERRITIN, TIBC, IRON, RETICCTPCT in the last 72 hours. Urine analysis:    Component Value Date/Time   COLORURINE YELLOW 05/07/2014 2249   APPEARANCEUR CLOUDY* 05/07/2014 2249   LABSPEC 1.018 05/07/2014 2249   PHURINE 6.5 05/07/2014 2249   GLUCOSEU NEGATIVE 05/07/2014 2249   HGBUR TRACE* 05/07/2014 2249   BILIRUBINUR NEGATIVE 05/07/2014 2249   KETONESUR NEGATIVE 05/07/2014 2249   PROTEINUR 100* 05/07/2014 2249   UROBILINOGEN 1.0 05/07/2014 2249   NITRITE NEGATIVE 05/07/2014 2249   LEUKOCYTESUR MODERATE* 05/07/2014 2249   Sepsis Labs: (procalcitonin:4,lacticidven:4)  )No results found for this or any previous visit (from the past 240 hour(s)).    Radiology Studies: Dg Chest 2 View  08/17/2015  CLINICAL DATA:  Shortness of breath for several weeks, question CHF, history asthma and smoking EXAM: CHEST  2 VIEW COMPARISON:  07/21/2015 FINDINGS: Enlargement of cardiac silhouette with pulmonary vascular congestion. Mediastinal contour stable. No definite acute infiltrate, pleural effusion or pneumothorax. Bones unremarkable. IMPRESSION: Enlargement of cardiac silhouette with pulmonary vascular congestion. No definite acute infiltrate or pulmonary edema. Electronically Signed   By:  Ulyses Southward M.D.   On: 08/17/2015 15:11     Scheduled Meds: . furosemide  10 mg Oral Daily  . metoprolol succinate  25 mg Oral Daily   Continuous Infusions:      Time Spent in minutes   30 minutes  Emonte Dieujuste D.O. on 08/18/2015 at 12:55 PM  Between 7am to 7pm - Pager - [phone]  After 7pm go to [email or web address] - password [email or web address]  And look for the night coverage person covering for me after hours  [facility] Office  [phone]

## 2015-08-18 NOTE — Progress Notes (Signed)
Post ABG draw- RT placed PT on 55% VM- best Sp02 = 83%. RT then placed PT on PRB, best Sp02= 95%. RN remains at bedside.

## 2015-08-18 NOTE — Consult Note (Signed)
PULMONARY / CRITICAL CARE MEDICINE   Name: Jennifer Owens MRN: [id number] DOB: [birth year]    ADMISSION DATE:  08/17/2015 CONSULTATION DATE:  08/18/15  REFERRING MD:  TRH  CHIEF COMPLAINT:  SOB  HISTORY OF PRESENT ILLNESS:  Jennifer Owens is a 45 y.o. female with PMH as outlined below including grade 1 DD from Echo May 2017.  She began to have generalized weakness / ill feeling roughly 4 days ago (6/18).  Symptoms progressed to SOB and more "sluggish" feeling, exacerbated whenever she was outside in the hot humidity.  On 6/21, she became exhausted just walking from her bed to the bathroom.  She also had increase in LE edema; however, no known weight gain as she has not yet purchased a scale to weight herself (she had previously been instructed to take lasix if she gained more than 2 lbs in one day). This prompted her to go to [facility] ED for further evaluation.    In ED, she was found to have acute hypoxic respiratory failure along with probable CHF exacerbation and AKI.  She was admitted for further evaluation.  On evening of 6/22, she became more somnolent.  ABG revealed respiratory acidosis / hypercarbic and hypoxemic respiratory failure (7.10 / CO2 too high to detect / 75).  She was placed on NRBM prior to transfer and she was able to answer questions but would go back to sleep.  She was subsequently transferred to the ICU and and started on BiPAP trial.  She is at risk for intubation.  Pt was placed on NIV/Bipap. PEEP 7, PS 25, 40% FiO2, rate of 16.  TV aroudn 500 ml. Good seal.    PAST MEDICAL HISTORY :  She  has a past medical history of Sciatica; Asthma; Diastolic dysfunction with heart failure (HCC); Morbid obesity (HCC); Thrombocytopenia (HCC); Malignant hypertension; and Tobacco abuse.  PAST SURGICAL HISTORY: She  has past surgical history that includes Cholecystectomy; Appendectomy; Cesarean section; and Small intestine surgery.  No Known Allergies  No current facility-administered  medications on file prior to encounter.   Current Outpatient Prescriptions on File Prior to Encounter  Medication Sig  . albuterol (PROVENTIL HFA;VENTOLIN HFA) 108 (90 Base) MCG/ACT inhaler Inhale 2 puffs into the lungs every 6 (six) hours as needed for wheezing or shortness of breath.  . furosemide (LASIX) 20 MG tablet Take 1 tablet (20 mg total) by mouth daily as needed. Take one tablet if you gain more than 2 pounds in 24 hours.  . hydrALAZINE (APRESOLINE) 25 MG tablet Take 1 tablet (25 mg total) by mouth every 8 (eight) hours.  . metoprolol tartrate (LOPRESSOR) 25 MG tablet Take 1 tablet (25 mg total) by mouth 2 (two) times daily.    FAMILY HISTORY:  Her indicated that her mother is deceased. She indicated that her father is alive.   SOCIAL HISTORY: She  reports that she has been smoking Cigarettes.  She has a 2.5 pack-year smoking history. She has never used smokeless tobacco. She reports that she drinks about 0.6 oz of alcohol per week. She reports that she does not use illicit drugs.  Has a GF, has 2 children, works as a Lawyer. 40 PY smoking history. (-) ETOH. Independent in her ADLs. Lives with sister.   REVIEW OF SYSTEMS:   SOB, cough, wheeze x 4 days. (-) fevers, chills. Drowsy the last 24 hrs, worse the last 6 hrs. 14 point ROS done and everything else other than the ones mentioned are (-)  SUBJECTIVE:  More "awake on Bipap/NIV. (-) subjective complaints.   VITAL SIGNS: BP 123/77 mmHg  Pulse 81  Temp(Src) 97.9 F (36.6 C) (Oral)  Resp 30  Ht  (1.6 m)  Wt 327 lb 9.6 oz (148.598 kg)  BMI 58.05 kg/m2  SpO2 94%  LMP 05/21/2015 (Approximate)  HEMODYNAMICS:    VENTILATOR SETTINGS: Vent Mode:  [-]  FiO2 (%):  [55 %-80 %] 80 %  INTAKE / OUTPUT: I/O last 3 completed shifts: In: -  Out: 200 [Urine:200]   PHYSICAL EXAMINATION: General: Morbidly obese female, NAD, wearing Bipap.  Neuro: CN grossly intact. (-) lateralizing signs.  HEENT: PERLA, (-) NVD. Airway  class 4. (-) oral thrush Cardiovascular: good s1/s2. (-) s3/m/r/g Lungs: Dec BS on BLF. Crackles at bases. Some upper wheezing Abdomen: (+) BS, soft, obese, distended. (-) masses Musculoskeletal: Gr 1 edema. (-) clubbing/cyanosis Skin: warm and dry.   LABS:  BMET  Recent Labs Lab 08/17/15 1600 08/18/15 0347  NA 140 139  K 4.3 4.4  CL 102 99*  CO2 34* 35*  BUN 15 17  CREATININE 1.12* 1.42*  GLUCOSE 83 106*    Electrolytes  Recent Labs Lab 08/17/15 1600 08/18/15 0347  CALCIUM 8.6* 8.4*    CBC  Recent Labs Lab 08/17/15 1600  WBC 7.2  HGB 13.6  HCT 43.0  PLT 107*    Coag's No results for input(s): APTT, INR in the last 168 hours.  Sepsis Markers No results for input(s): LATICACIDVEN, PROCALCITON, O2SATVEN in the last 168 hours.  ABG  Recent Labs Lab 08/18/15 1702  PHART 7.103*  PCO2ART ABOVE REPORTABLE RANGE  PO2ART 75.8*    Liver Enzymes No results for input(s): AST, ALT, ALKPHOS, BILITOT, ALBUMIN in the last 168 hours.  Cardiac Enzymes No results for input(s): TROPONINI, PROBNP in the last 168 hours.  Glucose No results for input(s): GLUCAP in the last 168 hours.  Imaging No results found.   STUDIES:  CXR 6/21 > vascular congestion.  CULTURES: MRSA 6/22 > Blood culture 6/22 >   ANTIBIOTICS: None.  SIGNIFICANT EVENTS: 6/21 > admit. 6/22 > transferred to ICU and started on BiPAP/NIV for respiratory acidosis / hypoxemic and hypercarbic respiratory failure.  LINES/TUBES: None.  DISCUSSION: 45 y.o. F admitted 6/21 with acute hypoxic respiratory failure, CHF exacerbation, and AKI.  Developed hypersomnolence 06/22 and found to have significant hypercarbic respiratory failure so transferred to ICU and started on BiPAP.  ASSESSMENT / PLAN:  PULMONARY A: Acute on chronic hypoxemic and hypercarbic respiratory failure 2/2 OHS, Likely OSA, Diastolic Dysfxn H/O asthma in mild exacerbation Tobacco use disorder. P:   Continue  BiPAP/NIV trial. On PEEP 7, PS 25, rate 16, 40% Fio2. Pulling 500 TV. Rpt abg in 1 hr > if worse or if clinically deteriorates, will need to be intubated.  Maintain SpO2 > 90%. Pulmonary hygiene. Will need outpatient sleep study. CXR now. Pulmicort neb bid and duoneb q4.   CARDIOVASCULAR A:  dCHF with exacerbation - on last discharge May 2017 weight was 321lbs, now up to 327lbs. Hx HTN. P:  Aggressive diuresis as BP and SCr permit. Pt now with AKI which is probably worse as she did NOT eat all day. Will hold diuresis. IVF 30 mls/hr. May d/c IVF if CXR is worse (pulm edema).    RENAL A:   AKI. P:   Correct electrolytes as indicated. BMP in AM. Hold lasix. IVF 30 ml/hr as pt is not eating and did not have any PO today.   GASTROINTESTINAL  A:   Morbid obesity. Nutrition. P:   NPO for now  HEMATOLOGIC A:   Thrombocytopenia - chronic. VTE Prophylaxis. P:  Monitor platelet counts. SCD's / heparin. CBC in AM.  INFECTIOUS A:   No indication of infection. P:   Monitor clinically. Check PCT, lactate, MRSA, blood culture. Hold off on abx.   ENDOCRINE A:   No acute issues.   P:   Monitor glucose on BMP. SSI q 4 while NPO  NEUROLOGIC A:   Acute hypercarbic encephalopathy. P:   Continue BiPAP/NIV  trial, at risk for intubation however. Monitor closely. Avoid sedating meds.  Family updated: Pt has a girlfriend [name] and pt makes her the Management consultant. Shanda Bumps was updated at bedisde.   Interdisciplinary Family Meeting v Palliative Care Meeting:  Due by: 06/28.  CC time: 35 minutes.    Pollie Meyer, MD 08/18/2015, 7:31 PM [facility] Pager [phone] After 3 pm or if no answer, call [phone]

## 2015-08-18 NOTE — Progress Notes (Signed)
Consult re: Intubation  Upon entering room. Pt is awake, alert and conversing with Carollee Herter, CRNA. At this time, she declines intubation and would like BiPAP trial. Based on physical exam and current mentation, I agree and recommend BiPAP trial. I informed the patient if she is not compliant with BiPAP or her mentation declines, we will likely proceed with intubation and ventilator support. She is agreeable to plan.  Please call anesthesia with questions or if intubation is deemed necessary after failed BiPAP trial.   Kaylyn Layer. Hart Rochester, MD, [facility]

## 2015-08-18 NOTE — Progress Notes (Signed)
Received ABG results; Dr. Catha Gosselin notified.  Patient to transfer to ICU.

## 2015-08-19 ENCOUNTER — Observation Stay (HOSPITAL_COMMUNITY): Payer: Self-pay

## 2015-08-19 ENCOUNTER — Observation Stay (HOSPITAL_COMMUNITY): Payer: MEDICAID

## 2015-08-19 LAB — BLOOD GAS, ARTERIAL
ACID-BASE EXCESS: 1.8 mmol/L (ref 0.0–2.0)
Bicarbonate: 28 mEq/L — ABNORMAL HIGH (ref 20.0–24.0)
DELIVERY SYSTEMS: POSITIVE
DRAWN BY: 398981
DRAWN BY: 398981
Delivery systems: POSITIVE
Drawn by: 295031
Expiratory PAP: 7
Expiratory PAP: 7
FIO2: 0.6
FIO2: 0.5
FIO2: 1
Inspiratory PAP: 21
Inspiratory PAP: 32
LHR: 16 {breaths}/min
LHR: 24 {breaths}/min
LHR: 24 {breaths}/min
O2 SAT: 92.2 %
O2 Saturation: 93 %
O2 Saturation: 99.1 %
PEEP/CPAP: 8 cmH2O
PH ART: 7.095 — AB (ref 7.350–7.450)
PO2 ART: 79.1 mmHg — AB (ref 80.0–100.0)
Patient temperature: 97.9
Patient temperature: 98.6
Patient temperature: 98.6
TCO2: 25.5 mmol/L (ref 0–100)
VT: 460 mL
pCO2 arterial: 53.8 mmHg — ABNORMAL HIGH (ref 35.0–45.0)
pH, Arterial: 7.099 — CL (ref 7.350–7.450)
pH, Arterial: 7.337 — ABNORMAL LOW (ref 7.350–7.450)
pO2, Arterial: 80.4 mmHg (ref 80.0–100.0)
pO2, Arterial: 138 mmHg — ABNORMAL HIGH (ref 80.0–100.0)

## 2015-08-19 LAB — BASIC METABOLIC PANEL
ANION GAP: 6 (ref 5–15)
Anion gap: 4 — ABNORMAL LOW (ref 5–15)
Anion gap: 4 — ABNORMAL LOW (ref 5–15)
BUN: 21 mg/dL — AB (ref 6–20)
BUN: 24 mg/dL — AB (ref 6–20)
BUN: 29 mg/dL — ABNORMAL HIGH (ref 6–20)
CALCIUM: 7.9 mg/dL — AB (ref 8.9–10.3)
CHLORIDE: 100 mmol/L — AB (ref 101–111)
CO2: 36 mmol/L — ABNORMAL HIGH (ref 22–32)
CO2: 33 mmol/L — ABNORMAL HIGH (ref 22–32)
CO2: 29 mmol/L (ref 22–32)
CREATININE: 1.6 mg/dL — AB (ref 0.44–1.00)
CREATININE: 2.13 mg/dL — AB (ref 0.44–1.00)
Calcium: 8.2 mg/dL — ABNORMAL LOW (ref 8.9–10.3)
Calcium: 8.3 mg/dL — ABNORMAL LOW (ref 8.9–10.3)
Chloride: 97 mmol/L — ABNORMAL LOW (ref 101–111)
Chloride: 102 mmol/L (ref 101–111)
Creatinine, Ser: 2.06 mg/dL — ABNORMAL HIGH (ref 0.44–1.00)
GFR calc Af Amer: 32 mL/min — ABNORMAL LOW (ref >60)
GFR, EST AFRICAN AMERICAN: 44 mL/min — AB (ref >60)
GFR, EST AFRICAN AMERICAN: 31 mL/min — AB (ref >60)
GFR, EST NON AFRICAN AMERICAN: 38 mL/min — AB (ref >60)
GFR, EST NON AFRICAN AMERICAN: 27 mL/min — AB (ref >60)
GFR, EST NON AFRICAN AMERICAN: 28 mL/min — AB (ref >60)
GLUCOSE: 98 mg/dL (ref 65–99)
Glucose, Bld: 151 mg/dL — ABNORMAL HIGH (ref 65–99)
Glucose, Bld: 123 mg/dL — ABNORMAL HIGH (ref 65–99)
POTASSIUM: 6.2 mmol/L — AB (ref 3.5–5.1)
POTASSIUM: 3.7 mmol/L (ref 3.5–5.1)
Potassium: 5.5 mmol/L — ABNORMAL HIGH (ref 3.5–5.1)
SODIUM: 137 mmol/L (ref 135–145)
SODIUM: 137 mmol/L (ref 135–145)
SODIUM: 137 mmol/L (ref 135–145)

## 2015-08-19 LAB — GLUCOSE, CAPILLARY
GLUCOSE-CAPILLARY: 101 mg/dL — AB (ref 65–99)
GLUCOSE-CAPILLARY: 104 mg/dL — AB (ref 65–99)
GLUCOSE-CAPILLARY: 116 mg/dL — AB (ref 65–99)
GLUCOSE-CAPILLARY: 56 mg/dL — AB (ref 65–99)
GLUCOSE-CAPILLARY: 80 mg/dL (ref 65–99)
Glucose-Capillary: 95 mg/dL (ref 65–99)
Glucose-Capillary: 75 mg/dL (ref 65–99)
Glucose-Capillary: 53 mg/dL — ABNORMAL LOW (ref 65–99)

## 2015-08-19 LAB — CBC
HCT: 45.2 % (ref 36.0–46.0)
Hemoglobin: 13.2 g/dL (ref 12.0–15.0)
MCH: 30.9 pg (ref 26.0–34.0)
MCHC: 29.2 g/dL — ABNORMAL LOW (ref 30.0–36.0)
MCV: 105.9 fL — ABNORMAL HIGH (ref 78.0–100.0)
PLATELETS: 126 10*3/uL — AB (ref 150–400)
RBC: 4.27 MIL/uL (ref 3.87–5.11)
RDW: 14 % (ref 11.5–15.5)
WBC: 10.9 10*3/uL — AB (ref 4.0–10.5)

## 2015-08-19 LAB — PHOSPHORUS
PHOSPHORUS: 7 mg/dL — AB (ref 2.5–4.6)
PHOSPHORUS: 6.3 mg/dL — AB (ref 2.5–4.6)
Phosphorus: 3.1 mg/dL (ref 2.5–4.6)

## 2015-08-19 LAB — MAGNESIUM
MAGNESIUM: 1.9 mg/dL (ref 1.7–2.4)
Magnesium: 1.9 mg/dL (ref 1.7–2.4)
Magnesium: 1.6 mg/dL — ABNORMAL LOW (ref 1.7–2.4)

## 2015-08-19 LAB — TROPONIN I
Troponin I: 0.03 ng/mL (ref <0.031)
Troponin I: 0.03 ng/mL (ref <0.031)
Troponin I: 0.12 ng/mL — ABNORMAL HIGH (ref <0.031)

## 2015-08-19 LAB — PROCALCITONIN: Procalcitonin: 0.66 ng/mL

## 2015-08-19 LAB — STREP PNEUMONIAE URINARY ANTIGEN: STREP PNEUMO URINARY ANTIGEN: NEGATIVE

## 2015-08-19 MED ORDER — MIDAZOLAM HCL 2 MG/2ML IJ SOLN: 2.0000 mg | INTRAMUSCULAR | Status: DC | PRN

## 2015-08-19 MED ORDER — DEXTROSE 50 % IV SOLN
INTRAVENOUS | Status: AC
  Administered 2015-08-19: 25 mL
  Filled 2015-08-19: qty 50

## 2015-08-19 MED ORDER — MIDAZOLAM HCL 2 MG/2ML IJ SOLN
INTRAMUSCULAR | Status: AC
  Administered 2015-08-19: 2 mg
  Filled 2015-08-19: qty 4

## 2015-08-19 MED ORDER — FREE WATER
30.0000 mL | Status: DC
  Administered 2015-08-19 – 2015-08-20 (×4): 30 mL

## 2015-08-19 MED ORDER — IPRATROPIUM-ALBUTEROL 0.5-2.5 (3) MG/3ML IN SOLN
3.0000 mL | Freq: Four times a day (QID) | RESPIRATORY_TRACT | Status: DC
Start: 2015-08-19 — End: 2015-08-28
  Administered 2015-08-19 – 2015-08-28 (×35): 3 mL via RESPIRATORY_TRACT
  Filled 2015-08-19 (×35): qty 3

## 2015-08-19 MED ORDER — CHLORHEXIDINE GLUCONATE 0.12% ORAL RINSE (MEDLINE KIT)
15.0000 mL | Freq: Two times a day (BID) | OROMUCOSAL | Status: DC
  Administered 2015-08-19 – 2015-08-26 (×15): 15 mL via OROMUCOSAL

## 2015-08-19 MED ORDER — ANTISEPTIC ORAL RINSE SOLUTION (CORINZ)
7.0000 mL | Freq: Four times a day (QID) | OROMUCOSAL | Status: DC
  Administered 2015-08-19 – 2015-08-26 (×28): 7 mL via OROMUCOSAL

## 2015-08-19 MED ORDER — DEXTROSE 50 % IV SOLN
INTRAVENOUS | Status: AC
  Administered 2015-08-19: 50 mL
  Filled 2015-08-19: qty 50

## 2015-08-19 MED ORDER — SODIUM CHLORIDE 0.9 % IV BOLUS (SEPSIS)
1000.0000 mL | Freq: Once | INTRAVENOUS | Status: AC
  Administered 2015-08-19: 1000 mL via INTRAVENOUS

## 2015-08-19 MED ORDER — SODIUM CHLORIDE 0.9 % IV BOLUS (SEPSIS)
500.0000 mL | Freq: Once | INTRAVENOUS | Status: AC
  Administered 2015-08-19: 500 mL via INTRAVENOUS

## 2015-08-19 MED ORDER — FENTANYL CITRATE (PF) 100 MCG/2ML IJ SOLN
100.0000 ug | INTRAMUSCULAR | Status: DC | PRN
  Filled 2015-08-19 (×2): qty 2

## 2015-08-19 MED ORDER — SODIUM CHLORIDE 0.9 % IV SOLN
25.0000 ug/h | INTRAVENOUS | Status: DC
  Administered 2015-08-19: 25 ug/h via INTRAVENOUS
  Administered 2015-08-20: 75 ug/h via INTRAVENOUS
  Administered 2015-08-21: 90 ug/h via INTRAVENOUS
  Administered 2015-08-22 – 2015-08-24 (×3): 100 ug/h via INTRAVENOUS
  Filled 2015-08-19 (×6): qty 50

## 2015-08-19 MED ORDER — VITAL HIGH PROTEIN PO LIQD
1000.0000 mL | ORAL | Status: DC
  Administered 2015-08-19 – 2015-08-20 (×3): 1000 mL
  Administered 2015-08-20: 11:00:00
  Administered 2015-08-21: 1000 mL
  Administered 2015-08-21 – 2015-08-22 (×3)
  Administered 2015-08-22: 1000 mL
  Administered 2015-08-22 (×2)
  Filled 2015-08-19 (×5): qty 1000

## 2015-08-19 MED ORDER — PRO-STAT SUGAR FREE PO LIQD
60.0000 mL | Freq: Two times a day (BID) | ORAL | Status: DC
  Administered 2015-08-19 – 2015-08-22 (×6): 60 mL
  Filled 2015-08-19 (×6): qty 60

## 2015-08-19 MED ORDER — FENTANYL CITRATE (PF) 100 MCG/2ML IJ SOLN: 100.0000 ug | INTRAMUSCULAR | Status: DC | PRN

## 2015-08-19 MED ORDER — PRO-STAT SUGAR FREE PO LIQD: 30.0000 mL | Freq: Two times a day (BID) | ORAL | Status: DC

## 2015-08-19 MED ORDER — FENTANYL BOLUS VIA INFUSION
50.0000 ug | INTRAVENOUS | Status: DC | PRN
  Administered 2015-08-19 – 2015-08-22 (×2): 50 ug via INTRAVENOUS
  Filled 2015-08-19: qty 50

## 2015-08-19 MED ORDER — MAGNESIUM SULFATE 2 GM/50ML IV SOLN
2.0000 g | Freq: Once | INTRAVENOUS | Status: AC
  Administered 2015-08-19: 2 g via INTRAVENOUS
  Filled 2015-08-19: qty 50

## 2015-08-19 MED ORDER — PHENYLEPHRINE HCL 10 MG/ML IJ SOLN
0.0000 ug/min | INTRAVENOUS | Status: DC
  Administered 2015-08-19: 60 ug/min via INTRAVENOUS
  Administered 2015-08-19: 20 ug/min via INTRAVENOUS
  Filled 2015-08-19 (×3): qty 1

## 2015-08-19 MED ORDER — SODIUM POLYSTYRENE SULFONATE 15 GM/60ML PO SUSP
30.0000 g | Freq: Once | ORAL | Status: AC
  Administered 2015-08-19: 30 g via RECTAL
  Filled 2015-08-19: qty 120

## 2015-08-19 MED ORDER — FENTANYL CITRATE (PF) 100 MCG/2ML IJ SOLN: 50.0000 ug | Freq: Once | INTRAMUSCULAR | Status: DC

## 2015-08-19 MED ORDER — SODIUM CHLORIDE 0.9 % IV SOLN
2.0000 mg/h | INTRAVENOUS | Status: DC
  Administered 2015-08-19: 3.5 mg/h via INTRAVENOUS
  Administered 2015-08-19: 1 mg/h via INTRAVENOUS
  Administered 2015-08-20: 4 mg/h via INTRAVENOUS
  Filled 2015-08-19 (×3): qty 10

## 2015-08-19 MED ORDER — SODIUM CHLORIDE 0.9 % IV SOLN: 25.0000 ug/h | INTRAVENOUS | Status: DC

## 2015-08-19 MED ORDER — ACETAMINOPHEN 160 MG/5ML PO SOLN
650.0000 mg | Freq: Four times a day (QID) | ORAL | Status: DC | PRN
  Administered 2015-08-21: 650 mg
  Filled 2015-08-19 (×2): qty 20.3

## 2015-08-19 MED ORDER — FENTANYL CITRATE (PF) 100 MCG/2ML IJ SOLN
INTRAMUSCULAR | Status: AC
  Administered 2015-08-19: 100 ug
  Filled 2015-08-19: qty 4

## 2015-08-19 MED ORDER — SODIUM CHLORIDE 0.9 % IV SOLN
1.0000 g | Freq: Once | INTRAVENOUS | Status: AC
  Administered 2015-08-19: 1 g via INTRAVENOUS
  Filled 2015-08-19 (×2): qty 10

## 2015-08-19 MED ORDER — PANTOPRAZOLE SODIUM 40 MG PO PACK
40.0000 mg | PACK | Freq: Every day | ORAL | Status: DC
Start: 2015-08-19 — End: 2015-08-28
  Administered 2015-08-20 – 2015-08-28 (×8): 40 mg
  Filled 2015-08-19 (×11): qty 20

## 2015-08-19 MED ORDER — MIDAZOLAM HCL 2 MG/2ML IJ SOLN
2.0000 mg | INTRAMUSCULAR | Status: DC | PRN
  Administered 2015-08-19 – 2015-08-21 (×4): 2 mg via INTRAVENOUS
  Filled 2015-08-19 (×5): qty 2

## 2015-08-19 NOTE — Progress Notes (Signed)
eLink Physician-Brief Progress Note Patient Name: Jennifer Owens DOB: November 11, 1970 MRN: [id number]   Date of Service  08/19/2015  HPI/Events of Note  Patient was continued and slightly worsened hyperkalemia. Persistent hypotension despite IV fluid bolus. Renal function worsening as well.   eICU Interventions  1. Calcium gluconate 1 g IV 2. EKG now 3. Kayexalate enema 30 g 1 now 4. Continue telemetry monitoring 5. ABG now 6. Normal saline 1 L IV now 7. Neo-Synephrine low-dose to maintain  MAP>65 & SBP >90     Intervention Category Major Interventions: Acid-Base disturbance - evaluation and management;Electrolyte abnormality - evaluation and management  Lawanda Cousins 08/19/2015, 4:02 AM

## 2015-08-19 NOTE — Procedures (Signed)
Central Venous Catheter Insertion Procedure Note Jennifer Owens [id number] 1970/08/09  Procedure: Insertion of Central Venous Catheter Indications: Assessment of intravascular volume and Drug and/or fluid administration  Procedure Details Consent: Risks of procedure as well as the alternatives and risks of each were explained to the (patient/caregiver).  Consent for procedure obtained. Time Out: Verified patient identification, verified procedure, site/side was marked, verified correct patient position, special equipment/implants available, medications/allergies/relevent history reviewed, required imaging and test results available.  Performed Real time [place] was used to ID and cannulate the vessel  Maximum sterile technique was used including antiseptics, cap, gloves, gown, hand hygiene, mask and sheet. Skin prep: Chlorhexidine; local anesthetic administered A antimicrobial bonded/coated triple lumen catheter was placed in the left internal jugular vein using the Seldinger technique.  Evaluation Blood flow good Complications: No apparent complications Patient did tolerate procedure well. Chest X-ray ordered to verify placement.  CXR: pending.  [name] 08/19/2015, 10:04 AM  Simonne Martinet ACNP-BC [facility] Pulmonary/Critical Care Pager # [phone] OR # [phone] if no answer

## 2015-08-19 NOTE — Progress Notes (Signed)
Patient seen by PCCM.  Overnight, patient became hypotensive and was placed on pressors. ABG continued to be worse despite BiPAP.  Patient intubated and PCCM graciously assumed care of the patient.    Time spent: 10 minutes  Ruther Ephraim D.O. [facility] Pager [phone]  If 7PM-7AM, please contact night-coverage [email or web address] Password [email or web address] 08/19/2015, 1:25 PM

## 2015-08-19 NOTE — Progress Notes (Signed)
eLink Physician-Brief Progress Note Patient Name: Jennifer Owens DOB: Oct 12, 1970 MRN: [id number]   Date of Service  08/19/2015  HPI/Events of Note  Camara check on patient with respiratory failure. Patient continues to be arousable with stimulation. Currently on FiO2 0.6. Responding to IV fluid boluses.   eICU Interventions  1. Normal saline 1 L IV fluid bolus 2. Wean FiO2 to 0.5 formal saturation 88-94 % 3. Checking troponin I every 6 hours 3      Intervention Category Major Interventions: Respiratory failure - evaluation and management;Hypotension - evaluation and management  Lawanda Cousins 08/19/2015, 2:15 AM

## 2015-08-19 NOTE — Progress Notes (Signed)
eLink Physician-Brief Progress Note Patient Name: Jennifer Owens DOB: 1971-01-14 MRN: [id number]   Date of Service  08/19/2015  HPI/Events of Note  ABG 7.1 / ?? / 79. Sat 98% on FiO2 0.5.  eICU Interventions  1. Increase RR to 30 2. Decrease FiO2 to 0.35     Intervention Category Major Interventions: Respiratory failure - evaluation and management  Lawanda Cousins 08/19/2015, 4:26 AM

## 2015-08-19 NOTE — Procedures (Signed)
Intubation Procedure Note Jennifer Owens [id number] 1971/01/22  Procedure: Intubation Indications: Respiratory insufficiency  Procedure Details Consent: Risks of procedure as well as the alternatives and risks of each were explained to the (patient/caregiver).  Consent for procedure obtained. Time Out: Verified patient identification, verified procedure, site/side was marked, verified correct patient position, special equipment/implants available, medications/allergies/relevent history reviewed, required imaging and test results available.  Performed  Maximum sterile technique was used including antiseptics, cap, gloves, gown, hand hygiene and mask.  MAC and 4 Glide scope  7.5 tube   Evaluation Hemodynamic Status: BP stable throughout; O2 sats: stable throughout Patient's Current Condition: stable Complications: No apparent complications Patient did tolerate procedure well. Chest X-ray ordered to verify placement.  CXR: pending.   Jennifer Owens 08/19/2015  Simonne Martinet ACNP-BC [facility] Pulmonary/Critical Care Pager # [phone] OR # [phone] if no answer

## 2015-08-19 NOTE — Progress Notes (Signed)
eLink Physician-Brief Progress Note Patient Name: ANAE STEENSON DOB: Jul 24, 1970 MRN: [id number]   Date of Service  08/19/2015  HPI/Events of Note  Camera check on patient. Bedside nurse contacted regarding hypotension. Patient responded to IV fluid bolus earlier in the evening. Awakes with stimulation. Current FiO2 1.0 saturation 98%.   eICU Interventions  1. Normal saline 500 mL bolus 2. Weaning FiO2 for saturation 88-94 % 3. Backup rate increased  4. Close ICU monitoring      Intervention Category Major Interventions: Hypotension - evaluation and management  Lawanda Cousins 08/19/2015, 12:44 AM

## 2015-08-19 NOTE — Progress Notes (Signed)
Patient unable to void after several attempts at using the bedpan. Attempt was made to complete a bladder scan, but  due to her body habitus it was not feasible. Patient is oriented but very drowsy, foley cath was placed per previous order, 100 ml of clear yellow urine was returned. Patient tolerated the procedure ok.

## 2015-08-19 NOTE — Progress Notes (Signed)
eLink Physician-Brief Progress Note Patient Name: Jennifer Owens DOB: 04/18/1970 MRN: [id number]   Date of Service  08/19/2015  HPI/Events of Note  Electrolytes reviewed. Potassium now 3.7. Creatinine 2.06. Magnesium 1.6.   eICU Interventions  1. Magnesium sulfate 2 g IV now  2. Repeat electrolytes already ordered for the morning      Intervention Category Intermediate Interventions: Electrolyte abnormality - evaluation and management  Lawanda Cousins 08/19/2015, 10:30 PM

## 2015-08-19 NOTE — Progress Notes (Signed)
Initial Nutrition Assessment  DOCUMENTATION CODES:   Morbid obesity  INTERVENTION:  - Will order Vital High Protein @ 40 mL/hr with 60 mL Prostat BID. This regimen will provide 1360 kcal, 144 grams of protein, and 803 mL free water. Will also provide 1536 mg K and 768 mg Phos.  - Will order 30 mL free water every 4 hours; order to be adjusted by MD/NP.  - PEPup Protocol. - RD will follow-up 6/26.  NUTRITION DIAGNOSIS:   Inadequate oral intake related to inability to eat as evidenced by NPO status.  GOAL:   Provide needs based on ASPEN/SCCM guidelines  MONITOR:   Vent status, TF tolerance, Weight trends, Labs, Skin, I & O's  REASON FOR ASSESSMENT:   Ventilator, Consult Enteral/tube feeding initiation and management  ASSESSMENT:   45 y.o. female with medical history significant of recent diagnosis gradfe 1 diastolic heart failure on recent hospitalization. Discharged from [facility] on May 29 and generally feeling well until about 4 days ago. She began feeling sluggish like the previous presentation. Symptoms worse when outside and in the humidity - significant difficulty catching her breath. Would feel nauseated. Symptoms ongoing the last 4 days. Today, she woke up and turned off the [facility] in the house (had also turned off her fan in her room last night). Went from bed to bathroom and was completely exhausted. With these symptoms, she decided to come back in. No chest pain/pressure other than following late-night eating a few days ago which led to indigestion. Does experience cough when she gets very hot or feels congested. +LE edema. Has not obtained a scale for daily weights - she was told to take Lasix if her weight increased more than 2 pounds/day. She did take the Lasix a couple of times and she did notice improvement, but she didn't take more (last took last night) because she was noticing some cramping of legs/fingers/abdomen. Also has been trying to fluid restrict and so  concerned that she wasn't taking in enough fluid. Does note ongoing nausea following meals, and this is new for her.  Pt seen for new vent and TF consult. BMI indicates morbid obesity. Per Dr. Cristopher Estimable note 6/22 at 1255: Upon discharge, patient should discuss weight loss and lifestyle modifications with her primary care physician. Pt NPO since admission and unable to meet needs. She was initially placed on BiPAP for trial due ABG last night and was subsequently intubated this AM. Per Dr. Evlyn Courier note today at 0805: May need trach, and wonder about nocturnal ventilation long term.  Family in and out of room at time of RD visit. Unable to discuss pt from PTA with them due to their conversation with RN and needing to leave.  Patient is currently intubated on ventilator support MV: 9.8-11.6 L/min during time of visit Temp (24hrs), Avg:98.1 F (36.7 C), Min:97.7 F (36.5 C), Max:98.5 F (36.9 C) Propofol: none  Physical assessment shows no muscle or fat wasting to upper or lower body. Mild edema noted and notes indicate +1 edema on admission. Per chart review, weight up 16 lbs since admission which is likely fluid related given information outlined in H&P.   Will order TF as outlined above. Medications reviewed; 1 g IV Ca gluconate x1 dose today, sliding scale Novolog, 40 mg Protonix/day, 30 g rectal Kayexalate x1 dose today. Labs reviewed; CBGs: 95 and 104 mg/dL this AM, Cl: 614 mmol/L, BUN/creatinine elevated and trending up, Ca: 7.9 mg/dL, GFR: 31 mL/min, K: 6.2 mmol/L, Phos: 6.3 mg/dL.  Drips: Fentanyl @ 25 mcg/hr, Versed @ 2.5 mg/hr, Neo @ 60 mcg/min (at time of visit at 1040).    Diet Order:  Diet NPO time specified  Skin:  Reviewed, no issues  Last BM:  6/21  Height:   Ht Readings from Last 1 Encounters:  08/19/15  (1.651 m)    Weight:   Wt Readings from Last 1 Encounters:  08/19/15 339 lb 11.7 oz (154.1 kg)    Ideal Body Weight:  56.82 kg (kg)  BMI:  Body mass index  is 56.53 kg/(m^2).  Estimated Nutritional Needs:   Kcal:  1250-1420 (22-25 kcal/kg IBW)  Protein:  142 grams (2.5 grams/kg IBW)  Fluid:  >/= 1.5 L/day  EDUCATION NEEDS:   No education needs identified at this time     Trenton Gammon, MS, RD, LDN Inpatient Clinical Dietitian Pager # [phone] After hours/weekend pager # [phone]

## 2015-08-19 NOTE — Progress Notes (Signed)
PULMONARY / CRITICAL CARE MEDICINE   Name: Jennifer Owens MRN: [id number] DOB: 01-May-1970    ADMISSION DATE:  08/17/2015 CONSULTATION DATE:  08/18/15  REFERRING MD:  TRH  CHIEF COMPLAINT:  SOB  HISTORY OF PRESENT ILLNESS:  Jennifer Owens is a 45 y.o. female admitted on 6/21 w/ working dx of acute on Chronic hypercarbic respiratory failure in the setting of decompensated diastolic heart failure, pulmonary edema, and Cor Pulmonale. Placed on NIPPV and treated w/ IV diuresis/ no sig improvmenet. PCCM assuming care as of 6/23.    SUBJECTIVE:  Awakens to voice, but confused and speech is slurred.   VITAL SIGNS: BP 92/64 mmHg  Pulse 62  Temp(Src) 98.5 F (36.9 C) (Axillary)  Resp 30  Ht  (1.6 m)  Wt 339 lb 11.7 oz (154.1 kg)  BMI 60.20 kg/m2  SpO2 96%  LMP 05/21/2015 (Approximate)  HEMODYNAMICS:    VENTILATOR SETTINGS: Vent Mode:  [-] BIPAP FiO2 (%):  [40 %-100 %] 50 % Set Rate:  [16 bmp-24 bmp] 24 bmp PEEP:  [7 cmH20] 7 cmH20  INTAKE / OUTPUT: I/O last 3 completed shifts: In: 4004.5 [I.V.:404.5; IV Piggyback:3600] Out: 1100 [Urine:1100]   PHYSICAL EXAMINATION: General: Morbidly obese female, wearing BIPAP but dozing off to sleep mid-sentence. Myoclonic twitching.  Neuro: she is non-focal but obtunded, wakes for only seconds then back off to sleep  HEENT: PERLA, (-) NVD. Airway class 4. (-) oral thrush Cardiovascular: s1/s2. (-) s3/m/r/g Lungs: Dec BS on BLF.  Abdomen: (+) BS, soft, obese, distended. (-) masses Musculoskeletal: Gr 1 edema. (-) clubbing/cyanosis Skin: warm and dry.   LABS:  BMET  Recent Labs Lab 08/18/15 0347 08/18/15 1942 08/19/15 0233  NA 139 137 137  K 4.4 5.5* 6.2*  CL 99* 97* 100*  CO2 35* 36* 33*  BUN 17 21* 24*  CREATININE 1.42* 1.60* 2.13*  GLUCOSE 106* 151* 123*    Electrolytes  Recent Labs Lab 08/18/15 0347 08/18/15 1942 08/19/15 0233  CALCIUM 8.4* 8.2* 7.9*  MG  --  2.0 1.9  PHOS  --  6.9* 7.0*     CBC  Recent Labs Lab 08/17/15 1600 08/18/15 1942 08/19/15 0233  WBC 7.2 11.4* 10.9*  HGB 13.6 13.0 13.2  HCT 43.0 44.9 45.2  PLT 107* 118* 126*    Coag's No results for input(s): APTT, INR in the last 168 hours.  Sepsis Markers  Recent Labs Lab 08/18/15 1942 08/18/15 1943 08/18/15 2207 08/19/15 0233  LATICACIDVEN  --  1.1 0.7  --   PROCALCITON <0.10  --   --  0.66    ABG  Recent Labs Lab 08/18/15 1702 08/18/15 2047 08/19/15 0410  PHART 7.103* 7.076* 7.099*  PCO2ART ABOVE REPORTABLE RANGE PENDING PENDING  PO2ART 75.8* 81.9 79.1*    Liver Enzymes No results for input(s): AST, ALT, ALKPHOS, BILITOT, ALBUMIN in the last 168 hours.  Cardiac Enzymes  Recent Labs Lab 08/19/15 0233  TROPONINI 0.03    Glucose  Recent Labs Lab 08/18/15 1956 08/18/15 2331 08/19/15 0403  GLUCAP 97 116* 104*    Imaging Dg Chest 1 View  08/18/2015  CLINICAL DATA:  Acute respiratory distress EXAM: CHEST 1 VIEW COMPARISON:  08/17/2015 FINDINGS: Stable cardiomegaly. Bilateral interstitial thickening. No pleural effusion or pneumothorax. No acute osseous abnormality. IMPRESSION: Cardiomegaly with pulmonary vascular congestion. No significant interval change compared with 08/17/2015. Electronically Signed   By: Elige Ko   On: 08/18/2015 19:43     STUDIES:  CXR 6/21 > vascular  congestion.  CULTURES: MRSA 6/22 > Blood culture 6/22 >   ANTIBIOTICS: None.  SIGNIFICANT EVENTS: 6/21 > admit. 6/22 > transferred to ICU and started on BiPAP/NIV for respiratory acidosis / hypoxemic and hypercarbic respiratory failure. 6/23 worse   LINES/TUBES: None.  DISCUSSION: 45 y.o. F admitted 6/21 with acute hypoxic respiratory failure, CHF exacerbation, and AKI.  Developed hypersomnolence 06/22 and found to have significant hypercarbic respiratory failure so transferred to ICU and started on BiPAP.  ASSESSMENT / PLAN:  PULMONARY A: Acute on chronic hypoxemic and  hypercarbic respiratory failure 2/2 OHS, Likely OSA, Diastolic Dysfxn H/O asthma in mild exacerbation Tobacco use disorder. ->failing NIPPV as of 6/23 P:   Will need mech vent to unload right heart.  Resume diuresis when BP will allow  Pulmicort neb bid and duoneb q4.  May need trach, and wonder about nocturnal ventilation long term   CARDIOVASCULAR A:  dCHF with exacerbation - on last discharge May 2017 weight was 321lbs, now up to 327lbs-->EF preserved by echo on 5/26 Pulmonary Edema  Hx HTN; but HYPOTENSIVE 6/23-->? R/t acidosis  P:  Hold diuretics Will consider central access. If placed will get SVO2 May need vasoactive gtt to support MAP   RENAL A:   AKI-->worse as of 6/23 Hyperkalemia-->got Kayexalate 6/23 P:   Correct acidosis  BMP in AM. Hold lasix Strict I&O  GASTROINTESTINAL A:   Morbid obesity. Nutrition. P:   NPO for now; place OFT s/p ETT start TFs  HEMATOLOGIC A:   Thrombocytopenia - chronic. VTE Prophylaxis. P:  Monitor platelet counts. SCD's / heparin. CBC in AM.  INFECTIOUS A:   No indication of infection. P:   Monitor clinically. Check PCT, lactate, MRSA, blood culture. Hold off on abx.  Get sputum s/p intubation   ENDOCRINE A:   No acute issues.   P:   Monitor glucose on BMP. SSI q 4 while NPO  NEUROLOGIC A:   Acute hypercarbic encephalopathy. P:   Continue BiPAP/NIV  trial, at risk for intubation however. Monitor closely. Avoid sedating meds.  Family updated: Pt has a girlfriend [name] and pt makes her the Management consultant. Shanda Bumps has been contacted   Interdisciplinary Family Meeting v Palliative Care Meeting:  Due by: 06/28.  CC time: 35 minutes.   Simonne Martinet ACNP-BC [facility] Pulmonary/Critical Care Pager # [phone] OR # [phone] if no answer    Progress hypercapnia and hypoxia.  Not improving with BiPAP.  Somnolent, wakes up with stimulation and follows commands.  Poor air movement.  HR regular.   Abd soft.  1+ edema.  WBC 10.9, K 6.2, Creatinine 2.13, Procalcitonin 0.66.  Assessment/plan: Acute on chronic hypoxic/hypercapnic respiratory failure 2nd to OSA/OHS. Hx of Asthma. - proceed with intubation - f/u CXR, ABG - continue BDs  Acute on chronic diastolic CHF. Hypotension. - even fluid balance - place CVL - pressors to keep MAP > 65  AKI. Hyperkalemia. - f/u BMET  Acute metabolic encephalopathy. - RASS goal -1  CC time by me independent of APP time and procedure time 31 minutes.  Coralyn Helling, MD [facility] Pulmonary/Critical Care 08/19/2015, 9:39 AM Pager:  [phone] After 3pm call: [phone]

## 2015-08-19 NOTE — Progress Notes (Signed)
Pt's HR consistently running 40-50 BPM, this is below the pt's baseline.   NP made aware.    Will continue to monitor.

## 2015-08-20 ENCOUNTER — Inpatient Hospital Stay (HOSPITAL_COMMUNITY): Payer: Self-pay

## 2015-08-20 LAB — BLOOD GAS, ARTERIAL
ACID-BASE EXCESS: 3 mmol/L — AB (ref 0.0–2.0)
Bicarbonate: 26.8 mEq/L — ABNORMAL HIGH (ref 20.0–24.0)
Drawn by: 235321
FIO2: 0.4
MECHVT: 460 mL
O2 Saturation: 90.5 %
PATIENT TEMPERATURE: 99.2
PEEP/CPAP: 8 cmH2O
PO2 ART: 61.3 mmHg — AB (ref 80.0–100.0)
RATE: 24 resp/min
TCO2: 24.2 mmol/L (ref 0–100)
pCO2 arterial: 40.5 mmHg (ref 35.0–45.0)
pH, Arterial: 7.438 (ref 7.350–7.450)

## 2015-08-20 LAB — PHOSPHORUS
Phosphorus: 2.7 mg/dL (ref 2.5–4.6)
Phosphorus: 2.3 mg/dL — ABNORMAL LOW (ref 2.5–4.6)

## 2015-08-20 LAB — GLUCOSE, CAPILLARY
GLUCOSE-CAPILLARY: 113 mg/dL — AB (ref 65–99)
GLUCOSE-CAPILLARY: 97 mg/dL (ref 65–99)
GLUCOSE-CAPILLARY: 107 mg/dL — AB (ref 65–99)
Glucose-Capillary: 115 mg/dL — ABNORMAL HIGH (ref 65–99)
Glucose-Capillary: 98 mg/dL (ref 65–99)
Glucose-Capillary: 128 mg/dL — ABNORMAL HIGH (ref 65–99)

## 2015-08-20 LAB — BASIC METABOLIC PANEL
ANION GAP: 8 (ref 5–15)
BUN: 31 mg/dL — ABNORMAL HIGH (ref 6–20)
CO2: 29 mmol/L (ref 22–32)
Calcium: 8.3 mg/dL — ABNORMAL LOW (ref 8.9–10.3)
Chloride: 101 mmol/L (ref 101–111)
Creatinine, Ser: 1.86 mg/dL — ABNORMAL HIGH (ref 0.44–1.00)
GFR calc Af Amer: 37 mL/min — ABNORMAL LOW (ref >60)
GFR calc non Af Amer: 32 mL/min — ABNORMAL LOW (ref >60)
GLUCOSE: 114 mg/dL — AB (ref 65–99)
POTASSIUM: 3.9 mmol/L (ref 3.5–5.1)
Sodium: 138 mmol/L (ref 135–145)

## 2015-08-20 LAB — CBC
HEMATOCRIT: 37.5 % (ref 36.0–46.0)
HEMOGLOBIN: 11.7 g/dL — AB (ref 12.0–15.0)
MCH: 30 pg (ref 26.0–34.0)
MCHC: 31.2 g/dL (ref 30.0–36.0)
MCV: 96.2 fL (ref 78.0–100.0)
Platelets: 94 10*3/uL — ABNORMAL LOW (ref 150–400)
RBC: 3.9 MIL/uL (ref 3.87–5.11)
RDW: 14.3 % (ref 11.5–15.5)
WBC: 10.2 10*3/uL (ref 4.0–10.5)

## 2015-08-20 LAB — MAGNESIUM
Magnesium: 2 mg/dL (ref 1.7–2.4)
Magnesium: 2 mg/dL (ref 1.7–2.4)

## 2015-08-20 LAB — PROCALCITONIN: Procalcitonin: 0.73 ng/mL

## 2015-08-20 MED ORDER — PROPOFOL 1000 MG/100ML IV EMUL
5.0000 ug/kg/min | INTRAVENOUS | Status: DC
  Administered 2015-08-20: 15 ug/kg/min via INTRAVENOUS
  Administered 2015-08-20: 5 ug/kg/min via INTRAVENOUS
  Administered 2015-08-21: 15 ug/kg/min via INTRAVENOUS
  Administered 2015-08-21: 20 ug/kg/min via INTRAVENOUS
  Administered 2015-08-21: 15 ug/kg/min via INTRAVENOUS
  Administered 2015-08-22 (×2): 20 ug/kg/min via INTRAVENOUS
  Administered 2015-08-22: 18 ug/kg/min via INTRAVENOUS
  Administered 2015-08-22 (×2): 20 ug/kg/min via INTRAVENOUS
  Administered 2015-08-23 (×3): 15 ug/kg/min via INTRAVENOUS
  Administered 2015-08-23: 18 ug/kg/min via INTRAVENOUS
  Administered 2015-08-24: 8 ug/kg/min via INTRAVENOUS
  Filled 2015-08-20 (×16): qty 100

## 2015-08-20 MED ORDER — FREE WATER
200.0000 mL | Status: DC
  Administered 2015-08-20 – 2015-08-25 (×30): 200 mL

## 2015-08-20 MED ORDER — SODIUM CHLORIDE 0.9 % IV BOLUS (SEPSIS): 1000.0000 mL | Freq: Once | INTRAVENOUS | Status: DC

## 2015-08-20 NOTE — Progress Notes (Signed)
Wasted 40 mL versed in sink with Kim RN Heloise Purpura RN

## 2015-08-20 NOTE — Progress Notes (Signed)
eLink Physician-Brief Progress Note Patient Name: Jennifer Owens DOB: 28-Feb-1970 MRN: [id number]   Date of Service  08/20/2015  HPI/Events of Note  Hypotension - BP = 74/59 related to Propofol IV infusion. BP now = 107/59 after reducing Propofol infusion rate.   eICU Interventions  Will order: 1. 0.9 NaCl 1 liter IV now. 2. Monitor CVP.     Intervention Category Intermediate Interventions: Hypotension - evaluation and management  Sommer,Steven Dennard Nip 08/20/2015, 10:27 PM

## 2015-08-20 NOTE — Progress Notes (Signed)
PULMONARY / CRITICAL CARE MEDICINE   Name: Jennifer Owens MRN: [id number] DOB: [birth year]    ADMISSION DATE:  08/17/2015 CONSULTATION DATE:  08/18/15  REFERRING MD:  TRH  CHIEF COMPLAINT:  SOB  HISTORY OF PRESENT ILLNESS:  Jennifer Owens is a 45 y.o. female admitted on 6/21 w/ working dx of acute on Chronic hypercarbic respiratory failure in the setting of decompensated diastolic heart failure, pulmonary edema, and Cor Pulmonale. Placed on NIPPV and treated w/ IV diuresis/ no sig improvement. PCCM assuming care as of 6/23.    SUBJECTIVE:  On versed gtt, Awakens to voice  Afebrile UO poor   VITAL SIGNS: BP 121/72 mmHg  Pulse 88  Temp(Src) 99.4 F (37.4 C) (Oral)  Resp 24  Ht  (1.651 m)  Wt 344 lb 2.2 oz (156.1 kg)  BMI 57.27 kg/m2  SpO2 93%  LMP 05/21/2015 (Approximate)  HEMODYNAMICS:    VENTILATOR SETTINGS: Vent Mode:  [-] PRVC FiO2 (%):  [40 %-70 %] 40 % Set Rate:  [24 bmp] 24 bmp Vt Set:  [460 mL] 460 mL PEEP:  [8 cmH20] 8 cmH20 Plateau Pressure:  [31 cmH20-36 cmH20] 31 cmH20  INTAKE / OUTPUT: I/O last 3 completed shifts: In: 5329.3 [I.V.:1379.1; NG/GT:300.2; IV Piggyback:3650] Out: 1000 [Urine:1000]   PHYSICAL EXAMINATION: General: Morbidly obese female, intubated, sedated Neuro:RASS-2 she is non-focal but obtunded, wakes for only seconds then back off to sleep  HEENT: PERLA, (-) NVD. Airway class 4. (-) oral thrush Cardiovascular: s1/s2. (-) s3/m/r/g Lungs: Dec BS on BLF.  Abdomen: (+) BS, soft, obese, distended. (-) masses Musculoskeletal: Gr 1 edema. (-) clubbing/cyanosis Skin: warm and dry.   LABS:  BMET  Recent Labs Lab 08/19/15 0233 08/19/15 1700 08/20/15 0440  NA 137 137 138  K 6.2* 3.7 3.9  CL 100* 102 101  CO2 33* 29 29  BUN 24* 29* 31*  CREATININE 2.13* 2.06* 1.86*  GLUCOSE 123* 98 114*    Electrolytes  Recent Labs Lab 08/19/15 0233 08/19/15 0745 08/19/15 1700 08/19/15 1755 08/20/15 0440  CALCIUM 7.9*  --  8.3*  --   8.3*  MG 1.9 1.9  --  1.6* 2.0  PHOS 7.0* 6.3*  --  3.1 2.7    CBC  Recent Labs Lab 08/18/15 1942 08/19/15 0233 08/20/15 0440  WBC 11.4* 10.9* 10.2  HGB 13.0 13.2 11.7*  HCT 44.9 45.2 37.5  PLT 118* 126* 94*    Coag's No results for input(s): APTT, INR in the last 168 hours.  Sepsis Markers  Recent Labs Lab 08/18/15 1942 08/18/15 1943 08/18/15 2207 08/19/15 0233 08/20/15 0440  LATICACIDVEN  --  1.1 0.7  --   --   PROCALCITON <0.10  --   --  0.66 0.73    ABG  Recent Labs Lab 08/19/15 0410 08/19/15 1130 08/20/15 0347  PHART 7.099* 7.337* 7.438  PCO2ART ABOVE REPORTABLE RANGE 53.8* 40.5  PO2ART 79.1* 138* 61.3*    Liver Enzymes No results for input(s): AST, ALT, ALKPHOS, BILITOT, ALBUMIN in the last 168 hours.  Cardiac Enzymes  Recent Labs Lab 08/19/15 0233 08/19/15 0745 08/19/15 1420  TROPONINI 0.03 0.03 0.12*    Glucose  Recent Labs Lab 08/19/15 1730 08/19/15 1948 08/19/15 2226 08/19/15 2350 08/20/15 0423 08/20/15 0820  GLUCAP 80 56* 101* 115* 113* 98    Imaging Dg Abd 1 View  08/19/2015  CLINICAL DATA:  Orogastric tube placement. EXAM: ABDOMEN - 1 VIEW COMPARISON:  None. FINDINGS: Orogastric tube tip projects in the distal  stomach. IMPRESSION: Well-positioned orogastric tube. Electronically Signed   By: Amie Portland M.D.   On: 08/19/2015 16:02   Dg Chest Port 1 View  08/20/2015  CLINICAL DATA:  Respiratory failure. EXAM: PORTABLE CHEST 1 VIEW COMPARISON:  08/19/2015 FINDINGS: N tracheal tube tip is approximately 3.5 cm above the carina. Lungs show lower volumes bilaterally with potential mild interstitial edema. There remains significant cardiomegaly. There may be a component of small pleural effusions bilaterally. IMPRESSION: Lower lung volumes with potential mild interstitial edema. Stable cardiomegaly and potential bilateral small pleural effusions. Electronically Signed   By: Irish Lack M.D.   On: 08/20/2015 08:55   Dg Chest  Port 1 View  08/19/2015  CLINICAL DATA:  eval for central line placement. 45 y.o. female with a hx of HTN, Asthma, Diastolic CHF, presented to the Emergency Department several days ago complaining of worsening shortness of breath for the past 3 days. Notes shortness of breath for the past few weeks with admission on 07-22-15 due to Acute Diastolic CHF. Malignant HTN. Morbid obesity EXAM: PORTABLE CHEST 1 VIEW COMPARISON:  08/18/2015 FINDINGS: Marked cardiomegaly, stable. No mediastinal or hilar masses. Lung volumes are low. There is mild hazy opacity at the lung bases likely combination small effusions and atelectasis. Pulmonary vascular congestion without overt pulmonary edema. No convincing pneumonia. No pneumothorax. Since prior study, endotracheal tube has been placed. Tip projects 3.5 cm above the chronic. There is a new left internal jugular central venous line with its tip at the caval atrial junction. IMPRESSION: 1. Endotracheal tube and left internal jugular central venous line are well positioned. 2. No pneumothorax. 3. Cardiomegaly, central vascular congestion, probable small effusions and lung base atelectasis. Lung base opacity mildly increased from the previous day's study. No convincing pneumonia or overt pulmonary edema. Electronically Signed   By: Amie Portland M.D.   On: 08/19/2015 11:47     STUDIES:  CXR 6/21 > vascular congestion.  CULTURES: MRSA 6/22 > Blood culture 6/22 > ng  ANTIBIOTICS: None.  SIGNIFICANT EVENTS: 6/21 > admit. 6/22 > transferred to ICU and started on BiPAP/NIV for respiratory acidosis / hypoxemic and hypercarbic respiratory failure. 6/23 worse   LINES/TUBES: None.  DISCUSSION: 45 y.o. F admitted 6/21 with acute hypoxic respiratory failure, CHF exacerbation, and AKI.  Developed hypersomnolence 06/22 and found to have significant hypercarbic respiratory failure so transferred to ICU and started on BiPAP.  ASSESSMENT / PLAN:  PULMONARY A: Acute on  chronic hypoxemic and hypercarbic respiratory failure 2/2 OHS, Likely OSA, Diastolic Dysfxn -failed NIV H/O asthma in mild exacerbation Tobacco use disorder.  P:   Keep PEEP 8 Pulmicort neb bid and duoneb q4.  Unclear if she may need nocturnal ventilation long term SBTs as tolerated   CARDIOVASCULAR A:  dCHF with exacerbation - on last discharge May 2017 weight was 321lbs, now up to 327lbs-->EF preserved by echo on 5/26 Pulmonary Edema  Hx HTN; but HYPOTENSIVE 6/23-->? R/t acidosis  P:   Off pressors   RENAL A:   AKI-->worse as of 6/23 Hyperkalemia-->got Kayexalate 6/23 P:   Correct acidosis  Hold lasix until cr inmproves Strict I&O  GASTROINTESTINAL A:   Morbid obesity. Nutrition. P:   Ct TFs  HEMATOLOGIC A:   Thrombocytopenia - chronic. VTE Prophylaxis. P:  Monitor platelet counts. SCD's / heparin. CBC in AM.  INFECTIOUS A:   No indication of infection. P:   Monitor clinically.  Hold off on abx. -low pct reassuring Await resp cx  ENDOCRINE A:  No acute issues.   P:   Monitor glucose on BMP. SSI q 4 while NPO  NEUROLOGIC A:   Acute hypercarbic encephalopathy. P:   RASS goal -2 Dc versed - use propofol gtt instead + fent gtt  Family updated: Pt has a girlfriend [name] and she made her Management consultant.  Interdisciplinary Family Meeting v Palliative Care Meeting:  Due by: 06/28.  CC time: 35 minutes.   Cyril Mourning MD. Tonny Bollman. [place] Pulmonary & Critical care Pager [phone] If no response call [phone]   08/20/2015

## 2015-08-20 NOTE — Progress Notes (Signed)
Wasted 10 mL of fentanyl in sink with Angelique Blonder RN Heloise Purpura RN

## 2015-08-21 ENCOUNTER — Inpatient Hospital Stay (HOSPITAL_COMMUNITY): Payer: Self-pay

## 2015-08-21 DIAGNOSIS — E662 Morbid (severe) obesity with alveolar hypoventilation: Secondary | ICD-10-CM

## 2015-08-21 DIAGNOSIS — D696 Thrombocytopenia, unspecified: Secondary | ICD-10-CM

## 2015-08-21 LAB — GLUCOSE, CAPILLARY
GLUCOSE-CAPILLARY: 113 mg/dL — AB (ref 65–99)
GLUCOSE-CAPILLARY: 89 mg/dL (ref 65–99)
Glucose-Capillary: 90 mg/dL (ref 65–99)
Glucose-Capillary: 126 mg/dL — ABNORMAL HIGH (ref 65–99)
Glucose-Capillary: 104 mg/dL — ABNORMAL HIGH (ref 65–99)

## 2015-08-21 LAB — BASIC METABOLIC PANEL
ANION GAP: 6 (ref 5–15)
BUN: 35 mg/dL — ABNORMAL HIGH (ref 6–20)
CHLORIDE: 102 mmol/L (ref 101–111)
CO2: 30 mmol/L (ref 22–32)
Calcium: 8.3 mg/dL — ABNORMAL LOW (ref 8.9–10.3)
Creatinine, Ser: 1.28 mg/dL — ABNORMAL HIGH (ref 0.44–1.00)
GFR calc Af Amer: 58 mL/min — ABNORMAL LOW (ref >60)
GFR, EST NON AFRICAN AMERICAN: 50 mL/min — AB (ref >60)
GLUCOSE: 94 mg/dL (ref 65–99)
POTASSIUM: 4 mmol/L (ref 3.5–5.1)
Sodium: 138 mmol/L (ref 135–145)

## 2015-08-21 LAB — CBC
HEMATOCRIT: 35.1 % — AB (ref 36.0–46.0)
HEMOGLOBIN: 11.3 g/dL — AB (ref 12.0–15.0)
MCH: 31.1 pg (ref 26.0–34.0)
MCHC: 32.2 g/dL (ref 30.0–36.0)
MCV: 96.7 fL (ref 78.0–100.0)
Platelets: 63 10*3/uL — ABNORMAL LOW (ref 150–400)
RBC: 3.63 MIL/uL — ABNORMAL LOW (ref 3.87–5.11)
RDW: 15 % (ref 11.5–15.5)
WBC: 8.2 10*3/uL (ref 4.0–10.5)

## 2015-08-21 LAB — TRIGLYCERIDES: TRIGLYCERIDES: 139 mg/dL (ref <150)

## 2015-08-21 MED ORDER — FUROSEMIDE 10 MG/ML IJ SOLN
40.0000 mg | Freq: Every day | INTRAMUSCULAR | Status: DC
  Administered 2015-08-21 – 2015-08-23 (×3): 40 mg via INTRAVENOUS
  Filled 2015-08-21 (×3): qty 4

## 2015-08-21 MED ORDER — PIPERACILLIN-TAZOBACTAM 3.375 G IVPB
3.3750 g | Freq: Three times a day (TID) | INTRAVENOUS | Status: DC
  Administered 2015-08-21 – 2015-08-24 (×9): 3.375 g via INTRAVENOUS
  Filled 2015-08-21 (×9): qty 50

## 2015-08-21 MED ORDER — VANCOMYCIN HCL 10 G IV SOLR
1250.0000 mg | Freq: Two times a day (BID) | INTRAVENOUS | Status: DC
  Administered 2015-08-22 – 2015-08-23 (×4): 1250 mg via INTRAVENOUS
  Filled 2015-08-21 (×5): qty 1250

## 2015-08-21 MED ORDER — FUROSEMIDE 10 MG/ML IJ SOLN: 40.0000 mg | Freq: Two times a day (BID) | INTRAMUSCULAR | Status: DC

## 2015-08-21 MED ORDER — LIP MEDEX EX OINT
TOPICAL_OINTMENT | CUTANEOUS | Status: AC
  Administered 2015-08-21: 18:00:00
  Filled 2015-08-21: qty 7

## 2015-08-21 MED ORDER — VANCOMYCIN HCL 10 G IV SOLR
2500.0000 mg | INTRAVENOUS | Status: AC
  Administered 2015-08-21: 2500 mg via INTRAVENOUS
  Filled 2015-08-21: qty 500

## 2015-08-21 MED ORDER — PIPERACILLIN-TAZOBACTAM 3.375 G IVPB
3.3750 g | Freq: Once | INTRAVENOUS | Status: AC
  Administered 2015-08-21: 3.375 g via INTRAVENOUS
  Filled 2015-08-21: qty 50

## 2015-08-21 NOTE — Progress Notes (Signed)
CRITICAL VALUE ALERT  Critical value received:  Resp. Culture: WBC, both PMN and mononuclear, present. Abundant gram positive cocci in pairs and clusters.  Date of notification:  08/21/15  Time of notification:  2010  Critical value read back:Yes.    Nurse who received alert:  Rutha Bouchard, RN  MD notified (1st page):  E-Link MD  Time of first page:  2016  MD notified (2nd page):  Time of second page:  Responding MD:  E-Link MD  Time MD responded:  2016

## 2015-08-21 NOTE — Progress Notes (Signed)
PULMONARY / CRITICAL CARE MEDICINE   Name: Jennifer Owens MRN: [id number] DOB: 1971/01/22    ADMISSION DATE:  08/17/2015 CONSULTATION DATE:  08/18/15  REFERRING MD:  TRH  CHIEF COMPLAINT:  SOB  HISTORY OF PRESENT ILLNESS:  Jennifer Owens is a 45 y.o. female admitted on 6/21 w/ working dx of acute on Chronic hypercarbic respiratory failure in the setting of decompensated diastolic heart failure, pulmonary edema, and Cor Pulmonale. Placed on NIPPV and treated w/ IV diuresis/ no sig improvement. PCCM assuming care as of 6/23.    SUBJECTIVE:  On propofol gtt febrile UO ok   VITAL SIGNS: BP 103/60 mmHg  Pulse 81  Temp(Src) 100.7 F (38.2 C) (Oral)  Resp 22  Ht 5\' 5"  (1.651 m)  Wt 340 lb 6.2 oz (154.4 kg)  BMI 56.64 kg/m2  SpO2 96%  LMP 05/21/2015 (Approximate)  HEMODYNAMICS: CVP:  [10 mmHg-15 mmHg] 10 mmHg  VENTILATOR SETTINGS: Vent Mode:  [-] PRVC FiO2 (%):  [40 %] 40 % Set Rate:  [24 bmp] 24 bmp Vt Set:  [460 mL] 460 mL PEEP:  [8 cmH20] 8 cmH20 Plateau Pressure:  [29 cmH20-33 cmH20] 29 cmH20  INTAKE / OUTPUT: I/O last 3 completed shifts: In: 3228.7 [I.V.:928.6; NG/GT:2250.2; IV Piggyback:50] Out: 2315 [Urine:2315]   PHYSICAL EXAMINATION: General: Morbidly obese female, intubated, sedated Neuro:RASS-1, easily aroused, non focal HEENT: PERLA, (-) NVD. Airway class 4. (-) oral thrush Cardiovascular: s1/s2. (-) s3/m/r/g Lungs: Dec BS on BLF.  Abdomen: (+) BS, soft, obese, distended. (-) masses Musculoskeletal: Gr 1 edema. (-) clubbing/cyanosis Skin: warm and dry.   LABS:  BMET  Recent Labs Lab 08/19/15 1700 08/20/15 0440 08/21/15 0412  NA 137 138 138  K 3.7 3.9 4.0  CL 102 101 102  CO2 29 29 30   BUN 29* 31* 35*  CREATININE 2.06* 1.86* 1.28*  GLUCOSE 98 114* 94    Electrolytes  Recent Labs Lab 08/19/15 1700 08/19/15 1755 08/20/15 0440 08/20/15 1810 08/21/15 0412  CALCIUM 8.3*  --  8.3*  --  8.3*  MG  --  1.6* 2.0 2.0  --   PHOS  --  3.1  2.7 2.3*  --     CBC  Recent Labs Lab 08/19/15 0233 08/20/15 0440 08/21/15 0412  WBC 10.9* 10.2 8.2  HGB 13.2 11.7* 11.3*  HCT 45.2 37.5 35.1*  PLT 126* 94* 63*    Coag's No results for input(s): APTT, INR in the last 168 hours.  Sepsis Markers  Recent Labs Lab 08/18/15 1942 08/18/15 1943 08/18/15 2207 08/19/15 0233 08/20/15 0440  LATICACIDVEN  --  1.1 0.7  --   --   PROCALCITON <0.10  --   --  0.66 0.73    ABG  Recent Labs Lab 08/19/15 0410 08/19/15 1130 08/20/15 0347  PHART 7.099* 7.337* 7.438  PCO2ART ABOVE REPORTABLE RANGE 53.8* 40.5  PO2ART 79.1* 138* 61.3*    Liver Enzymes No results for input(s): AST, ALT, ALKPHOS, BILITOT, ALBUMIN in the last 168 hours.  Cardiac Enzymes  Recent Labs Lab 08/19/15 0233 08/19/15 0745 08/19/15 1420  TROPONINI 0.03 0.03 0.12*    Glucose  Recent Labs Lab 08/20/15 1222 08/20/15 1628 08/20/15 2043 08/20/15 2328 08/21/15 0400 08/21/15 0824  GLUCAP 128* 97 107* 113* 90 126*    Imaging Dg Chest Port 1 View  08/21/2015  CLINICAL DATA:  Acute hypoxemic respiratory failure EXAM: PORTABLE CHEST 1 VIEW COMPARISON:  Chest radiograph from one day prior. FINDINGS: Endotracheal tube tip is 4.0 cm above  the carina. Enteric tube enters lower thoracic esophagus with the tip not seen on this image. Left internal jugular central venous catheter terminates at the cavoatrial junction. Stable cardiomediastinal silhouette with moderate cardiomegaly. No pneumothorax. Stable small bilateral pleural effusions. Stable diffuse hazy lung opacities. Low lung volumes and bibasilar atelectasis appears stable. IMPRESSION: 1. Support structures as described.  No pneumothorax . 2. Stable severe congestive heart failure and small bilateral pleural effusions. 3. Stable low lung volumes with bibasilar atelectasis. Electronically Signed   By: Delbert Phenix M.D.   On: 08/21/2015 07:52     STUDIES:  CXR 6/21 > vascular  congestion.  CULTURES: MRSA 6/22 > Blood culture 6/22 > ng  ANTIBIOTICS: None.  SIGNIFICANT EVENTS: 6/21 > admit. 6/22 > transferred to ICU and started on BiPAP/NIV for respiratory acidosis / hypoxemic and hypercarbic respiratory failure. 6/23 worse   LINES/TUBES: ETT 6/23>> LIJ 6/23 >>  DISCUSSION:  Not weaning, CXR appears wet vs ASD  ASSESSMENT / PLAN:  PULMONARY A: Acute on chronic hypoxemic and hypercarbic respiratory failure 2/2 OHS, Likely OSA, Diastolic Dysfxn -failed NIV H/O asthma in mild exacerbation Tobacco use disorder.  P:   Keep PEEP 8 Pulmicort neb bid and duoneb q4. SBTs as tolerated  Unclear if she may need nocturnal ventilation long term  CARDIOVASCULAR A:  dCHF with exacerbation - on last discharge May 2017 weight was 321lbs, now up to 327lbs-->EF preserved by echo on 5/26 Pulmonary Edema  Hx HTN; but HYPOTENSIVE 6/23-->? R/t acidosis  P:   Off pressors   RENAL A:   AKI-->worse as of 6/23 Hyperkalemia-->got Kayexalate 6/23 P:    lasix 40 daily now that  cr inmproved Strict I&O  GASTROINTESTINAL A:   Morbid obesity. Nutrition. P:   Ct TFs  HEMATOLOGIC A:   Thrombocytopenia - chronic. VTE Prophylaxis. P:  Monitor platelet counts- dc heparin, chk HIT panel for completion SCD's  CBC in AM.  INFECTIOUS A:   Doubt HCAP P:   Held off on abx initially -low pct reassuring, but now start zosyn empiric for HCAP Await resp cx  ENDOCRINE A:   No acute issues.   P:   Monitor glucose on BMP. SSI q 4 while NPO  NEUROLOGIC A:   Acute hypercarbic encephalopathy. P:   RASS goal -2 use propofol gtt (instead of versed) + fent gtt Consider precedex when more ready to wean  Family updated: Pt has a girlfriend [name] and she made her decision maker -updated 6/25  Interdisciplinary Family Meeting v Palliative Care Meeting:  Due by: 06/28.  CC time: 35 minutes.   Cyril Mourning MD. Ton[facility] Bollman. [facility] Pager [phone] If no response call 319 [phone]   08/21/2015

## 2015-08-21 NOTE — Progress Notes (Signed)
Pharmacy Antibiotic Follow-up Note  Jennifer Owens is a 45 y.o. year-old female admitted on 08/17/2015.  The patient is currently on day 1 of Zosyn for HAP.  Assessment/Plan: Zosyn 3.375g IV q8h (4 hour infusion).  Temp (24hrs), Avg:99.8 F (37.7 C), Min:99.3 F (37.4 C), Max:100.7 F (38.2 C)   Recent Labs Lab 08/17/15 1600 08/18/15 1942 08/19/15 0233 08/20/15 0440 08/21/15 0412  WBC 7.2 11.4* 10.9* 10.2 8.2    Recent Labs Lab 08/18/15 1942 08/19/15 0233 08/19/15 1700 08/20/15 0440 08/21/15 0412  CREATININE 1.60* 2.13* 2.06* 1.86* 1.28*   Estimated Creatinine Clearance: 84.1 mL/min (by C-G formula based on Cr of 1.28).    No Known Allergies  Antimicrobials this admission: 6/25 Zosyn >>   Microbiology results: 6/22 BCx: ngtd 6/22 MRSA PCR: neg 6/23 Strep pneumo: neg  Thank you for allowing pharmacy to be a part of this patient's care.  Otho Bellows PharmD 08/21/2015 10:33 AM

## 2015-08-21 NOTE — Progress Notes (Signed)
Pharmacy Antibiotic Note  Jennifer Owens is a 45 y.o. female admitted on 08/17/2015 with respiratory failure.  Pharmacy was consulted earlier today to dose Zosyn for HAP, and is now consulted to add vancomycin since tracheal aspirate culture (+) GPC pairs and clusters.  Plan:  Vancomycin 2.5g IV x 1, then 1250mg  IV q12h Check trough at steady state, goal 15-20 mcg/ml Continue Zosyn 3.375gm IV q8h (4hr extended infusions) as previously ordered Follow up renal function & cultures, clinical course  Height: 5\' 5"  (165.1 cm) Weight: (!) 340 lb 6.2 oz (154.4 kg) IBW/kg (Calculated) : 57  Temp (24hrs), Avg:99.4 F (37.4 C), Min:98 F (36.7 C), Max:100.7 F (38.2 C)   Recent Labs Lab 08/17/15 1600  08/18/15 1942 08/18/15 1943 08/18/15 2207 08/19/15 0233 08/19/15 1700 08/20/15 0440 08/21/15 0412  WBC 7.2  --  11.4*  --   --  10.9*  --  10.2 8.2  CREATININE 1.12*  < > 1.60*  --   --  2.13* 2.06* 1.86* 1.28*  LATICACIDVEN  --   --   --  1.1 0.7  --   --   --   --   < > = values in this interval not displayed.  Estimated Creatinine Clearance: 84.1 mL/min (by C-G formula based on Cr of 1.28).    No Known Allergies  Antimicrobials this admission: 6/25 Zosyn >>  6/25 Vancomycin >>  Microbiology results: 6/22 BCx: ngtd 6/22 MRSA PCR: neg 6/23 Strep pneumo: neg 6/25 Trach asp: GPC pairs/clusters  Thank you for allowing pharmacy to be a part of this patient's care.  Loralee Pacas, PharmD, BCPS Pager: [phone] 08/21/2015 8:56 PM

## 2015-08-21 NOTE — Progress Notes (Signed)
eLink Physician-Brief Progress Note Patient Name: Jennifer Owens DOB: 1970/03/01 MRN: [id number]   Date of Service  08/21/2015  HPI/Events of Note  Multiple issues: 1. Tracheal aspirate culture from 08/21/2015 -  Gram stain >> GRAM POSITIVE COCCI IN PAIRS IN CLUSTERS and 2. Patient is on a Propofol IV infusion.   eICU Interventions  Will order: 1. Vancomycin per pharmacy consultation. 2. Triglyceride level in AM.     Intervention Category Intermediate Interventions: Infection - evaluation and management  Mj Willis Eugene 08/21/2015, 8:49 PM

## 2015-08-22 ENCOUNTER — Inpatient Hospital Stay (HOSPITAL_COMMUNITY): Payer: Self-pay

## 2015-08-22 LAB — LEGIONELLA PNEUMOPHILA SEROGP 1 UR AG: L. pneumophila Serogp 1 Ur Ag: NEGATIVE

## 2015-08-22 LAB — CBC
HCT: 34.8 % — ABNORMAL LOW (ref 36.0–46.0)
Hemoglobin: 10.8 g/dL — ABNORMAL LOW (ref 12.0–15.0)
MCH: 30.3 pg (ref 26.0–34.0)
MCHC: 31 g/dL (ref 30.0–36.0)
MCV: 97.5 fL (ref 78.0–100.0)
PLATELETS: 48 10*3/uL — AB (ref 150–400)
RBC: 3.57 MIL/uL — ABNORMAL LOW (ref 3.87–5.11)
RDW: 15.2 % (ref 11.5–15.5)
WBC: 8.9 10*3/uL (ref 4.0–10.5)

## 2015-08-22 LAB — BASIC METABOLIC PANEL
Anion gap: 7 (ref 5–15)
BUN: 32 mg/dL — AB (ref 6–20)
CHLORIDE: 103 mmol/L (ref 101–111)
CO2: 29 mmol/L (ref 22–32)
CREATININE: 1.14 mg/dL — AB (ref 0.44–1.00)
Calcium: 8.2 mg/dL — ABNORMAL LOW (ref 8.9–10.3)
GFR calc Af Amer: >60 mL/min (ref >60)
GFR calc non Af Amer: 57 mL/min — ABNORMAL LOW (ref >60)
Glucose, Bld: 109 mg/dL — ABNORMAL HIGH (ref 65–99)
Potassium: 3.7 mmol/L (ref 3.5–5.1)
SODIUM: 139 mmol/L (ref 135–145)

## 2015-08-22 LAB — GLUCOSE, CAPILLARY
GLUCOSE-CAPILLARY: 104 mg/dL — AB (ref 65–99)
Glucose-Capillary: 110 mg/dL — ABNORMAL HIGH (ref 65–99)
Glucose-Capillary: 120 mg/dL — ABNORMAL HIGH (ref 65–99)
Glucose-Capillary: 97 mg/dL (ref 65–99)
Glucose-Capillary: 100 mg/dL — ABNORMAL HIGH (ref 65–99)
Glucose-Capillary: 98 mg/dL (ref 65–99)
Glucose-Capillary: 99 mg/dL (ref 65–99)
Glucose-Capillary: 81 mg/dL (ref 65–99)

## 2015-08-22 LAB — TRIGLYCERIDES: Triglycerides: 154 mg/dL — ABNORMAL HIGH (ref <150)

## 2015-08-22 MED ORDER — FUROSEMIDE 10 MG/ML IJ SOLN
40.0000 mg | Freq: Once | INTRAMUSCULAR | Status: AC
  Administered 2015-08-22: 40 mg via INTRAVENOUS
  Filled 2015-08-22: qty 4

## 2015-08-22 MED ORDER — VITAL HIGH PROTEIN PO LIQD
1000.0000 mL | ORAL | Status: DC
  Administered 2015-08-23 – 2015-08-24 (×2): 1000 mL
  Filled 2015-08-22 (×2): qty 1000

## 2015-08-22 MED ORDER — PRO-STAT SUGAR FREE PO LIQD
60.0000 mL | Freq: Three times a day (TID) | ORAL | Status: DC
  Administered 2015-08-22 – 2015-08-23 (×3): 60 mL
  Filled 2015-08-22 (×3): qty 60

## 2015-08-22 NOTE — Progress Notes (Signed)
Nutrition Follow-up  DOCUMENTATION CODES:   Morbid obesity  INTERVENTION:  - Will change TF order: Vital High Protein @ 15 mL/hr with 60 mL Prostat TID. This regimen + kcal from current Propofol rate provides 1454 kcal, 122 grams of protein (86% estimated protein need), and 301 mL free water. - Continue current free water flush (unless changed by MD/NP) of 200 mL free water every 6 hours providing 400 mL free water. - Continue PEPup protocol - RD will follow-up 6/27.  NUTRITION DIAGNOSIS:   Inadequate oral intake related to inability to eat as evidenced by NPO status. -ongoing  GOAL:   Provide needs based on ASPEN/SCCM guidelines -met  MONITOR:   Vent status, TF tolerance, Weight trends, Labs, Skin, I & O's  ASSESSMENT:   45 y.o. female with medical history significant of recent diagnosis gradfe 1 diastolic heart failure on recent hospitalization. Discharged from [facility] on May 29 and generally feeling well until about 4 days ago. She began feeling sluggish like the previous presentation. Symptoms worse when outside and in the humidity - significant difficulty catching her breath. Would feel nauseated. Symptoms ongoing the last 4 days. Today, she woke up and turned off the [facility] in the house (had also turned off her fan in her room last night). Went from bed to bathroom and was completely exhausted. With these symptoms, she decided to come back in. No chest pain/pressure other than following late-night eating a few days ago which led to indigestion. Does experience cough when she gets very hot or feels congested. +LE edema. Has not obtained a scale for daily weights - she was told to take Lasix if her weight increased more than 2 pounds/day. She did take the Lasix a couple of times and she did notice improvement, but she didn't take more (last took last night) because she was noticing some cramping of legs/fingers/abdomen. Also has been trying to fluid restrict and so concerned that  she wasn't taking in enough fluid. Does note ongoing nausea following meals, and this is new for her.  6/26 No family/visitors present at this time. OGT remains in place with TF running per goal rate order: Vital High Protein @ 40 mL/hr + 60 mL Prostat BID and 200 mL free water every 6 hours which is providing 1360 kcal, 144 grams of protein, and 1203 mL free water. Weight down 3 lbs since assessment 6/23; will continue to monitor weight trends.   Patient is currently intubated on ventilator support MV: 11.7 L/min Temp (24hrs), Avg:99.1 F (37.3 C), Min:98 F (36.7 C), Max:99.9 F (37.7 C) Propofol: 18.7 ml/hr (494 kcal)   Per Dr. Matilde Bash note today at 0930: pt off pressors and plan for wakeup and breath assessments. Will change TF order as outlined above given current Propofol rate. RD will follow-up tomorrow.   Medications reviewed; 3.375 g IV Zosyn every 8 hours, 40 mg IV Lasix/day, sliding scale Novolog, 40 mg Protonix/day,   Drip: Propofol @ 20 mcg/kg/min.     6/23 - Per Dr. Aldine Contes note 6/22 at 1255: Upon discharge, patient should discuss weight loss and lifestyle modifications with her primary care physician.  - She was initially placed on BiPAP for trial due ABG last night and was subsequently intubated this AM.  - Per Dr. Juanetta Gosling note today at [place]: May need trach, and wonder about nocturnal ventilation long term. - Family in and out of room at time of RD visit. - Unable to discuss pt from PTA with them due to their  conversation with RN and needing to leave. - Patient is currently intubated on ventilator support; MV: 9.8-11.6 L/min during time of visit; Temp (24hrs), Max:98.5 F (36.9 C); Propofol: none - Physical assessment shows no muscle or fat wasting to upper or lower body. Mild edema noted and notes indicate +1 edema on admission.  - Per chart review, weight up 16 lbs since admission which is likely fluid related given information outlined in H&P.  - K: 6.2 mmol/L,  Phos: 6.3 mg/dL.  Drips: Fentanyl @ 25 mcg/hr, Versed @ 2.5 mg/hr, Neo @ 60 mcg/min (at time of visit at 1040).   Diet Order:  Diet NPO time specified  Skin:  Reviewed, no issues  Last BM:  6/23  Height:   Ht Readings from Last 1 Encounters:  08/19/15 5' 5" (1.651 m)    Weight:   Wt Readings from Last 1 Encounters:  08/22/15 336 lb 10.3 oz (152.7 kg)    Ideal Body Weight:  56.82 kg (kg)  BMI:  Body mass index is 56.02 kg/(m^2).  Estimated Nutritional Needs:   Kcal:  1250-1420 (22-25 kcal/kg IBW)  Protein:  142 grams (2.5 grams/kg IBW)  Fluid:  >/= 1.5 L/day  EDUCATION NEEDS:   No education needs identified at this time     Jarome Matin, MS, RD, LDN Inpatient Clinical Dietitian Pager # [phone] After hours/weekend pager # [phone]

## 2015-08-22 NOTE — Progress Notes (Signed)
eLink Physician-Brief Progress Note Patient Name: Jennifer Owens DOB: 1970/04/01 MRN: [id number]   Date of Service  08/22/2015  HPI/Events of Note  Good diuresis after lasix 40 earlier. CVP measured this pm at 24. Renal fxn stable, will give a single additional dose 40 lasix now  eICU Interventions       Intervention Category Major Interventions: Respiratory failure - evaluation and management  BYRUM,ROBERT S. 08/22/2015, 5:09 PM

## 2015-08-22 NOTE — Progress Notes (Signed)
PULMONARY / CRITICAL CARE MEDICINE   Name: Jennifer Owens MRN: [id number] DOB: 02/12/1971    ADMISSION DATE:  08/17/2015 CONSULTATION DATE:  08/18/15  REFERRING MD:  TRH  CHIEF COMPLAINT:  SOB  HISTORY OF PRESENT ILLNESS:  Jennifer Owens is a 45 y.o. female admitted on 6/21 w/ working dx of acute on Chronic hypercarbic respiratory failure in the setting of decompensated diastolic heart failure, pulmonary edema, and Cor Pulmonale. Placed on NIPPV and treated w/ IV diuresis/ no sig improvement. PCCM assumed care as of 6/23.   SUBJECTIVE:  No issues overnight.  VITAL SIGNS: BP 110/66 mmHg  Pulse 68  Temp(Src) 99 F (37.2 C) (Oral)  Resp 24  Ht 5\' 5"  (1.651 m)  Wt 336 lb 10.3 oz (152.7 kg)  BMI 56.02 kg/m2  SpO2 96%  LMP 05/21/2015 (Approximate)  HEMODYNAMICS: CVP:  [13 mmHg-21 mmHg] 16 mmHg  VENTILATOR SETTINGS: Vent Mode:  [-] PRVC FiO2 (%):  [40 %] 40 % Set Rate:  [24 bmp] 24 bmp Vt Set:  [460 mL] 460 mL PEEP:  [6 cmH20-8 cmH20] 6 cmH20 Plateau Pressure:  [28 cmH20-36 cmH20] 29 cmH20  INTAKE / OUTPUT: I/O last 3 completed shifts: In: 4351 [I.V.:1301; NG/GT:2400; IV Piggyback:650] Out: 4510 [Urine:4510]   PHYSICAL EXAMINATION: General: Morbidly obese female, no distress Neuro:Awakes to commands. No focal deficits HEENT: PERRLA, no thyromegaly, JVD Cardiovascular: S1, S2, RRR, No MRG Lungs: Clear, no wheeze or crackles Abdomen: + BS, Non tender, non distended Musculoskeletal: trace edema Skin: warm and dry.   LABS:  BMET  Recent Labs Lab 08/20/15 0440 08/21/15 0412 08/22/15 0400  NA 138 138 139  K 3.9 4.0 3.7  CL 101 102 103  CO2 29 30 29   BUN 31* 35* 32*  CREATININE 1.86* 1.28* 1.14*  GLUCOSE 114* 94 109*    Electrolytes  Recent Labs Lab 08/19/15 1755 08/20/15 0440 08/20/15 1810 08/21/15 0412 08/22/15 0400  CALCIUM  --  8.3*  --  8.3* 8.2*  MG 1.6* 2.0 2.0  --   --   PHOS 3.1 2.7 2.3*  --   --     CBC  Recent Labs Lab  08/20/15 0440 08/21/15 0412 08/22/15 0400  WBC 10.2 8.2 8.9  HGB 11.7* 11.3* 10.8*  HCT 37.5 35.1* 34.8*  PLT 94* 63* 48*    Coag's No results for input(s): APTT, INR in the last 168 hours.  Sepsis Markers  Recent Labs Lab 08/18/15 1942 08/18/15 1943 08/18/15 2207 08/19/15 0233 08/20/15 0440  LATICACIDVEN  --  1.1 0.7  --   --   PROCALCITON <0.10  --   --  0.66 0.73    ABG  Recent Labs Lab 08/19/15 0410 08/19/15 1130 08/20/15 0347  PHART 7.099* 7.337* 7.438  PCO2ART ABOVE REPORTABLE RANGE 53.8* 40.5  PO2ART 79.1* 138* 61.3*    Liver Enzymes No results for input(s): AST, ALT, ALKPHOS, BILITOT, ALBUMIN in the last 168 hours.  Cardiac Enzymes  Recent Labs Lab 08/19/15 0233 08/19/15 0745 08/19/15 1420  TROPONINI 0.03 0.03 0.12*    Glucose  Recent Labs Lab 08/21/15 1211 08/21/15 1543 08/21/15 2059 08/22/15 0054 08/22/15 0353 08/22/15 0722  GLUCAP 89 104* 110* 120* 97 100*    Imaging Dg Chest Port 1 View  08/22/2015  CLINICAL DATA:  Hypoxia EXAM: PORTABLE CHEST 1 VIEW COMPARISON:  August 21, 2015 FINDINGS: Endotracheal tube tip is 3.8 cm above the carina. Central catheter tip is in the superior vena cava near the cavoatrial junction, stable.  Nasogastric tube tip and side port are below the diaphragm. No pneumothorax. There is moderate interstitial and patchy alveolar edema, stable. There are small pleural effusions bilaterally with generalized cardiomegaly and pulmonary venous hypertension. No adenopathy is evident. IMPRESSION: Tube and catheter positions as described without pneumothorax. Persistent changes of congestive heart failure. No new opacity. Stable cardiac silhouette. Electronically Signed   By: Bretta Bang III M.D.   On: 08/22/2015 07:12     STUDIES:  CXR 6/26 > Reviewed. CHF perisists  CULTURES: MRSA 6/22 > Blood culture 6/22 > ng Trach culture 6/25 > GPCs  ANTIBIOTICS: Vanco 6/25>> Zosyn 6/25>  SIGNIFICANT EVENTS: 6/21 >  admit. 6/22 > transferred to ICU and started on BiPAP/NIV for respiratory acidosis / hypoxemic and hypercarbic respiratory failure. 6/23> Intubated  LINES/TUBES: ETT 6/23>> LIJ 6/23 >>  DISCUSSION: 44 Y/O with CHF, +/- PNA  ASSESSMENT / PLAN:  PULMONARY A: Acute on chronic hypoxemic and hypercarbic respiratory failure 2/2 OHS, Likely OSA, Diastolic Dysfxn -failed NIV H/O asthma in mild exacerbation Tobacco use disorder. P:   Wean PEEP Pulmicort neb bid and duoneb q4. Wake up and breath assessments  CARDIOVASCULAR A:  dCHF with exacerbation - on last discharge May 2017 weight was 321lbs, now up to 327lbs-->EF preserved by echo on 5/26 Pulmonary Edema  Hx HTN; but HYPOTENSIVE 6/23-->? R/t acidosis  P:   Off pressors  RENAL A:   AKI--> Improved Hyperkalemia-->got Kayexalate 6/23 P:   Continue lasix Strict I&O  GASTROINTESTINAL A:   Morbid obesity. Nutrition. P:   Continue tube feeds  HEMATOLOGIC A:   Thrombocytopenia - chronic. VTE Prophylaxis. P:  Monitor platelet counts- Off heparin, Check HIT panel SCD's  CBC in AM.  INFECTIOUS A:   ? HCAP P:   Started on vanco and zosyn yesterday Follow Pct  ENDOCRINE A:   No acute issues.   P:   Monitor glucose on BMP. SSI q 4   NEUROLOGIC A:   Acute hypercarbic encephalopathy. P:   RASS goal -2 On propofol gtt + fent gtt Consider precedex when more ready to wean  Family updated: Pt has a girlfriend [name] and she made her decision maker -updated 6/25. No family at bedside 6/26 Interdisciplinary Family Meeting v Palliative Care Meeting:  Due by: 06/28.  CC time: 35 minutes.   Chilton Greathouse MD [facility] Pager [phone] If no answer or after 3pm call: [phone] 08/22/2015, 9:39 AM

## 2015-08-22 NOTE — Progress Notes (Signed)
BPs have been consistently lower than baseline, MD made aware.   Will continue to monitor.

## 2015-08-23 LAB — CBC
HCT: 32.2 % — ABNORMAL LOW (ref 36.0–46.0)
HEMOGLOBIN: 10.1 g/dL — AB (ref 12.0–15.0)
MCH: 30.2 pg (ref 26.0–34.0)
MCHC: 31.4 g/dL (ref 30.0–36.0)
MCV: 96.4 fL (ref 78.0–100.0)
Platelets: 45 10*3/uL — ABNORMAL LOW (ref 150–400)
RBC: 3.34 MIL/uL — ABNORMAL LOW (ref 3.87–5.11)
RDW: 15.3 % (ref 11.5–15.5)
WBC: 8.5 10*3/uL (ref 4.0–10.5)

## 2015-08-23 LAB — BASIC METABOLIC PANEL
ANION GAP: 8 (ref 5–15)
Anion gap: 7 (ref 5–15)
BUN: 38 mg/dL — AB (ref 6–20)
BUN: 40 mg/dL — ABNORMAL HIGH (ref 6–20)
CHLORIDE: 103 mmol/L (ref 101–111)
CHLORIDE: 103 mmol/L (ref 101–111)
CO2: 31 mmol/L (ref 22–32)
CO2: 30 mmol/L (ref 22–32)
CREATININE: 1.24 mg/dL — AB (ref 0.44–1.00)
Calcium: 8.1 mg/dL — ABNORMAL LOW (ref 8.9–10.3)
Calcium: 8.3 mg/dL — ABNORMAL LOW (ref 8.9–10.3)
Creatinine, Ser: 1.25 mg/dL — ABNORMAL HIGH (ref 0.44–1.00)
GFR calc Af Amer: 60 mL/min — ABNORMAL LOW (ref >60)
GFR calc Af Amer: 59 mL/min — ABNORMAL LOW (ref >60)
GFR calc non Af Amer: 52 mL/min — ABNORMAL LOW (ref >60)
GFR calc non Af Amer: 51 mL/min — ABNORMAL LOW (ref >60)
GLUCOSE: 98 mg/dL (ref 65–99)
GLUCOSE: 95 mg/dL (ref 65–99)
POTASSIUM: 3.9 mmol/L (ref 3.5–5.1)
Potassium: 3.8 mmol/L (ref 3.5–5.1)
SODIUM: 141 mmol/L (ref 135–145)
Sodium: 141 mmol/L (ref 135–145)

## 2015-08-23 LAB — CULTURE, BLOOD (ROUTINE X 2)
Culture: NO GROWTH
Culture: NO GROWTH

## 2015-08-23 LAB — GLUCOSE, CAPILLARY
GLUCOSE-CAPILLARY: 101 mg/dL — AB (ref 65–99)
GLUCOSE-CAPILLARY: 114 mg/dL — AB (ref 65–99)
GLUCOSE-CAPILLARY: 99 mg/dL (ref 65–99)
Glucose-Capillary: 99 mg/dL (ref 65–99)
Glucose-Capillary: 119 mg/dL — ABNORMAL HIGH (ref 65–99)

## 2015-08-23 LAB — TROPONIN I: Troponin I: 0.06 ng/mL (ref <0.03)

## 2015-08-23 LAB — PHOSPHORUS: PHOSPHORUS: 3.8 mg/dL (ref 2.5–4.6)

## 2015-08-23 LAB — MAGNESIUM: MAGNESIUM: 1.5 mg/dL — AB (ref 1.7–2.4)

## 2015-08-23 MED ORDER — PRO-STAT SUGAR FREE PO LIQD
30.0000 mL | Freq: Every day | ORAL | Status: DC
  Administered 2015-08-23: 30 mL via ORAL
  Filled 2015-08-23: qty 30

## 2015-08-23 MED ORDER — PRO-STAT SUGAR FREE PO LIQD
60.0000 mL | Freq: Three times a day (TID) | ORAL | Status: DC
  Administered 2015-08-23 – 2015-08-24 (×3): 60 mL via ORAL
  Filled 2015-08-23 (×3): qty 60

## 2015-08-23 MED ORDER — MAGNESIUM SULFATE 2 GM/50ML IV SOLN
2.0000 g | Freq: Once | INTRAVENOUS | Status: AC
  Administered 2015-08-23: 2 g via INTRAVENOUS
  Filled 2015-08-23: qty 50

## 2015-08-23 MED ORDER — POTASSIUM CHLORIDE 20 MEQ/15ML (10%) PO SOLN
20.0000 meq | Freq: Once | ORAL | Status: AC
  Administered 2015-08-23: 20 meq
  Filled 2015-08-23: qty 15

## 2015-08-23 MED ORDER — PRO-STAT SUGAR FREE PO LIQD: 30.0000 mL | Freq: Every day | ORAL | Status: DC

## 2015-08-23 MED ORDER — FUROSEMIDE 10 MG/ML IJ SOLN
40.0000 mg | Freq: Two times a day (BID) | INTRAMUSCULAR | Status: DC
  Administered 2015-08-23 – 2015-08-26 (×6): 40 mg via INTRAVENOUS
  Filled 2015-08-23 (×6): qty 4

## 2015-08-23 NOTE — Clinical Documentation Improvement (Signed)
Critical Care Medicine  Please document query responses in the progress notes and discharge summary, not on the CDI BPA form in [facility]. Thank you!  The patient has an order for the following medication:  Neo Synephrine drip that was infused on 08/19/15.   Please document a corresponding diagnosis that supports the use of this medication:  - Shock, including type and any associated causes and/or conditions  - Other condition  - Unable to clinically determine   Supporting Information: "Persistent hypotension despite IV fluid bolus.  Renal function worsening as well.  Neo-Synephrine low-dose to maintain MA >65 & SBP > 90" documented by dr. Jamison Neighbor 08/19/15 at 4"02 am. MAP in the 30's to 70's on 08/19/15   Please exercise your independent, professional judgment when responding. A specific answer is not anticipated or expected.   Thank You,  Jerral Ralph  RN BSN CCDS [phone] Health Information Management [place]

## 2015-08-23 NOTE — Progress Notes (Signed)
eLink Physician-Brief Progress Note Patient Name: Jennifer Owens DOB: [birth year] MRN: [id number]   Date of Service  08/23/2015  HPI/Events of Note  30 + beat run of VT with intermittent perfusion (on pleth tracing). Mag was 1.5 this AM without replacement ordered  eICU Interventions  1) STAT BMET 2) replace Mag 3) cycle cardiac markers 4) Echocardiogram ordered 5) Consider Cards consultation and /or amiodarone depending on results of above     Intervention Category Major Interventions: Arrhythmia - evaluation and management  Billy Fischer 08/23/2015, 8:57 PM

## 2015-08-23 NOTE — Progress Notes (Signed)
PULMONARY / CRITICAL CARE MEDICINE   Name: Jennifer Owens MRN: [id number] DOB: 17-May-1970    ADMISSION DATE:  08/17/2015 CONSULTATION DATE:  08/18/15  REFERRING MD:  TRH  CHIEF COMPLAINT:  SOB  HISTORY OF PRESENT ILLNESS:  Jennifer Owens is a 45 y.o. female admitted on 6/21 w/ working dx of acute on chronic hypercarbic respiratory failure in the setting of decompensated diastolic heart failure, pulmonary edema, and Cor Pulmonale. Placed on NIPPV and treated w/ IV diuresis/ no sig improvement. PCCM assumed care as of 6/23.   SUBJECTIVE:  Additional diuresis overnight, net negative 800 ml in last 24 hours (+3L for admit).  No acute events.  RN reports PVC's.  Desaturates with PSV weaning 12/6  VITAL SIGNS: BP 104/48 mmHg  Pulse 65  Temp(Src) 99.2 F (37.3 C) (Oral)  Resp 24  Ht 5\' 5"  (1.651 m)  Wt 335 lb 1.6 oz (152 kg)  BMI 55.76 kg/m2  SpO2 97%  LMP 05/21/2015 (Approximate)  HEMODYNAMICS: CVP:  [14 mmHg-24 mmHg] 14 mmHg  VENTILATOR SETTINGS: Vent Mode:  [-] PRVC FiO2 (%):  [40 %] 40 % Set Rate:  [24 bmp-460 bmp] 24 bmp Vt Set:  [460 mL] 460 mL PEEP:  [6 cmH20] 6 cmH20 Plateau Pressure:  [24 cmH20-34 cmH20] 31 cmH20  INTAKE / OUTPUT: I/O last 3 completed shifts: In: 4573.1 [I.V.:1343.1; NG/GT:2030; IV Piggyback:1200] Out: 5005 [Urine:5005]   PHYSICAL EXAMINATION: General: Morbidly obese female, no distress on vent Neuro:  Awakens to voice, follows commands. No focal deficits HEENT: PERRLA, ETT, unable to appreciate JVD due to neck habitus Cardiovascular: S1, S2, RRR, No MRG Lungs: Clear, no wheeze or crackles Abdomen: + BS, Non tender, non distended Musculoskeletal: trace edema Skin: warm and dry.   LABS:  BMET  Recent Labs Lab 08/21/15 0412 08/22/15 0400 08/23/15 0340  NA 138 139 141  K 4.0 3.7 3.8  CL 102 103 103  CO2 30 29 31   BUN 35* 32* 38*  CREATININE 1.28* 1.14* 1.24*  GLUCOSE 94 109* 98    Electrolytes  Recent Labs Lab 08/19/15 1755  08/20/15 0440 08/20/15 1810 08/21/15 0412 08/22/15 0400 08/23/15 0340  CALCIUM  --  8.3*  --  8.3* 8.2* 8.1*  MG 1.6* 2.0 2.0  --   --   --   PHOS 3.1 2.7 2.3*  --   --   --     CBC  Recent Labs Lab 08/21/15 0412 08/22/15 0400 08/23/15 0340  WBC 8.2 8.9 8.5  HGB 11.3* 10.8* 10.1*  HCT 35.1* 34.8* 32.2*  PLT 63* 48* 45*    Coag's No results for input(s): APTT, INR in the last 168 hours.  Sepsis Markers  Recent Labs Lab 08/18/15 1942 08/18/15 1943 08/18/15 2207 08/19/15 0233 08/20/15 0440  LATICACIDVEN  --  1.1 0.7  --   --   PROCALCITON <0.10  --   --  0.66 0.73    ABG  Recent Labs Lab 08/19/15 0410 08/19/15 1130 08/20/15 0347  PHART 7.099* 7.337* 7.438  PCO2ART ABOVE REPORTABLE RANGE 53.8* 40.5  PO2ART 79.1* 138* 61.3*    Liver Enzymes No results for input(s): AST, ALT, ALKPHOS, BILITOT, ALBUMIN in the last 168 hours.  Cardiac Enzymes  Recent Labs Lab 08/19/15 0233 08/19/15 0745 08/19/15 1420  TROPONINI 0.03 0.03 0.12*    Glucose  Recent Labs Lab 08/22/15 1151 08/22/15 1544 08/22/15 2058 08/22/15 2321 08/23/15 0340 08/23/15 0757  GLUCAP 81 98 99 104* 101* 99    Imaging  No results found.   STUDIES:  CXR 6/26 > Reviewed. CHF perisists  CULTURES: MRSA 6/22 > Blood culture 6/22 > ng Trach culture 6/25 > GPCs  ANTIBIOTICS: Vanco 6/25>> Zosyn 6/25>>  SIGNIFICANT EVENTS: 6/21  Admit. 6/22  Transferred to ICU and started on BiPAP/NIV for respiratory acidosis / hypoxemic and hypercarbic respiratory failure. 6/23  Intubated  LINES/TUBES: ETT 6/23 >> LIJ 6/23 >>  DISCUSSION: 44 Y/O with CHF, +/- PNA  ASSESSMENT / PLAN:  PULMONARY A: Acute on chronic hypoxemic and hypercarbic respiratory failure 2/2 OHS, Likely OSA, Diastolic Dysfxn -failed NIV H/O asthma in mild exacerbation Tobacco use disorder. P:   PRVC 8 cc/kg  Wean PEEP/FiO2 for sats >92% Pulmicort neb bid and duoneb q4. Daily WUA / SBT    CARDIOVASCULAR A:  dCHF with exacerbation - on last discharge May 2017 weight was 321lbs, now up to 327lbs-->EF preserved by echo on 5/26 Pulmonary Edema  Hx HTN; but HYPOTENSIVE 6/23-->? R/t acidosis  P:  Cardiac monitoring   RENAL A:   AKI--> Improved Hyperkalemia-->got Kayexalate 6/23 P:   Continue lasix 40 mg QD Strict I&O  GASTROINTESTINAL A:   Morbid obesity. Nutrition. P:   Continue tube feeds PPI for SUP   HEMATOLOGIC A:   Thrombocytopenia - chronic. VTE Prophylaxis. P:  Monitor platelet counts- Off heparin Check HIT panel 6/27 SCD's  CBC in AM.  INFECTIOUS A:   ? HCAP P:   Empiric abx as above Follow Pct  ENDOCRINE A:   No acute issues.   P:   Monitor glucose on BMP. SSI q 4   NEUROLOGIC A:   Acute hypercarbic encephalopathy. P:   RASS goal -2 On propofol gtt + fent gtt Consider precedex when more ready to wean  Family updated: Pt has a girlfriend [name] and she made her decision maker -updated 6/25.  No family at bedside 6/27 Interdisciplinary Family Meeting v Palliative Care Meeting:  Due by: 06/28.   Canary Brim, NP-C [facility] Pgr: [phone] or if no answer [phone] 08/23/2015, 9:28 AM   Attending note: I have seen and examined the patient with nurse practitioner/resident and agree with the note. History, labs and imaging reviewed.  Continue daily weaning trials Increase lasix to 40 IV bid. Continue antibiotics. Follow HIT panel.  Critical care time- 35 mins. This represents my time independent of the NPs time taking care of the patient.  Chilton Greathouse MD [facility] Pager [phone] If no answer or after 3pm call: [phone] 08/23/2015, 10:19 AM

## 2015-08-23 NOTE — Progress Notes (Signed)
Nutrition Follow-up  DOCUMENTATION CODES:   Morbid obesity  INTERVENTION:  - Continue Vital High Protein @ 15 mL/hr, will increase Prostat to 7 packets/day (60 mL Prostat TID + 30 mL Prostat once/day). This regimen + kcal from Propofol will provide 1430 kcal, 137 grams of protein (96% estimated need), and 301 mL free water. - Continue PEPup protocol.  - Free water flush per MD/NP order given hx of CHF, strict I&Os, need for Lasix QID.  - RD will follow-up 6/28.  NUTRITION DIAGNOSIS:   Inadequate oral intake related to inability to eat as evidenced by NPO status. -ongoing  GOAL:   Provide needs based on ASPEN/SCCM guidelines -meeting with current TF regimen.   MONITOR:   Vent status, TF tolerance, Weight trends, Labs, Skin, I & O's  REASON FOR ASSESSMENT:   Ventilator, Consult Enteral/tube feeding initiation and management  ASSESSMENT:   45 y.o. female with medical history significant of recent diagnosis gradfe 1 diastolic heart failure on recent hospitalization. Discharged from [facility] on May 29 and generally feeling well until about 4 days ago. She began feeling sluggish like the previous presentation. Symptoms worse when outside and in the humidity - significant difficulty catching her breath. Would feel nauseated. Symptoms ongoing the last 4 days. Today, she woke up and turned off the [facility] in the house (had also turned off her fan in her room last night). Went from bed to bathroom and was completely exhausted. With these symptoms, she decided to come back in. No chest pain/pressure other than following late-night eating a few days ago which led to indigestion. Does experience cough when she gets very hot or feels congested. +LE edema. Has not obtained a scale for daily weights - she was told to take Lasix if her weight increased more than 2 pounds/day. She did take the Lasix a couple of times and she did notice improvement, but she didn't take more (last took last night)  because she was noticing some cramping of legs/fingers/abdomen. Also has been trying to fluid restrict and so concerned that she wasn't taking in enough fluid. Does note ongoing nausea following meals, and this is new for her.  6/27 Pt continues with OGT in place. Spoke with RN in the room who reports that sedation and vent wean attempted this AM but was unsuccessful with pt desaturating at that time. She states pt restarted on sedation and pt now on full support on vent.   Pt currently receiving Vital High Protein @ 40 mL/hr with 60 mL Prostat TID which is providing 1560 kcal, 174 grams of protein, and 803 mL free water. Order placed yesterday for Vital High Protein @ 15 mL/hr with 60 mL Prostat TID which provides 960 kcal, 122 grams of protein, and 301 mL free water. RN preparing to change TF rate to ordered rate when RD entered the room.   Patient is currently intubated on ventilator support MV: 11.9 L/min Temp (24hrs), Avg:99.5 F (37.5 C), Min:98.9 F (37.2 C), Max:100.1 F (37.8 C) Propofol: 14 ml/hr (370 kcal).  Weight down 5 lbs since 08/21/15 but remains up 12 lbs since admission. RD ordered free water flush of 30 mL every 4 hours on 6/23; this order was d/c'ed and new order placed by MD on 6/24.   TF regimen as outlined above and RD will follow-up tomorrow. Medications reviewed; 40 mg IV Lasix QID per NP note this AM, sliding scale Novolog, 40 mg Protonix/day, 20 mEq KCl x1 dose today. Labs reviewed; CBGs: 99 and  101 mg/dL today, BUN/creatinine elevated and trending up, Ca: 8.1 mg/dL, GFR: 60 mL/min.  Drips: Propofol @ 15 mcg/kg/min, Fentanyl @ 100 mcg/hr.     6/26 - OGT remains in place with TF running per goal rate order: Vital High Protein @ 40 mL/hr + 60 mL Prostat BID and 200 mL free water every 6 hours which is providing 1360 kcal, 144 grams of protein, and 1203 mL free water.  - Weight down 3 lbs since assessment 6/23; will continue to monitor weight trends.  - Patient is  currently intubated on ventilator support; MV: 11.7 L/min; Temp (24hrs), Max:99.9 F (37.7 C); Propofol: 18.7 ml/hr (494 kcal)  - Per Dr. Shirlee More note today at 0930: pt off pressors and plan for wakeup and breath assessments.   Drip: Propofol @ 20 mcg/kg/min.   6/23 - Per Dr. Cristopher Estimable note 6/22 at 1255: Upon discharge, patient should discuss weight loss and lifestyle modifications with her primary care physician.  - She was initially placed on BiPAP for trial due ABG last night and was subsequently intubated this AM.  - Per Dr. Evlyn Courier note today at 0805: May need trach, and wonder about nocturnal ventilation long term. - Family in and out of room at time of RD visit. - Unable to discuss pt from PTA with them due to their conversation with RN and needing to leave. - Patient is currently intubated on ventilator support; MV: 9.8-11.6 L/min during time of visit; Temp (24hrs), Max:98.5 F (36.9 C); Propofol: none - Physical assessment shows no muscle or fat wasting to upper or lower body. Mild edema noted and notes indicate +1 edema on admission.  - Per chart review, weight up 16 lbs since admission which is likely fluid related given information outlined in H&P.  - K: 6.2 mmol/L, Phos: 6.3 mg/dL.  Drips: Fentanyl @ 25 mcg/hr, Versed @ 2.5 mg/hr, Neo @ 60 mcg/min (at time of visit at 1040).   Diet Order:  Diet NPO time specified  Skin:  Reviewed, no issues  Last BM:  6/23  Height:   Ht Readings from Last 1 Encounters:  08/19/15 5\' 5"  (1.651 m)    Weight:   Wt Readings from Last 1 Encounters:  08/23/15 335 lb 1.6 oz (152 kg)    Ideal Body Weight:  56.82 kg (kg)  BMI:  Body mass index is 55.76 kg/(m^2).  Estimated Nutritional Needs:   Kcal:  1250-1420 (22-25 kcal/kg IBW)  Protein:  142 grams (2.5 grams/kg IBW)  Fluid:  >/= 1.5 L/day  EDUCATION NEEDS:   No education needs identified at this time     Trenton Gammon, MS, RD, LDN Inpatient Clinical  Dietitian Pager # [phone] After hours/weekend pager # [phone]

## 2015-08-24 ENCOUNTER — Inpatient Hospital Stay (HOSPITAL_COMMUNITY): Payer: Self-pay

## 2015-08-24 ENCOUNTER — Inpatient Hospital Stay (HOSPITAL_COMMUNITY): Payer: MEDICAID

## 2015-08-24 DIAGNOSIS — R9431 Abnormal electrocardiogram [ECG] [EKG]: Secondary | ICD-10-CM

## 2015-08-24 LAB — GLUCOSE, CAPILLARY
GLUCOSE-CAPILLARY: 81 mg/dL (ref 65–99)
GLUCOSE-CAPILLARY: 96 mg/dL (ref 65–99)
GLUCOSE-CAPILLARY: 114 mg/dL — AB (ref 65–99)
GLUCOSE-CAPILLARY: 106 mg/dL — AB (ref 65–99)
Glucose-Capillary: 106 mg/dL — ABNORMAL HIGH (ref 65–99)
Glucose-Capillary: 93 mg/dL (ref 65–99)
Glucose-Capillary: 99 mg/dL (ref 65–99)
Glucose-Capillary: 90 mg/dL (ref 65–99)

## 2015-08-24 LAB — BASIC METABOLIC PANEL
ANION GAP: 6 (ref 5–15)
BUN: 40 mg/dL — ABNORMAL HIGH (ref 6–20)
CALCIUM: 8.4 mg/dL — AB (ref 8.9–10.3)
CHLORIDE: 103 mmol/L (ref 101–111)
CO2: 31 mmol/L (ref 22–32)
Creatinine, Ser: 1.23 mg/dL — ABNORMAL HIGH (ref 0.44–1.00)
GFR calc Af Amer: >60 mL/min (ref >60)
GFR calc non Af Amer: 52 mL/min — ABNORMAL LOW (ref >60)
GLUCOSE: 97 mg/dL (ref 65–99)
Potassium: 3.7 mmol/L (ref 3.5–5.1)
Sodium: 140 mmol/L (ref 135–145)

## 2015-08-24 LAB — CULTURE, RESPIRATORY

## 2015-08-24 LAB — BLOOD GAS, ARTERIAL
ACID-BASE EXCESS: 5.6 mmol/L — AB (ref 0.0–2.0)
Bicarbonate: 29.5 mEq/L — ABNORMAL HIGH (ref 20.0–24.0)
DRAWN BY: 422461
FIO2: 0.4
MECHVT: 460 mL
O2 SAT: 95.1 %
PATIENT TEMPERATURE: 99
PCO2 ART: 42.3 mmHg (ref 35.0–45.0)
PEEP/CPAP: 6 cmH2O
PH ART: 7.459 — AB (ref 7.350–7.450)
PO2 ART: 79.7 mmHg — AB (ref 80.0–100.0)
RATE: 24 resp/min
TCO2: 26.9 mmol/L (ref 0–100)

## 2015-08-24 LAB — CBC
HCT: 31.7 % — ABNORMAL LOW (ref 36.0–46.0)
HEMOGLOBIN: 9.9 g/dL — AB (ref 12.0–15.0)
MCH: 30.1 pg (ref 26.0–34.0)
MCHC: 31.2 g/dL (ref 30.0–36.0)
MCV: 96.4 fL (ref 78.0–100.0)
Platelets: 63 10*3/uL — ABNORMAL LOW (ref 150–400)
RBC: 3.29 MIL/uL — AB (ref 3.87–5.11)
RDW: 15.3 % (ref 11.5–15.5)
WBC: 7.4 10*3/uL (ref 4.0–10.5)

## 2015-08-24 LAB — ECHOCARDIOGRAM COMPLETE
EWDT: 222 ms
FS: 18 % — AB (ref 28–44)
HEIGHTINCHES: 65 in
IV/PV OW: 1
LA diam end sys: 38 mm
LADIAMINDEX: 1.55 cm/m2
LASIZE: 38 mm
LDCA: 3.46 cm2
LV PW d: 12.8 mm — AB (ref 0.6–1.1)
LVOTD: 21 mm
MV Dec: 222
MVPG: 4 mmHg
MVPKAVEL: 82.5 m/s
MVPKEVEL: 102 m/s
WEIGHTICAEL: 5280.46 [oz_av]

## 2015-08-24 LAB — TROPONIN I
TROPONIN I: 0.05 ng/mL — AB (ref <0.03)
Troponin I: 0.05 ng/mL (ref <0.03)

## 2015-08-24 LAB — MAGNESIUM: MAGNESIUM: 2.6 mg/dL — AB (ref 1.7–2.4)

## 2015-08-24 LAB — CULTURE, RESPIRATORY W GRAM STAIN

## 2015-08-24 LAB — HEPARIN INDUCED PLATELET AB (HIT ANTIBODY): Heparin Induced Plt Ab: 0.359 OD (ref 0.000–0.400)

## 2015-08-24 LAB — VANCOMYCIN, TROUGH: VANCOMYCIN TR: 24 ug/mL — AB (ref 15–20)

## 2015-08-24 MED ORDER — VITAL HIGH PROTEIN PO LIQD
1000.0000 mL | ORAL | Status: DC
  Administered 2015-08-24: 23:00:00
  Administered 2015-08-24: 1000 mL
  Filled 2015-08-24 (×2): qty 1000

## 2015-08-24 MED ORDER — VANCOMYCIN HCL IN DEXTROSE 750-5 MG/150ML-% IV SOLN
750.0000 mg | Freq: Two times a day (BID) | INTRAVENOUS | Status: DC
Start: 2015-08-24 — End: 2015-08-24
  Filled 2015-08-24: qty 150

## 2015-08-24 MED ORDER — PERFLUTREN LIPID MICROSPHERE
1.0000 mL | INTRAVENOUS | Status: AC | PRN
  Administered 2015-08-24: 4 mL via INTRAVENOUS
  Filled 2015-08-24: qty 10

## 2015-08-24 MED ORDER — CEFAZOLIN SODIUM-DEXTROSE 2-4 GM/100ML-% IV SOLN
2.0000 g | Freq: Three times a day (TID) | INTRAVENOUS | Status: AC
  Administered 2015-08-24 – 2015-08-28 (×12): 2 g via INTRAVENOUS
  Filled 2015-08-24 (×14): qty 100

## 2015-08-24 MED ORDER — POTASSIUM CHLORIDE 20 MEQ/15ML (10%) PO SOLN
40.0000 meq | Freq: Every day | ORAL | Status: DC
  Administered 2015-08-24 – 2015-08-25 (×2): 40 meq
  Filled 2015-08-24 (×2): qty 30

## 2015-08-24 MED ORDER — PRO-STAT SUGAR FREE PO LIQD
60.0000 mL | Freq: Two times a day (BID) | ORAL | Status: DC
  Administered 2015-08-24 – 2015-08-25 (×2): 60 mL via ORAL
  Filled 2015-08-24 (×2): qty 60

## 2015-08-24 MED ORDER — PERFLUTREN LIPID MICROSPHERE
INTRAVENOUS | Status: AC
  Filled 2015-08-24: qty 10

## 2015-08-24 MED ORDER — VITAL HIGH PROTEIN PO LIQD: 1000.0000 mL | ORAL | Status: DC

## 2015-08-24 NOTE — Progress Notes (Signed)
  Echocardiogram 2D Echocardiogram with Definity has been performed.  Jennifer Owens 08/24/2015, 3:22 PM

## 2015-08-24 NOTE — Progress Notes (Signed)
Nutrition Follow-up  DOCUMENTATION CODES:   Morbid obesity  INTERVENTION:  - Will change TF regimen now that Propofol d/c'ed: Vital High Protein @ 40 mL/hr with 60 mL Prostat BID. This regimen will provide 1360 kcal, 144 grams of protein, and 803 mL free water.  - Continue PEPuP protocol. - Continue free water flush per MD/NP order given need for Lasix, hx of CHF.   - RD will follow-up 6/29.  NUTRITION DIAGNOSIS:   Inadequate oral intake related to inability to eat as evidenced by NPO status. -ongoing  GOAL:   Provide needs based on ASPEN/SCCM guidelines -meeting with current regimen.  MONITOR:   Vent status, TF tolerance, Weight trends, Labs, Skin, I & O's  ASSESSMENT:   45 y.o. female with medical history significant of recent diagnosis gradfe 1 diastolic heart failure on recent hospitalization. Discharged from [facility] on May 29 and generally feeling well until about 4 days ago. She began feeling sluggish like the previous presentation. Symptoms worse when outside and in the humidity - significant difficulty catching her breath. Would feel nauseated. Symptoms ongoing the last 4 days. Today, she woke up and turned off the [facility] in the house (had also turned off her fan in her room last night). Went from bed to bathroom and was completely exhausted. With these symptoms, she decided to come back in. No chest pain/pressure other than following late-night eating a few days ago which led to indigestion. Does experience cough when she gets very hot or feels congested. +LE edema. Has not obtained a scale for daily weights - she was told to take Lasix if her weight increased more than 2 pounds/day. She did take the Lasix a couple of times and she did notice improvement, but she didn't take more (last took last night) because she was noticing some cramping of legs/fingers/abdomen. Also has been trying to fluid restrict and so concerned that she wasn't taking in enough fluid. Does note  ongoing nausea following meals, and this is new for her.  6/28 Pt continues with OGT and currently receiving Vital High Protein @ 15 mL/hr with 7 packets of Prostat/day (60 mL TID + 30 mL once/day). This regimen is providing 1060 kcal, 137 grams of protein, and 301 mL free water. Weight today down 5 lbs since yesterday (330 lbs versus 335 lbs). RN reports NP concerned about Vital High Protein previously at 40 mL/hr and now at 15 mL/hr. Change in order was due to Propofol kcal previous and Prostat was ordered with decrease in TF rate to ensure protein needs remained met.   Patient is currently intubated on ventilator support MV: 11.4 L/min Temp (24hrs), Avg:99.5 F (37.5 C), Min:99 F (37.2 C), Max:99.8 F (37.7 C) Propofol: off this AM and RN reports no plan to re-start at this time.   RN reports attempted vent wean this AM with pt desaturating similarly to yesterday. She reports plan to attempt wean again this afternoon. Per rounds yesterday AM, possible trach in the future if vent weaning unsuccessful; will monitor medical course.   Now that Propofol d/c'ed, will adjust TF regimen as outlined above and follow-up 6/29. Medications reviewed; 40 mg IV Lasix BID, sliding scale Novolog, 40 mg Protonix/day, 40 mEq KCl/day. Labs reviewed; CBGs: 81-99 mg/dL this AM, BUN/creatinine elevated, Ca: 8.4 mg/dL, Mg: 2.6 mg/dL.  Drip: Fentanyl @ 100 mcg/hr.    6/27 - Pt continues with OGT in place.  - Spoke with RN in the room who reports that sedation and vent wean attempted  this AM but was unsuccessful with pt desaturating at that time; pt restarted on sedation and pt now on full support on vent.  - Pt currently receiving Vital High Protein @ 40 mL/hr with 60 mL Prostat TID which is providing 1560 kcal, 174 grams of protein, and 803 mL free water.  - Order placed yesterday for Vital High Protein @ 15 mL/hr with 60 mL Prostat TID which provides 960 kcal, 122 grams of protein, and 301 mL free water.  -  Patient is currently intubated on ventilator support; MV: 11.9 L/min; Temp (24hrs),  Max:100.1 F (37.8 C); Propofol: 14 ml/hr (370 kcal). - Weight down 5 lbs since 08/21/15 but remains up 12 lbs since admission.  - RD ordered free water flush of 30 mL every 4 hours on 6/23; this order was d/c'ed and new order placed by MD on 6/24.  - Continue Vital High Protein @ 15 mL/hr and will increase Prostat to 7 packets/day (60 mL TID + 30 mL once/day)). This regimen + kcal from Propofol provides 1430 kcal, 137 grams of protein (96% estimated protein need), and 301 mL free water.   Drips: Propofol @ 15 mcg/kg/min, Fentanyl @ 100 mcg/hr.    6/26 - OGT remains in place with TF running per goal rate order: Vital High Protein @ 40 mL/hr + 60 mL Prostat BID and 200 mL free water every 6 hours which is providing 1360 kcal, 144 grams of protein, and 1203 mL free water.  - Weight down 3 lbs since assessment 6/23; will continue to monitor weight trends.  - Patient is currently intubated on ventilator support; MV: 11.7 L/min; Temp (24hrs), Max:99.9 F (37.7 C); Propofol: 18.7 ml/hr (494 kcal)  - Per Dr. Matilde Bash note today at 0930: pt off pressors and plan for wakeup and breath assessments.   Drip: Propofol @ 20 mcg/kg/min.    Diet Order:  Diet NPO time specified  Skin:  Reviewed, no issues  Last BM:  6/28  Height:   Ht Readings from Last 1 Encounters:  08/19/15 _0  (1.651 m)    Weight:   Wt Readings from Last 1 Encounters:  08/24/15 330 lb 0.5 oz (149.7 kg)    Ideal Body Weight:  56.82 kg (kg)  BMI:  Body mass index is 54.92 kg/(m^2).  Estimated Nutritional Needs:   Kcal:  1250-1420 (22-25 kcal/kg IBW)  Protein:  142 grams (2.5 grams/kg IBW)  Fluid:  >/= 1.5 L/day  EDUCATION NEEDS:   No education needs identified at this time     Jarome Matin, MS, RD, LDN Inpatient Clinical Dietitian Pager # [phone] After hours/weekend pager # [phone]

## 2015-08-24 NOTE — Progress Notes (Addendum)
Pharmacy Antibiotic Note  Jennifer Owens is a 45 y.o. female admitted on 08/17/2015 with respiratory failure.  Pharmacy was consulted to dose Zosyn for HAP and then consulted to dose vancomycin since tracheal aspirate culture (+) GPC pairs and clusters.  6/25 Trach aspirate culture  = abundant Staph aureus, sensitivities pending  Plan: - Continue Vancomycin 1250mg  IV q12h. Obtain vancomycin trough today (goal 15-20 mcg/ml). - No dose adjustment to Zosyn 3.375g IV q8h (4 hour infusion time). - Since trach aspirate culture growing Staph aureus only, would recommend narrowing therapy from Vanc/Zosyn to Vanc/Cefazolin until sensitivities result.  ADDENDUM: 08/24/2015 1:53 PM Vancomycin trough = 24 mcg/ml on 1250 mg q12h dosing  Plan: Adjust vancomycin to 750 mg IV q12h. Zosyn changed to Ancef per pharmacy per verbal order from CCM.  Start Ancef 2g IV q8h.   F/u sensitivities on respiratory culture and narrow.  Height: 5\' 5"  (165.1 cm) Weight: (!) 330 lb 0.5 oz (149.7 kg) IBW/kg (Calculated) : 57  Temp (24hrs), Avg:99.5 F (37.5 C), Min:99 F (37.2 C), Max:99.8 F (37.7 C)   Recent Labs Lab 08/18/15 1943 08/18/15 2207  08/20/15 0440 08/21/15 0412 08/22/15 0400 08/23/15 0340 08/23/15 2125 08/24/15 0330  WBC  --   --   < > 10.2 8.2 8.9 8.5  --  7.4  CREATININE  --   --   < > 1.86* 1.28* 1.14* 1.24* 1.25* 1.23*  LATICACIDVEN 1.1 0.7  --   --   --   --   --   --   --   < > = values in this interval not displayed.  Estimated Creatinine Clearance: 85.8 mL/min (by C-G formula based on Cr of 1.23).    No Known Allergies  Antimicrobials this admission: 6/25 Zosyn >>  6/25 Vancomycin >>  Dose adjustments this admission: 6/28 1130 VT: ___   Microbiology results: 6/22 BCx: ngtd 6/22 MRSA PCR: neg 6/23 Strep pneumo/leg ur ag: neg/neg 6/25 Trach asp: Abundant Staph aureus  Thank you for allowing pharmacy to be a part of this patient's care.  Clance Boll, PharmD, BCPS Pager:  [phone] 08/24/2015 10:39 AM

## 2015-08-24 NOTE — Progress Notes (Signed)
PULMONARY / CRITICAL CARE MEDICINE   Name: Jennifer Owens MRN: [id number] DOB: 1970-05-03    ADMISSION DATE:  08/17/2015 CONSULTATION DATE:  08/18/15  REFERRING MD:  TRH  CHIEF COMPLAINT:  SOB  HISTORY OF PRESENT ILLNESS:  Jennifer Owens is a 45 y.o. female admitted on 6/21 w/ working dx of acute on chronic hypercarbic respiratory failure in the setting of decompensated diastolic heart failure, pulmonary edema, and Cor Pulmonale. Placed on NIPPV and treated w/ IV diuresis/ no sig improvement. PCCM assumed care as of 6/23.   SUBJECTIVE:   RN reports runs of VT overnight, mag replaced, troponin mildly elevated, ECHO pending.  RT reports pt did not tolerate PSV weaning > dropped sats, periods of bradycardia  VITAL SIGNS: BP 96/52 mmHg  Pulse 62  Temp(Src) 99.6 F (37.6 C) (Oral)  Resp 24  Ht  (1.651 m)  Wt 330 lb 0.5 oz (149.7 kg)  BMI 54.92 kg/m2  SpO2 95%  LMP 05/21/2015 (Approximate)  HEMODYNAMICS: CVP:  [6 mmHg-20 mmHg] 15 mmHg  VENTILATOR SETTINGS: Vent Mode:  [-] PRVC FiO2 (%):  [40 %] 40 % Set Rate:  [24 bmp] 24 bmp Vt Set:  [460 mL] 460 mL PEEP:  [6 cmH20] 6 cmH20 Plateau Pressure:  [29 cmH20-32 cmH20] 29 cmH20  INTAKE / OUTPUT: I/O last 3 completed shifts: In: 3501 [I.V.:1212.7; NG/GT:1288.3; IV Piggyback:1000] Out: 4085 [Urine:4085]   PHYSICAL EXAMINATION: General: Morbidly obese female, no distress on vent Neuro:  Awakens to voice, follows commands. No focal deficits HEENT: PERRLA, ETT, unable to appreciate JVD due to neck habitus Cardiovascular: S1, S2, RRR, No MRG Lungs: Clear, no wheeze or crackles Abdomen: + BS, Non tender, non distended Musculoskeletal: trace edema Skin: warm and dry.   LABS:  BMET  Recent Labs Lab 08/23/15 0340 08/23/15 2125 08/24/15 0330  NA 141 141 140  K 3.8 3.9 3.7  CL 103 103 103  CO2 BUN 38* 40* 40*  CREATININE 1.24* 1.25* 1.23*  GLUCOSE 98 95 97    Electrolytes  Recent Labs Lab  08/20/15 0440 08/20/15 1810  08/23/15 0340 08/23/15 1035 08/23/15 2125 08/24/15 0330  CALCIUM 8.3*  --   < > 8.1*  --  8.3* 8.4*  MG 2.0 2.0  --   --  1.5*  --  2.6*  PHOS 2.7 2.3*  --   --  3.8  --   --   < > = values in this interval not displayed.  CBC  Recent Labs Lab 08/22/15 0400 08/23/15 0340 08/24/15 0330  WBC 8.9 8.5 7.4  HGB 10.8* 10.1* 9.9*  HCT 34.8* 32.2* 31.7*  PLT 48* 45* 63*    Coag's No results for input(s): APTT, INR in the last 168 hours.  Sepsis Markers  Recent Labs Lab 08/18/15 1942 08/18/15 1943 08/18/15 2207 08/19/15 0233 08/20/15 0440  LATICACIDVEN  --  1.1 0.7  --   --   PROCALCITON <0.10  --   --  0.66 0.73    ABG  Recent Labs Lab 08/19/15 1130 08/20/15 0347 08/24/15 0500  PHART 7.337* 7.438 7.459*  PCO2ART 53.8* 40.5 42.3  PO2ART 138* 61.3* 79.7*    Liver Enzymes No results for input(s): AST, ALT, ALKPHOS, BILITOT, ALBUMIN in the last 168 hours.  Cardiac Enzymes  Recent Labs Lab 08/19/15 1420 08/23/15 2125 08/24/15 0330  TROPONINI 0.12* 0.06* 0.05*    Glucose  Recent Labs Lab 08/23/15 1639 08/23/15 2048 08/23/15 2340 08/24/15 0338 08/24/15 0350  08/24/15 0725  GLUCAP 119* 99 114* 99 93 81    Imaging Dg Chest Port 1 View  08/24/2015  CLINICAL DATA:  Acute respiratory failure EXAM: PORTABLE CHEST 1 VIEW COMPARISON:  08/22/2015 FINDINGS: Endotracheal tube remains in good position. NG tube enters the stomach. Left jugular central venous catheter tip in the lower SVC unchanged. No pneumothorax Improving diffuse bilateral airspace disease compatible with edema. Marked cardiac enlargement remains. No effusion. Mild bibasilar atelectasis has improved. IMPRESSION: Improved aeration of the lungs with decrease in pulmonary edema. Improvement in bibasilar atelectasis. Electronically Signed   By: Marlan Palau M.D.   On: 08/24/2015 07:10     STUDIES:  CXR 6/26 >> Reviewed. CHF persists ECHO 6/28 >>    CULTURES: MRSA 6/22 >> negative Blood culture 6/22 > negative Trach culture 6/25 >> abundant staph >>   ANTIBIOTICS: Vanco 6/25 >> Zosyn 6/25 >>  SIGNIFICANT EVENTS: 6/21  Admit. 6/22  Transferred to ICU and started on BiPAP/NIV for respiratory acidosis / hypoxemic and hypercarbic respiratory failure. 6/23  Intubated 6/28  Runs of VT overnight, mild elevation of troponin, ECHO pending  LINES/TUBES: ETT 6/23 >> LIJ 6/23 >>  DISCUSSION: 44 Y/O with CHF, +/- PNA  ASSESSMENT / PLAN:  PULMONARY A: Acute on chronic hypoxemic and hypercarbic respiratory failure 2/2 OHS, Likely OSA, Diastolic Dysfxn -failed NIV H/O asthma in mild exacerbation Tobacco use disorder. P:   PRVC 8 cc/kg  Wean PEEP/FiO2 for sats >92% Pulmicort neb bid and duoneb q4. Daily WUA / SBT  Trend CXR  Pulmicort BID   CARDIOVASCULAR A:  dCHF with exacerbation - on last discharge May 2017 weight was 321lbs, now up to 327lbs-->EF preserved by echo on 5/26 Pulmonary Edema  Hx HTN; but HYPOTENSIVE 6/23-->? R/t acidosis  P:  Cardiac monitoring  Lasix 40 mg BID as sr cr / BP tolerate.  May need to d/c am 6/29 pending lab review Await ECHO  Trend troponin   RENAL A:   AKI--> Improved Hyperkalemia-->got Kayexalate 6/23 Hypomagnesemia  P:   Continue lasix as above Strict I&O Replace electrolytes as indicted Trend BMP  GASTROINTESTINAL A:   Morbid obesity. Nutrition. P:   Continue tube feeds per Nutrition >> will need to adjust for d/c of propofol PPI for SUP   HEMATOLOGIC A:   Thrombocytopenia - chronic. VTE Prophylaxis. P:  Monitor platelet counts- Off heparin Check HIT panel 6/27 SCD's  CBC in AM.  INFECTIOUS A:   ? HCAP P:   Empiric abx as above Follow PCT  ENDOCRINE A:   No acute issues.   P:   Monitor glucose on BMP. SSI q 4   NEUROLOGIC A:   Acute hypercarbic encephalopathy. P:   RASS goal 0 to -1 Fent gtt for pain  D/C precedex / propofol gtt's   Family  updated: Pt has a girlfriend Jennifer Owens and she made her decision maker -updated 6/25.  No family at bedside 6/28.  Patient updated on plan of care  Interdisciplinary Family Meeting v Palliative Care Meeting:  Due by: 06/28.   Canary Brim, NP-C [facility] Pgr: [phone] or if no answer [phone] 08/24/2015, 9:55 AM    Attending note: I have seen and examined the patient with nurse practitioner/resident and agree with the note. History, labs and imaging reviewed.  30 beat run of VT noted yesterday. Mg repleted Troponins are mildly elevated, stable.   Continue daily weaning trials Lasix 40 IV bid. Continue antibiotics. Follow echo  Critical  care time- 35 mins. This represents my time independent of the NPs time taking care of the patient.  Chilton Greathouse MD  Pulmonary and Critical Care Pager [phone] If no answer or after 3pm call: [phone] 08/24/2015, 12:28 PM

## 2015-08-25 DIAGNOSIS — I110 Hypertensive heart disease with heart failure: Principal | ICD-10-CM

## 2015-08-25 DIAGNOSIS — I5041 Acute combined systolic (congestive) and diastolic (congestive) heart failure: Secondary | ICD-10-CM

## 2015-08-25 LAB — CBC
HEMATOCRIT: 31.6 % — AB (ref 36.0–46.0)
HEMOGLOBIN: 9.9 g/dL — AB (ref 12.0–15.0)
MCH: 30.7 pg (ref 26.0–34.0)
MCHC: 31.3 g/dL (ref 30.0–36.0)
MCV: 98.1 fL (ref 78.0–100.0)
Platelets: 82 10*3/uL — ABNORMAL LOW (ref 150–400)
RBC: 3.22 MIL/uL — ABNORMAL LOW (ref 3.87–5.11)
RDW: 14.9 % (ref 11.5–15.5)
WBC: 7.1 10*3/uL (ref 4.0–10.5)

## 2015-08-25 LAB — GLUCOSE, CAPILLARY
GLUCOSE-CAPILLARY: 103 mg/dL — AB (ref 65–99)
GLUCOSE-CAPILLARY: 128 mg/dL — AB (ref 65–99)
GLUCOSE-CAPILLARY: 108 mg/dL — AB (ref 65–99)
GLUCOSE-CAPILLARY: 83 mg/dL (ref 65–99)
Glucose-Capillary: 94 mg/dL (ref 65–99)

## 2015-08-25 LAB — BASIC METABOLIC PANEL
ANION GAP: 8 (ref 5–15)
BUN: 37 mg/dL — AB (ref 6–20)
CHLORIDE: 106 mmol/L (ref 101–111)
CO2: 29 mmol/L (ref 22–32)
Calcium: 8.8 mg/dL — ABNORMAL LOW (ref 8.9–10.3)
Creatinine, Ser: 1.17 mg/dL — ABNORMAL HIGH (ref 0.44–1.00)
GFR calc Af Amer: >60 mL/min (ref >60)
GFR, EST NON AFRICAN AMERICAN: 55 mL/min — AB (ref >60)
Glucose, Bld: 102 mg/dL — ABNORMAL HIGH (ref 65–99)
POTASSIUM: 3.4 mmol/L — AB (ref 3.5–5.1)
SODIUM: 143 mmol/L (ref 135–145)

## 2015-08-25 MED ORDER — POTASSIUM CHLORIDE CRYS ER 20 MEQ PO TBCR
40.0000 meq | EXTENDED_RELEASE_TABLET | Freq: Two times a day (BID) | ORAL | Status: DC
  Administered 2015-08-26 – 2015-08-31 (×10): 40 meq via ORAL
  Filled 2015-08-25 (×12): qty 2

## 2015-08-25 NOTE — Consult Note (Signed)
Patient ID: Jennifer Owens MRN: [id number] DOB/AGE: 11-08-1970 45 y.o.  Admit date: 08/17/2015 Primary Physician PROVIDER NOT [place] Primary Cardiologist Jennifer Owens (new) Requesting Physician: Jennifer Garfinkel, MD   Chief Complaint  New onset heart failure  HPI: Jennifer Owens is a 45F with hypertension, asthma, morbid obesity, and chronic diastolic dysfunction who presented tot he ED on 6/21 with shortness of breath.  Prior to that she had been hospitalized 5/26-5/29 with shortness of breath and diastolic heart failure.  BNP was around 300.  She was diuresed with IV lasix and received steroids.  Her blood pressure was also poorly controlled prior to that admission.  She was noted to asymptomatic episodes of NSVT.  An echo was obtained that revealed LVEF 55-60% with grade 1 diastolic dysfunction and a trivial pericardial effusion.  Jennifer Owens was admitted 6/21 with shortness of breath.  She ran out of medications and had not obtained a scale to weigh herself prior to readmission.  Her admission weight was 148.3 kg, up from 145.9kg at discharge.  BNP was 589 and room air oxygen saturations were in the upper 80s. Diuresis was started and she was started on BiPAP on 6/22 for hypoxic and hypercarbic respiratory failure.  She was subsequently intubated 6/23 and extubated today.  She is feeling much better and states that her breathing has improved.  She endorses orthopnea.  Echo this admission revealed LVEF 35-40% with diffuse hypokinesis and moderately dilated LV.  The RV was also dilated and systolic function was reduced.  Overnight on 6/28 Jennifer Owens had several runs of NSVT.  She did not note any palpitations or increased shortness of breath.   Cardiology was consulted for management of acute systolic and diastolic heart failure and VT.    Review of Systems: A 12 point review of systems was obtained and was negative with exceptions noted in the HPI.     Past Medical History  Diagnosis Date  .  Sciatica   . Asthma   . Diastolic dysfunction with heart failure ([place])   . Morbid obesity ([place])   . Thrombocytopenia ([place])   . Malignant hypertension   . Tobacco abuse     Medications Prior to Admission  Medication Sig Dispense Refill  . acetaminophen (TYLENOL) 500 MG tablet Take 1,000 mg by mouth every 6 (six) hours as needed for moderate pain or headache.    . albuterol (PROVENTIL HFA;VENTOLIN HFA) 108 (90 Base) MCG/ACT inhaler Inhale 2 puffs into the lungs every 6 (six) hours as needed for wheezing or shortness of breath. 1 Inhaler 0  . furosemide (LASIX) 20 MG tablet Take 1 tablet (20 mg total) by mouth daily as needed. Take one tablet if you gain more than 2 pounds in 24 hours. 30 tablet 0  . hydrALAZINE (APRESOLINE) 25 MG tablet Take 1 tablet (25 mg total) by mouth every 8 (eight) hours. 90 tablet 0  . metoprolol tartrate (LOPRESSOR) 25 MG tablet Take 1 tablet (25 mg total) by mouth 2 (two) times daily. 30 tablet 0  . [DISCONTINUED] predniSONE (DELTASONE) 20 MG tablet Take 2 tablets (40 mg total) by mouth daily with breakfast. (Patient not taking: Reported on 08/17/2015) 6 tablet 0     . antiseptic oral rinse  7 mL Mouth Rinse BID  . antiseptic oral rinse  7 mL Mouth Rinse QID  . budesonide (PULMICORT) nebulizer solution  0.5 mg Nebulization BID  .  ceFAZolin (ANCEF) IV  2 g Intravenous Q8H  . chlorhexidine gluconate (  SAGE KIT)  15 mL Mouth Rinse BID  . fentaNYL (SUBLIMAZE) injection  50 mcg Intravenous Once  . furosemide  40 mg Intravenous BID  . insulin aspart  0-20 Units Subcutaneous Q4H  . ipratropium-albuterol  3 mL Nebulization Q6H  . pantoprazole sodium  40 mg Per Tube Daily  . potassium chloride  40 mEq Per Tube Daily  . sodium chloride  1,000 mL Intravenous Once    Infusions: . fentaNYL infusion INTRAVENOUS Stopped (08/25/15 7494)    No Known Allergies  Social History   Social History  . Marital Status: Legally Separated    Spouse Name: N/A  . Number of  Children: N/A  . Years of Education: N/A   Occupational History  . CNA for [employer] History Main Topics  . Smoking status: Current Every Day Smoker -- 0.25 packs/day for 10 years    Types: Cigarettes  . Smokeless tobacco: Never Used  . Alcohol Use: 0.6 oz/week    1 Standard drinks or equivalent per week     Comment: very rarely - 1-2 drinks/month  . Drug Use: No  . Sexual Activity: Not on file   Other Topics Concern  . Not on file   Social History Narrative    Family History  Problem Relation Age of Onset  . Diabetes Mellitus II Mother   . Hypertension Mother   . Diabetes Mellitus II Father     PHYSICAL EXAM: Filed Vitals:   08/25/15 1200 08/25/15 1300  BP: 132/67 120/74  Pulse: 101 109  Temp: 98.7 F (37.1 C)   Resp: 28 33     Intake/Output Summary (Last 24 hours) at 08/25/15 1330 Last data filed at 08/25/15 1300  Gross per 24 hour  Intake 2111.83 ml  Output   4110 ml  Net -1998.17 ml    General:  Well appearing. No respiratory difficulty HEENT: normal.  Subconjunctival hemorrhage.  Voice hoarse Neck: supple. JVP 2 cm above the clavicle at 45 degrees.  Carotids 2+ bilat; no bruits. No lymphadenopathy or thryomegaly appreciated. Cor: PMI nondisplaced. Mostly regular with occasional ectopy. No rubs, gallops or murmurs. Lungs: clear Abdomen: soft, nontender, nondistended. No hepatosplenomegaly. No bruits or masses. Good bowel sounds. Extremities: no cyanosis, clubbing, rash, edema Neuro: alert & oriented x 3, cranial nerves grossly intact. moves all 4 extremities w/o difficulty. Affect pleasant.  Results for orders placed or performed during the hospital encounter of 08/17/15 (from the past 24 hour(s))  Glucose, capillary     Status: None   Collection Time: 08/24/15  4:03 PM  Result Value Ref Range   Glucose-Capillary 90 65 - 99 mg/dL   Comment 1 Notify RN    Comment 2 Document in Chart   Glucose, capillary     Status: Abnormal     Collection Time: 08/24/15  7:52 PM  Result Value Ref Range   Glucose-Capillary 114 (H) 65 - 99 mg/dL  Glucose, capillary     Status: Abnormal   Collection Time: 08/24/15 11:20 PM  Result Value Ref Range   Glucose-Capillary 106 (H) 65 - 99 mg/dL  Glucose, capillary     Status: None   Collection Time: 08/25/15  3:33 AM  Result Value Ref Range   Glucose-Capillary 94 65 - 99 mg/dL  Basic metabolic panel     Status: Abnormal   Collection Time: 08/25/15  3:42 AM  Result Value Ref Range   Sodium 143 135 - 145 mmol/L   Potassium 3.4 (  L) 3.5 - 5.1 mmol/L   Chloride 106 101 - 111 mmol/L   CO2 29 22 - 32 mmol/L   Glucose, Bld 102 (H) 65 - 99 mg/dL   BUN 37 (H) 6 - 20 mg/dL   Creatinine, Ser 7.98 (H) 0.44 - 1.00 mg/dL   Calcium 8.8 (L) 8.9 - 10.3 mg/dL   GFR calc non Af Amer 55 (L) >60 mL/min   GFR calc Af Amer >60 >60 mL/min   Anion gap 8 5 - 15  CBC     Status: Abnormal   Collection Time: 08/25/15  3:42 AM  Result Value Ref Range   WBC 7.1 4.0 - 10.5 K/uL   RBC 3.22 (L) 3.87 - 5.11 MIL/uL   Hemoglobin 9.9 (L) 12.0 - 15.0 g/dL   HCT 09.1 (L) 38.3 - 91.9 %   MCV 98.1 78.0 - 100.0 fL   MCH 30.7 26.0 - 34.0 pg   MCHC 31.3 30.0 - 36.0 g/dL   RDW 73.7 62.2 - 33.1 %   Platelets 82 (L) 150 - 400 K/uL  Glucose, capillary     Status: Abnormal   Collection Time: 08/25/15  7:17 AM  Result Value Ref Range   Glucose-Capillary 103 (H) 65 - 99 mg/dL   Comment 1 Notify RN    Comment 2 Document in Chart   Glucose, capillary     Status: Abnormal   Collection Time: 08/25/15 12:06 PM  Result Value Ref Range   Glucose-Capillary 128 (H) 65 - 99 mg/dL   Dg Chest Port 1 View  08/24/2015  CLINICAL DATA:  Acute respiratory failure EXAM: PORTABLE CHEST 1 VIEW COMPARISON:  08/22/2015 FINDINGS: Endotracheal tube remains in good position. NG tube enters the stomach. Left jugular central venous catheter tip in the lower SVC unchanged. No pneumothorax Improving diffuse bilateral airspace disease compatible  with edema. Marked cardiac enlargement remains. No effusion. Mild bibasilar atelectasis has improved. IMPRESSION: Improved aeration of the lungs with decrease in pulmonary edema. Improvement in bibasilar atelectasis. Electronically Signed   By: Marlan Palau M.D.   On: 08/24/2015 07:10    Echo 08/24/15: Study Conclusions  - Left ventricle: The cavity size was moderately dilated. There was  mild concentric hypertrophy. LVEF 35-40%. Wall  motion was normal; there were no regional wall motion  abnormalities. The study is not technically sufficient to allow  evaluation of LV diastolic function. - Right ventricle: The cavity size was moderately dilated. Wall  thickness was normal. Systolic function was moderately reduced. - Pulmonic valve: Not visualized. - Pulmonary arteries: Systolic pressure couldn&'t be assessed. - Inferior vena cava: Not visualized. - Pericardium, extracardiac: There was no pericardial effusion.   ECG: Sinus rhythm rate 71 bpm.  PVCs. Inferior TWI.  QTc 427 ms.  Telemetry: Sinus rhythm with polymorphic PVCs.  3-4 beat runs of NSVT  ASSESSMENT/PLAN:  # Acute systolic and diastolic heart failure:  # Hypertensive heart disease:The etiology of her heart failure is not clear.  She denies recent viral illness.  There were no focal wall motion abnormalities and she is young, but ischemia needs to be ruled out.  Due to her body habitus, stress testing would be a challenge.  We will plan for LHC when more medically stable and euvolemic.  She still reports orthopnea.  Continue diuresis with lasix 40 mg IV BID.  Renal function is improving.  BP is low.  We will start metoprolol tartrate 12.5 mg bid.  Will consolidate prior to discharge.  Add ACE-I as BP  allows.   # NSVT:  QT is not prolonged.  Her potassium has been a little low.  We will increase her potassium supplementation to 40 mEq bid while on IV lasix.  We will also check a magnesium level.  Ischemia evaluation and  metoprolol as above.   Signed: Taje Littler C. Jennifer Linsey, MD, [facility]  08/25/2015, 1:30 PM

## 2015-08-25 NOTE — Progress Notes (Signed)
PULMONARY / CRITICAL CARE MEDICINE   Name: Jennifer Owens MRN: [id number] DOB: 18-Jan-1971    ADMISSION DATE:  08/17/2015 CONSULTATION DATE:  08/18/15  REFERRING MD:  TRH  CHIEF COMPLAINT:  SOB  HISTORY OF PRESENT ILLNESS:  Jennifer Owens is a 45 y.o. female admitted on 6/21 w/ working dx of acute on chronic hypercarbic respiratory failure in the setting of decompensated diastolic heart failure, pulmonary edema, and Cor Pulmonale. Placed on NIPPV and treated w/ IV diuresis/ no sig improvement. PCCM assumed care as of 6/23.   SUBJECTIVE:    No issues overnight.   VITAL SIGNS: BP 144/81 mmHg  Pulse 86  Temp(Src) 98.5 F (36.9 C) (Oral)  Resp 16  Ht 5\' 5"  (1.651 m)  Wt 326 lb 15.1 oz (148.3 kg)  BMI 54.41 kg/m2  SpO2 100%  LMP 05/21/2015 (Approximate)  HEMODYNAMICS: CVP:  [14 mmHg-20 mmHg] 15 mmHg  VENTILATOR SETTINGS: Vent Mode:  [-] PSV;CPAP FiO2 (%):  [40 %] 40 % Set Rate:  [24 bmp] 24 bmp Vt Set:  [460 mL] 460 mL PEEP:  [0 cmH20-6 cmH20] 0 cmH20 Pressure Support:  [0 cmH20-5 cmH20] 0 cmH20 Plateau Pressure:  [28 cmH20-32 cmH20] 32 cmH20  INTAKE / OUTPUT: I/O last 3 completed shifts: In: 3057.6 [I.V.:849.3; NG/GT:1708.3; IV Piggyback:500] Out: 5490 [Urine:5490]   PHYSICAL EXAMINATION: General: Morbidly obese female, no distress on weaning trial Neuro:  Awakens to voice, follows commands. No focal deficits HEENT: PERRLA, ETT,  Cardiovascular: S1, S2, RRR, No MRG Lungs: Clear, no wheeze or crackles Abdomen: + BS, Non tender, non distended Musculoskeletal: trace edema Skin: Intact  LABS:  BMET  Recent Labs Lab 08/23/15 2125 08/24/15 0330 08/25/15 0342  NA 141 140 143  K 3.9 3.7 3.4*  CL 103 103 106  CO2 30 31 29   BUN 40* 40* 37*  CREATININE 1.25* 1.23* 1.17*  GLUCOSE 95 97 102*    Electrolytes  Recent Labs Lab 08/20/15 0440 08/20/15 1810  08/23/15 1035 08/23/15 2125 08/24/15 0330 08/25/15 0342  CALCIUM 8.3*  --   < >  --  8.3* 8.4* 8.8*   MG 2.0 2.0  --  1.5*  --  2.6*  --   PHOS 2.7 2.3*  --  3.8  --   --   --   < > = values in this interval not displayed.  CBC  Recent Labs Lab 08/23/15 0340 08/24/15 0330 08/25/15 0342  WBC 8.5 7.4 7.1  HGB 10.1* 9.9* 9.9*  HCT 32.2* 31.7* 31.6*  PLT 45* 63* 82*    Coag's No results for input(s): APTT, INR in the last 168 hours.  Sepsis Markers  Recent Labs Lab 08/18/15 1942 08/18/15 1943 08/18/15 2207 08/19/15 0233 08/20/15 0440  LATICACIDVEN  --  1.1 0.7  --   --   PROCALCITON <0.10  --   --  0.66 0.73    ABG  Recent Labs Lab 08/19/15 1130 08/20/15 0347 08/24/15 0500  PHART 7.337* 7.438 7.459*  PCO2ART 53.8* 40.5 42.3  PO2ART 138* 61.3* 79.7*    Liver Enzymes No results for input(s): AST, ALT, ALKPHOS, BILITOT, ALBUMIN in the last 168 hours.  Cardiac Enzymes  Recent Labs Lab 08/23/15 2125 08/24/15 0330 08/24/15 0904  TROPONINI 0.06* 0.05* 0.05*    Glucose  Recent Labs Lab 08/24/15 1204 08/24/15 1603 08/24/15 1952 08/24/15 2320 08/25/15 0333 08/25/15 0717  GLUCAP 96 90 114* 106* 94 103*    Imaging No results found.   STUDIES:  CXR 6/29 >  Improved edema ECHO 6/28 >> LVEF 35-40%, dilated RV  CULTURES: MRSA 6/22 >> negative Blood culture 6/22 > negative Trach culture 6/25 >> abundant staph >>   ANTIBIOTICS: Vanco 6/25 >> Zosyn 6/25 >>  SIGNIFICANT EVENTS: 6/21  Admit. 6/22  Transferred to ICU and started on BiPAP/NIV for respiratory acidosis / hypoxemic and hypercarbic respiratory failure. 6/23  Intubated 6/28  Runs of VT overnight, mild elevation of troponin.  LINES/TUBES: ETT 6/23 >> LIJ 6/23 >>  DISCUSSION: 44 Y/O with CHF, +/- PNA  ASSESSMENT / PLAN:  PULMONARY A: Acute on chronic hypoxemic and hypercarbic respiratory failure 2/2 OHS, Likely OSA, Diastolic Dysfxn -failed NIV H/O asthma in mild exacerbation Tobacco use disorder. P:   PRVC 8 cc/kg  Wake up and wean trials Pulmicort neb bid and duoneb  q4.  CARDIOVASCULAR A:  H/O dCHF with exacerbation New noted to have new low EF VT on tele Pulmonary Edema  P:  Cardiac monitoring, replete lytes Lasix 40 mg bid Echo results noted. Will need a cardiology eval for low EF, VTs   RENAL A:   AKI--> Improved Hyperkalemia-->got Kayexalate 6/23 Hypomagnesemia  P:   Continue lasix as above Strict I&O Replace electrolytes as indicted Trend BMP  GASTROINTESTINAL A:   Morbid obesity. Nutrition. P:   Holding tube feeds in anticipation of extubation. PPI for SUP   HEMATOLOGIC A:   Thrombocytopenia - chronic. VTE Prophylaxis. P:  Monitor platelet counts- Off heparin Follow HIT panel, ordered 6/27 SCD's   INFECTIOUS A:   ? HCAP P:   Empiric abx as above Follow PCT  ENDOCRINE A:   No acute issues.   P:   Monitor glucose on BMP. SSI q 4   NEUROLOGIC A:   Acute hypercarbic encephalopathy. P:   RASS goal 0 to -1 Fent gtt for pain    Family updated: Pt has a girlfriend [name] and she made her decision maker -updated 6/25.  No family at bedside 6/28.  Patient updated on plan of care  Interdisciplinary Family Meeting v Palliative Care Meeting:  Due by: 06/28.   Chilton Greathouse MD [facility] Pager [phone] If no answer or after 3pm call: [phone] 08/25/2015, 10:39 AM

## 2015-08-25 NOTE — Progress Notes (Signed)
Nutrition Follow-up  DOCUMENTATION CODES:   Morbid obesity  INTERVENTION:  - Diet advancement as medically feasible.  - RD will follow-up 6/30.  NUTRITION DIAGNOSIS:   Inadequate oral intake related to inability to eat as evidenced by NPO status. -ongoing  GOAL:   Patient will meet greater than or equal to 90% of their needs -updated s/p extubation  MONITOR:   Diet advancement, Weight trends, Labs, I & O's  ASSESSMENT:   45 y.o. female with medical history significant of recent diagnosis gradfe 1 diastolic heart failure on recent hospitalization. Discharged from [facility] on May 29 and generally feeling well until about 4 days ago. She began feeling sluggish like the previous presentation. Symptoms worse when outside and in the humidity - significant difficulty catching her breath. Would feel nauseated. Symptoms ongoing the last 4 days. Today, she woke up and turned off the [facility] in the house (had also turned off her fan in her room last night). Went from bed to bathroom and was completely exhausted. With these symptoms, she decided to come back in. No chest pain/pressure other than following late-night eating a few days ago which led to indigestion. Does experience cough when she gets very hot or feels congested. +LE edema. Has not obtained a scale for daily weights - she was told to take Lasix if her weight increased more than 2 pounds/day. She did take the Lasix a couple of times and she did notice improvement, but she didn't take more (last took last night) because she was noticing some cramping of legs/fingers/abdomen. Also has been trying to fluid restrict and so concerned that she wasn't taking in enough fluid. Does note ongoing nausea following meals, and this is new for her.  Pt extubated shortly after RD visit this AM. Confirmed during rounds that pt is extubated, on [place], and OGT removed at time of extubation; will d/c TF and Prostat order. Will follow-up tomorrow to monitor  for diet advancement and associated needs. Estimated nutrition needs updated at this time. Per chart review, weight down 4 lbs since yesterday (326 lbs today versus 330 lbs yesterday).  Unable to meet needs currently. Medications reviewed; 40 mg Lasix BID, sliding scale Novolog, 40 mg Protonix/day, 40 mEq KCl/day. Labs reviewed; CBGs: 94 and 103 mg/dL this AM, K: 3.4 mmol/L, BUN/creatinine elevated, Ca: 8.8 mg/dL.  Diet Order:  Diet NPO time specified  Skin:  Reviewed, no issues  Last BM:  6/28  Height:   Ht Readings from Last 1 Encounters:  08/19/15 5\' 5"  (1.651 m)    Weight:   Wt Readings from Last 1 Encounters:  08/25/15 326 lb 15.1 oz (148.3 kg)    Ideal Body Weight:  56.82 kg (kg)  BMI:  Body mass index is 54.41 kg/(m^2).  Estimated Nutritional Needs:   Kcal:  2025-2225  Protein:  110-120 grams  Fluid:  1.8-2 L/day  EDUCATION NEEDS:   No education needs identified at this time     Trenton Gammon, MS, RD, LDN Inpatient Clinical Dietitian Pager # [phone] After hours/weekend pager # [phone]

## 2015-08-25 NOTE — Progress Notes (Signed)
Pharmacy Antibiotic Note  Jennifer Owens is a 45 y.o. female admitted on 08/17/2015 with respiratory failure.  Pharmacy was consulted to dose Zosyn for HAP and then consulted to dose vancomycin since tracheal aspirate culture (+) GPC pairs and clusters.  Trach aspirate culture grew abundant MSSA.  Antibiotics narrowed to cefazolin.  Plan: Continue cefazolin 2g IV q8h.  SCr elevated from diuresis but has been improving. CrCl remaining >50 ml/min so further dose adjustments appears unlikely at present.  Pharmacy will sign off at this time but will continue to monitor for antibiotic renal dose adjustments as needed while CCM is involved.  Height: 5\' 5"  (165.1 cm) Weight: (!) 326 lb 15.1 oz (148.3 kg) IBW/kg (Calculated) : 57  Temp (24hrs), Avg:99.2 F (37.3 C), Min:98.5 F (36.9 C), Max:100.2 F (37.9 C)   Recent Labs Lab 08/18/15 1943 08/18/15 2207  08/21/15 0412 08/22/15 0400 08/23/15 0340 08/23/15 2125 08/24/15 0330 08/24/15 1223 08/25/15 0342  WBC  --   --   < > 8.2 8.9 8.5  --  7.4  --  7.1  CREATININE  --   --   < > 1.28* 1.14* 1.24* 1.25* 1.23*  --  1.17*  LATICACIDVEN 1.1 0.7  --   --   --   --   --   --   --   --   VANCOTROUGH  --   --   --   --   --   --   --   --  24*  --   < > = values in this interval not displayed.  Estimated Creatinine Clearance: 89.6 mL/min (by C-G formula based on Cr of 1.17).    No Known Allergies  Antimicrobials this admission: 6/25 Zosyn >> 6/28 6/25 Vancomycin >> 6/28 6/28 Cefazolin >>  Microbiology results: 6/22 BCx: NGF 6/22 MRSA PCR: neg 6/23 Strep pneumo/leg ur ag: neg/neg 6/25 Trach asp: Abundant MSSA  Thank you for allowing pharmacy to be a part of this patient's care.  Clance Boll, PharmD, BCPS Pager: [phone] 08/25/2015 8:48 AM

## 2015-08-25 NOTE — Procedures (Signed)
Extubation Procedure Note  Patient Details:   Name: Jennifer Owens DOB: 1970-05-11 MRN: [id number]   Airway Documentation:     Evaluation  O2 sats: 95 Complications: none Patient tolerated procedure well. Bilateral Breath Sounds: dmn   Pt able to speak  Per CCM order, pt extubated and placed on nasal cannula.  Tolerated well, no complications.  Revonda Humphrey 08/25/2015, 11:09 AM

## 2015-08-25 NOTE — Progress Notes (Signed)
After 4 hours of extubation, patient requested ice chips. Patient handled them well with minimal coughing.Will attempt sips of clear liquids when patient feels ready.

## 2015-08-25 NOTE — Progress Notes (Addendum)
RN provided pt with ice chips and pt did well swallowing ice chips with no cough. RN gave pt small sips of water and pt did well, with few, non-productive cough. RN attempted to administer PO potassium and pt began coughing forceful, productive cough. RN did not continue to administer PO potassium, per pt cough and risk for aspiration. Will continue to monitor.

## 2015-08-26 ENCOUNTER — Inpatient Hospital Stay (HOSPITAL_COMMUNITY): Payer: Self-pay

## 2015-08-26 DIAGNOSIS — I493 Ventricular premature depolarization: Secondary | ICD-10-CM

## 2015-08-26 DIAGNOSIS — Z720 Tobacco use: Secondary | ICD-10-CM

## 2015-08-26 DIAGNOSIS — J9600 Acute respiratory failure, unspecified whether with hypoxia or hypercapnia: Secondary | ICD-10-CM

## 2015-08-26 LAB — CBC
HCT: 38.1 % (ref 36.0–46.0)
Hemoglobin: 11.2 g/dL — ABNORMAL LOW (ref 12.0–15.0)
MCH: 30.2 pg (ref 26.0–34.0)
MCHC: 29.4 g/dL — AB (ref 30.0–36.0)
MCV: 102.7 fL — AB (ref 78.0–100.0)
PLATELETS: 106 10*3/uL — AB (ref 150–400)
RBC: 3.71 MIL/uL — ABNORMAL LOW (ref 3.87–5.11)
RDW: 14.3 % (ref 11.5–15.5)
WBC: 7.1 10*3/uL (ref 4.0–10.5)

## 2015-08-26 LAB — MAGNESIUM: Magnesium: 2.2 mg/dL (ref 1.7–2.4)

## 2015-08-26 LAB — GLUCOSE, CAPILLARY
Glucose-Capillary: 77 mg/dL (ref 65–99)
Glucose-Capillary: 73 mg/dL (ref 65–99)
Glucose-Capillary: 79 mg/dL (ref 65–99)
Glucose-Capillary: 74 mg/dL (ref 65–99)
Glucose-Capillary: 95 mg/dL (ref 65–99)

## 2015-08-26 LAB — BASIC METABOLIC PANEL
Anion gap: 4 — ABNORMAL LOW (ref 5–15)
BUN: 25 mg/dL — AB (ref 6–20)
CALCIUM: 9 mg/dL (ref 8.9–10.3)
CO2: 34 mmol/L — AB (ref 22–32)
CREATININE: 0.88 mg/dL (ref 0.44–1.00)
Chloride: 109 mmol/L (ref 101–111)
GFR calc Af Amer: >60 mL/min (ref >60)
GLUCOSE: 83 mg/dL (ref 65–99)
Potassium: 4.3 mmol/L (ref 3.5–5.1)
Sodium: 147 mmol/L — ABNORMAL HIGH (ref 135–145)

## 2015-08-26 LAB — PHOSPHORUS: Phosphorus: 4.7 mg/dL — ABNORMAL HIGH (ref 2.5–4.6)

## 2015-08-26 MED ORDER — LIVING BETTER WITH HEART FAILURE BOOK
Freq: Once | Status: AC
Start: 2015-08-26 — End: 2015-08-26
  Administered 2015-08-26: 14:00:00
  Filled 2015-08-26: qty 1

## 2015-08-26 MED ORDER — METOPROLOL TARTRATE 25 MG PO TABS
12.5000 mg | ORAL_TABLET | Freq: Two times a day (BID) | ORAL | Status: DC
  Administered 2015-08-26 – 2015-08-31 (×11): 12.5 mg via ORAL
  Filled 2015-08-26 (×11): qty 1

## 2015-08-26 MED ORDER — FUROSEMIDE 40 MG PO TABS
40.0000 mg | ORAL_TABLET | Freq: Every day | ORAL | Status: DC
  Administered 2015-08-27 – 2015-08-31 (×5): 40 mg via ORAL
  Filled 2015-08-26 (×5): qty 1

## 2015-08-26 MED ORDER — POLYVINYL ALCOHOL 1.4 % OP SOLN
2.0000 [drp] | OPHTHALMIC | Status: DC | PRN
  Filled 2015-08-26: qty 15

## 2015-08-26 MED ORDER — CETYLPYRIDINIUM CHLORIDE 0.05 % MT LIQD
7.0000 mL | Freq: Two times a day (BID) | OROMUCOSAL | Status: DC
  Administered 2015-08-27 – 2015-08-31 (×6): 7 mL via OROMUCOSAL

## 2015-08-26 MED ORDER — DEXTROSE-NACL 5-0.9 % IV SOLN
INTRAVENOUS | Status: DC
  Administered 2015-08-26: 14:00:00 via INTRAVENOUS

## 2015-08-26 NOTE — Progress Notes (Addendum)
R&L Cath tentatively scheduled for Mon at 10:30am with Dr. Katrinka Blazing. Neely with cath lab has made note that patient is at [facility] and they will call [facility] prior to scheduled time to transport patient day of procedure. If her labs remain stable and the plan is still for cath on Monday, will need pre-cath orders written towards the end of this weekend. Maykel Reitter PA-C

## 2015-08-26 NOTE — Progress Notes (Signed)
Nutrition Follow-up  DOCUMENTATION CODES:   Morbid obesity  INTERVENTION:  - Diet advancement per SLP recommendation. - RD will follow-up 7/3. - Diet education related to CHF prior to d/c.  NUTRITION DIAGNOSIS:   Inadequate oral intake related to inability to eat as evidenced by NPO status. -ongoing  GOAL:   Patient will meet greater than or equal to 90% of their needs -unable to meet at this time.  MONITOR:   Diet advancement, Weight trends, Labs, I & O's  ASSESSMENT:   45 y.o. female with medical history significant of recent diagnosis gradfe 1 diastolic heart failure on recent hospitalization. Discharged from [facility] on May 29 and generally feeling well until about 4 days ago. She began feeling sluggish like the previous presentation. Symptoms worse when outside and in the humidity - significant difficulty catching her breath. Would feel nauseated. Symptoms ongoing the last 4 days. Today, she woke up and turned off the [facility] in the house (had also turned off her fan in her room last night). Went from bed to bathroom and was completely exhausted. With these symptoms, she decided to come back in. No chest pain/pressure other than following late-night eating a few days ago which led to indigestion. Does experience cough when she gets very hot or feels congested. +LE edema. Has not obtained a scale for daily weights - she was told to take Lasix if her weight increased more than 2 pounds/day. She did take the Lasix a couple of times and she did notice improvement, but she didn't take more (last took last night) because she was noticing some cramping of legs/fingers/abdomen. Also has been trying to fluid restrict and so concerned that she wasn't taking in enough fluid. Does note ongoing nausea following meals, and this is new for her.  6/30 Pt continues to be NPO and RN notes from yesterday PM state pt tolerating ice chips without issue. Pt with some hoarseness during discussion and  reports that her voice is still coming back and she continues with soreness to throat following extubation yesterday AM. She states she had ice chips earlier this AM and tolerated them well but feels she gets "strangled" when attempting to take sips of liquids. Pt states that she was informed that SLP is to see her d/t this; will monitor for SLP recommendations concerning diet advancement.   Pt states that PTA she was eating 2 small meals/day on a good day. She states that this was mainly due to need to take medication rather than overt desire to eat. She indicates that weight was trending up related to fluid and that during last hospitalization she lost 20 lbs; pt feels this was due to fluid removal in addition to decreased intakes with decreased appetite.   RD to follow-up 7/3. Pt would benefit from CHF diet education prior to d/c. Cardiology note from 6/29 states pt did not have a scale at home to monitor weight since last d/c.  Medications reviewed; 40 mg oral Lasix/day, sliding scale Novolog, 40 mg Protonix/day, 40 mEq KCl BID. Labs reviewed; Na: 147 mmol/L, BUN: 25 mg/dL, Phos: 4.7 mg/dL.  IVF: D5-NS @ 20 mL/hr (82 kcal).  6/29 - Pt extubated shortly after RD visit this AM.  - Confirmed during rounds that pt is extubated, on [place], and OGT removed at time of extubation. - Will d/c TF and Prostat order.  - Estimated nutrition needs updated at this time.  - Per chart review, weight down 4 lbs since yesterday (326 lbs today versus  330 lbs yesterday).   Diet Order:  Diet NPO time specified  Skin:  Reviewed, no issues  Last BM:  6/29  Height:   Ht Readings from Last 1 Encounters:  08/19/15  (1.651 m)    Weight:   Wt Readings from Last 1 Encounters:  08/26/15 316 lb 9.3 oz (143.6 kg)    Ideal Body Weight:  56.82 kg (kg)  BMI:  Body mass index is 52.68 kg/(m^2).  Estimated Nutritional Needs:   Kcal:  2025-2225  Protein:  110-120 grams  Fluid:  1.8-2 L/day  EDUCATION  NEEDS:   No education needs identified at this time     Trenton Gammon, MS, RD, LDN Inpatient Clinical Dietitian Pager # [phone] After hours/weekend pager # [phone]

## 2015-08-26 NOTE — Progress Notes (Signed)
PULMONARY / CRITICAL CARE MEDICINE   Name: Jennifer Owens MRN: [id number] DOB: 02/11/1971    ADMISSION DATE:  08/17/2015 CONSULTATION DATE:  08/18/15  REFERRING MD:  TRH  CHIEF COMPLAINT:  SOB  HISTORY OF PRESENT ILLNESS:  Jennifer Owens is a 45 y.o. female admitted on 6/21 w/ working dx of acute on chronic hypercarbic respiratory failure in the setting of decompensated diastolic heart failure, pulmonary edema, and Cor Pulmonale. Placed on NIPPV and treated w/ IV diuresis/ no sig improvement. PCCM assumed care as of 6/23. Found to have MSSA PNA.  Subsequently extubated 6/29.  Cardiology following for CHF.    SUBJECTIVE:   RN reports concern for hypoglycemia, ? Eating but concerned for swallowing ability.  Pt reports tolerating ice but not liquids - coughs / choking episodes  VITAL SIGNS: BP 127/75 mmHg  Pulse 80  Temp(Src) 98.9 F (37.2 C) (Oral)  Resp 31  Ht  (1.651 m)  Wt 316 lb 9.3 oz (143.6 kg)  BMI 52.68 kg/m2  SpO2 94%  LMP 05/21/2015 (Approximate)  HEMODYNAMICS: CVP:  [9 mmHg-14 mmHg] 11 mmHg  VENTILATOR SETTINGS: Vent Mode:  [-] PSV;CPAP FiO2 (%):  [40 %] 40 % PEEP:  [0 cmH20-6 cmH20] 0 cmH20 Pressure Support:  [0 cmH20-5 cmH20] 0 cmH20  INTAKE / OUTPUT: I/O last 3 completed shifts: In: 2289.8 [I.V.:629.8; NG/GT:1260; IV Piggyback:400] Out: 6395 [Urine:6395]   PHYSICAL EXAMINATION: General: Morbidly obese female, no distress sitting in bed Neuro:  Alert, no focal deficits HEENT: PERRLA, ETT,  Cardiovascular: S1, S2, RRR, No MRG Lungs: Clear, no wheeze or crackles Abdomen: + BS, Non tender, non distended Musculoskeletal: trace edema Skin: Intact  LABS:  BMET  Recent Labs Lab 08/24/15 0330 08/25/15 0342 08/26/15 0405  NA 140 143 147*  K 3.7 3.4* 4.3  CL 103 106 109  CO2 31 29 34*  BUN 40* 37* 25*  CREATININE 1.23* 1.17* 0.88  GLUCOSE 97 102* 83    Electrolytes  Recent Labs Lab 08/20/15 1810  08/23/15 1035  08/24/15 0330  08/25/15 0342 08/26/15 0405  CALCIUM  --   < >  --   < > 8.4* 8.8* 9.0  MG 2.0  --  1.5*  --  2.6*  --  2.2  PHOS 2.3*  --  3.8  --   --   --  4.7*  < > = values in this interval not displayed.  CBC  Recent Labs Lab 08/24/15 0330 08/25/15 0342 08/26/15 0405  WBC 7.4 7.1 7.1  HGB 9.9* 9.9* 11.2*  HCT 31.7* 31.6* 38.1  PLT 63* 82* 106*    Coag's No results for input(s): APTT, INR in the last 168 hours.  Sepsis Markers  Recent Labs Lab 08/20/15 0440  PROCALCITON 0.73    ABG  Recent Labs Lab 08/19/15 1130 08/20/15 0347 08/24/15 0500  PHART 7.337* 7.438 7.459*  PCO2ART 53.8* 40.5 42.3  PO2ART 138* 61.3* 79.7*    Liver Enzymes No results for input(s): AST, ALT, ALKPHOS, BILITOT, ALBUMIN in the last 168 hours.  Cardiac Enzymes  Recent Labs Lab 08/23/15 2125 08/24/15 0330 08/24/15 0904  TROPONINI 0.06* 0.05* 0.05*    Glucose  Recent Labs Lab 08/25/15 0717 08/25/15 1206 08/25/15 1516 08/25/15 1932 08/25/15 2317 08/26/15 0355  GLUCAP 103* 128* 108* 83 77 73    Imaging Dg Chest Port 1 View  08/26/2015  CLINICAL DATA:  Respiratory failure. EXAM: PORTABLE CHEST 1 VIEW COMPARISON:  08/24/2015. FINDINGS: Endotracheal tube, NG tube, Left IJ  line in stable position. Cardiomegaly with diffuse bilateral pulmonary infiltrates consistent with pulmonary edema. Pulmonary edema is increased from prior exam. No pleural effusion or pneumothorax . IMPRESSION: 1. Interim removal of endotracheal tube and NG tube. Left IJ line in stable position. 2. Cardiomegaly with diffuse bilateral pulmonary infiltrates consistent with pulmonary edema. Pulmonary edema has increased from prior exam. Electronically Signed   By: Maisie Fus  Register   On: 08/26/2015 06:49     STUDIES:  CXR 6/29 > Improved edema ECHO 6/28 >> LVEF 35-40%, dilated RV  CULTURES: MRSA 6/22 >> negative Blood culture 6/22 > negative Trach culture 6/25 >> MSSA   ANTIBIOTICS: Vanco 6/25 >> 6/28 Zosyn 6/25  >> 6/28 Cefazolin 6/28 >>   SIGNIFICANT EVENTS: 6/21  Admit. 6/22  Transferred to ICU and started on BiPAP/NIV for respiratory acidosis / hypoxemic and hypercarbic respiratory failure. 6/23  Intubated 6/28  Runs of VT overnight, mild elevation of troponin. 6/29  Extubated  6/30  SLP ordered   LINES/TUBES: ETT 6/23 >> 6/29 LIJ 6/23 >>  DISCUSSION: 44 Y/O with CHF, +/- PNA  ASSESSMENT / PLAN:  PULMONARY A: Acute on chronic hypoxemic and hypercarbic respiratory failure 2/2 OHS, Likely OSA, Diastolic Dysfxn -failed NIV MSSA PNA  H/O asthma in mild exacerbation Tobacco use disorder. P:   Pulmonary hygiene post extubation - IS, mobilize O2 as needed to support saturations > 92% Diuresis as renal function / BP permit  See ID  CARDIOVASCULAR A:  Hx dCHF with acute exacerbation Newly noted to have new low EF VT on tele Pulmonary Edema  P:  Cardiac monitoring, replete lytes Lasix 40 mg bid ECHO results noted. Cardiology following for low EF, VTs   RENAL A:   AKI - Improved Hyperkalemia - resolved Hypomagnesemia  P:   Continue lasix as above Strict I&O Replace electrolytes as indicted Trend BMP  GASTROINTESTINAL A:   Morbid obesity. Nutrition. P:   SLP evaluation  PPI for SUP   HEMATOLOGIC A:   Thrombocytopenia - chronic, HIT panel negative  VTE Prophylaxis. P:  Monitor platelet counts  SCD's   INFECTIOUS A:   MSSA PNA P:   Abx as above Follow PCT  ENDOCRINE A:   Mild Hypoglycemia  P:   Monitor glucose on BMP. D5NS at 44ml/hr until eating SSI q 4   NEUROLOGIC A:   Acute hypercarbic encephalopathy. P:   RASS goal: n/a Push PT efforts   Family updated: Pt has a girlfriend [name] and she made her decision maker -updated 6/25.  No family at bedside 6/30.  Patient updated on plan of care.  Interdisciplinary Family Meeting v Palliative Care Meeting:  Due by: 06/28.   Transfer primary SVC to [facility] as of 0700 7/1.     Canary Brim, NP-C [facility] Pgr: [phone] or if no answer [phone] 08/26/2015, 10:14 AM   Attending note: I have seen and examined the patient with nurse practitioner/resident and agree with the note. History, labs and imaging reviewed.  44 Y/O with morbid obesity, OSA/OHS, diastolic cardiomyopathy admitted with acute hypoxic hypercarbic respirator failure, MSSA pneumonia Noted to have NSVT and new reduction is systolic function Cardiology is following and plan for cath on Monday. She is stable post extubation.  OK for transfer from ICU.  Chilton Greathouse MD [facility] Pager [phone] If no answer or after 3pm call: [phone] 08/26/2015, 11:59 AM

## 2015-08-26 NOTE — Evaluation (Signed)
Clinical/Bedside Swallow Evaluation Patient Details  Name: Jennifer Owens MRN: [id number] Date of Birth: 1970/06/26  Today's Date: 08/26/2015 Time: SLP Start Time (ACUTE ONLY): 1400 SLP Stop Time (ACUTE ONLY): 1449 SLP Time Calculation (min) (ACUTE ONLY): 49 min  Past Medical History:  Past Medical History  Diagnosis Date  . Sciatica   . Asthma   . Diastolic dysfunction with heart failure (HCC)   . Morbid obesity (HCC)   . Thrombocytopenia (HCC)   . Malignant hypertension   . Tobacco abuse    Past Surgical History:  Past Surgical History  Procedure Laterality Date  . Cholecystectomy    . Appendectomy    . Cesarean section    . Small intestine surgery     HPI:  45 yo female adm to [facility] with CHF exacerbation - requiring intubation 6/23-6/29/17.  PMH + for CHF, asthma, malignant HTN, morbid obesity.  Pt denies dysphagia prior to admission.    Assessment / Plan / Recommendation Clinical Impression  Pt presents with symptoms of reversible acute dysphagia that has improved since this am.  Pt has a baseline cough due to CHF making differential diagnosis challenging.  Hoarse voice may be indicative of pharyngeal/laryngeal edema.  Pt observed consuming medicine with water, ice chips, soda, applesauce and graham cracker.   Intermittent cough with and without intake,  productive to clear secretions.  She does report sensation of mild pharyngeal residuals with solids that she states clears with dry swallows and liquids. Pt indicates swallow ability currently is a level 8 of 10.  SLP educated her extensively to dysphagia, symptoms of dysphagia and need to use caution with eating.  Also advised anticipation of continued improvement.       Aspiration Risk  Mild aspiration risk    Diet Recommendation Thin liquid (clears)   Liquid Administration via: Cup Medication Administration: Whole meds with liquid (large pills with applesauce ) Supervision: Patient able to self feed Compensations: Slow  rate;Small sips/bites (cough and expectorate) Postural Changes: Remain upright for at least 30 minutes after po intake;Seated upright at 90 degrees    Other  Recommendations Oral Care Recommendations: Oral care QID Other Recommendations: Have oral suction available   Follow up Recommendations       Frequency and Duration min 2x/week  1 week       Prognosis Prognosis for Safe Diet Advancement: Good      Swallow Study   General Date of Onset: 08/26/15 HPI: 45 yo female adm to [facility] with CHF exacerbation - requiring intubation 6/23-6/29/17.  PMH + for CHF, asthma, malignant HTN, morbid obesity.  Pt denies dysphagia prior to admission.  Type of Study: Bedside Swallow Evaluation Diet Prior to this Study: NPO Temperature Spikes Noted: Yes (low grade) Respiratory Status: Nasal cannula (6 liters) Behavior/Cognition: Alert;Cooperative;Pleasant mood Oral Cavity Assessment: Within Functional Limits Oral Care Completed by SLP: No Oral Cavity - Dentition: Adequate natural dentition Vision: Functional for self-feeding Self-Feeding Abilities: Able to feed self Patient Positioning: Upright in bed Baseline Vocal Quality: Breathy;Hoarse;Low vocal intensity Volitional Cough: Strong Volitional Swallow: Able to elicit    Oral/Motor/Sensory Function Overall Oral Motor/Sensory Function: Within functional limits   Ice Chips Ice chips: Within functional limits Presentation: Spoon;Self Fed   Thin Liquid Thin Liquid: Impaired Presentation: Cup;Self Fed;Spoon;Straw Pharyngeal  Phase Impairments: Multiple swallows;Cough - Delayed    Nectar Thick Nectar Thick Liquid: Not tested   Honey Thick Honey Thick Liquid: Not tested   Puree Presentation: Self Fed;Spoon Pharyngeal Phase Impairments: Multiple swallows;Other (comments)  Other Comments: sensation of residuals   Solid   GO   Presentation: Self Fed Oral Phase Impairments: Reduced lingual movement/coordination Pharyngeal Phase Impairments: Cough -  Delayed Other Comments: delayed cough with expectoration of secretions        Donavan Burnet, MS [facility] SLP [phone]

## 2015-08-26 NOTE — Progress Notes (Signed)
Patient: Jennifer Owens / Admit Date: 08/17/2015 / Date of Encounter: 08/26/2015, 8:36 AM   Subjective: Breathing is much better. CP improved. She is concerned about her heart given family history of uncle dying at age 46.  Objective: Telemetry NSR occasional PVCs Physical Exam: Blood pressure 127/75, pulse 80, temperature 99 F (37.2 C), temperature source Oral, resp. rate 31, height 5' 5" (1.651 m), weight 316 lb 9.3 oz (143.6 kg), last menstrual period 05/21/2015, SpO2 100 %. General: Well developed, well nourished obese AAF in no acute distress. Head: Normocephalic, atraumatic, sclera non-icteric, no xanthomas, nares are without discharge. Hirsute appearance Neck: JVP difficult to assess with habitus Lungs: Clear bilaterally to auscultation without wheezes, rales, or rhonchi. Breathing is unlabored. Heart: RRR S1 S2 without murmurs, rubs, or gallops.  Abdomen: Soft, non-tender, non-distended with normoactive bowel sounds. No rebound/guarding. Extremities: No clubbing or cyanosis. Trace edema superimposed on large baseline leg habitus.  Neuro: Alert and oriented X 3. Moves all extremities spontaneously. Psych:  Responds to questions appropriately with a normal affect.   Intake/Output Summary (Last 24 hours) at 08/26/15 0836 Last data filed at 08/26/15 0759  Gross per 24 hour  Intake 629.83 ml  Output   4085 ml  Net -3455.17 ml    Inpatient Medications:  . antiseptic oral rinse  7 mL Mouth Rinse BID  . antiseptic oral rinse  7 mL Mouth Rinse QID  . budesonide (PULMICORT) nebulizer solution  0.5 mg Nebulization BID  .  ceFAZolin (ANCEF) IV  2 g Intravenous Q8H  . chlorhexidine gluconate (SAGE KIT)  15 mL Mouth Rinse BID  . fentaNYL (SUBLIMAZE) injection  50 mcg Intravenous Once  . furosemide  40 mg Intravenous BID  . insulin aspart  0-20 Units Subcutaneous Q4H  . ipratropium-albuterol  3 mL Nebulization Q6H  . pantoprazole sodium  40 mg Per Tube Daily  . potassium chloride   40 mEq Oral BID  . sodium chloride  1,000 mL Intravenous Once   Infusions:  . fentaNYL infusion INTRAVENOUS Stopped (08/25/15 0811)    Labs:  Recent Labs  08/23/15 1035  08/24/15 0330 08/25/15 0342 08/26/15 0405  NA  --   < > 140 143 147*  K  --   < > 3.7 3.4* 4.3  CL  --   < > 103 106 109  CO2  --   < > 31 29 34*  GLUCOSE  --   < > 97 102* 83  BUN  --   < > 40* 37* 25*  CREATININE  --   < > 1.23* 1.17* 0.88  CALCIUM  --   < > 8.4* 8.8* 9.0  MG 1.5*  --  2.6*  --  2.2  PHOS 3.8  --   --   --  4.7*  < > = values in this interval not displayed. No results for input(s): AST, ALT, ALKPHOS, BILITOT, PROT, ALBUMIN in the last 72 hours.  Recent Labs  08/25/15 0342 08/26/15 0405  WBC 7.1 7.1  HGB 9.9* 11.2*  HCT 31.6* 38.1  MCV 98.1 102.7*  PLT 82* 106*    Recent Labs  08/23/15 2125 08/24/15 0330 08/24/15 0904  TROPONINI 0.06* 0.05* 0.05*   Invalid input(s): POCBNP No results for input(s): HGBA1C in the last 72 hours.   Radiology/Studies:  Dg Chest 1 View  08/18/2015  CLINICAL DATA:  Acute respiratory distress EXAM: CHEST 1 VIEW COMPARISON:  08/17/2015 FINDINGS: Stable cardiomegaly. Bilateral interstitial thickening. No pleural effusion or pneumothorax.   No acute osseous abnormality. IMPRESSION: Cardiomegaly with pulmonary vascular congestion. No significant interval change compared with 08/17/2015. Electronically Signed   By: Hetal  Patel   On: 08/18/2015 19:43   Dg Chest 2 View  08/17/2015  CLINICAL DATA:  Shortness of breath for several weeks, question CHF, history asthma and smoking EXAM: CHEST  2 VIEW COMPARISON:  07/21/2015 FINDINGS: Enlargement of cardiac silhouette with pulmonary vascular congestion. Mediastinal contour stable. No definite acute infiltrate, pleural effusion or pneumothorax. Bones unremarkable. IMPRESSION: Enlargement of cardiac silhouette with pulmonary vascular congestion. No definite acute infiltrate or pulmonary edema. Electronically Signed    By: Mark  Boles M.D.   On: 08/17/2015 15:11   Dg Abd 1 View  08/19/2015  CLINICAL DATA:  Orogastric tube placement. EXAM: ABDOMEN - 1 VIEW COMPARISON:  None. FINDINGS: Orogastric tube tip projects in the distal stomach. IMPRESSION: Well-positioned orogastric tube. Electronically Signed   By: David  Ormond M.D.   On: 08/19/2015 16:02   Dg Chest Port 1 View  08/26/2015  CLINICAL DATA:  Respiratory failure. EXAM: PORTABLE CHEST 1 VIEW COMPARISON:  08/24/2015. FINDINGS: Endotracheal tube, NG tube, Left IJ line in stable position. Cardiomegaly with diffuse bilateral pulmonary infiltrates consistent with pulmonary edema. Pulmonary edema is increased from prior exam. No pleural effusion or pneumothorax . IMPRESSION: 1. Interim removal of endotracheal tube and NG tube. Left IJ line in stable position. 2. Cardiomegaly with diffuse bilateral pulmonary infiltrates consistent with pulmonary edema. Pulmonary edema has increased from prior exam. Electronically Signed   By: Thomas  Register   On: 08/26/2015 06:49   Dg Chest Port 1 View  08/24/2015  CLINICAL DATA:  Acute respiratory failure EXAM: PORTABLE CHEST 1 VIEW COMPARISON:  08/22/2015 FINDINGS: Endotracheal tube remains in good position. NG tube enters the stomach. Left jugular central venous catheter tip in the lower SVC unchanged. No pneumothorax Improving diffuse bilateral airspace disease compatible with edema. Marked cardiac enlargement remains. No effusion. Mild bibasilar atelectasis has improved. IMPRESSION: Improved aeration of the lungs with decrease in pulmonary edema. Improvement in bibasilar atelectasis. Electronically Signed   By: Charles  Clark M.D.   On: 08/24/2015 07:10   Dg Chest Port 1 View  08/22/2015  CLINICAL DATA:  Hypoxia EXAM: PORTABLE CHEST 1 VIEW COMPARISON:  August 21, 2015 FINDINGS: Endotracheal tube tip is 3.8 cm above the carina. Central catheter tip is in the superior vena cava near the cavoatrial junction, stable. Nasogastric tube  tip and side port are below the diaphragm. No pneumothorax. There is moderate interstitial and patchy alveolar edema, stable. There are small pleural effusions bilaterally with generalized cardiomegaly and pulmonary venous hypertension. No adenopathy is evident. IMPRESSION: Tube and catheter positions as described without pneumothorax. Persistent changes of congestive heart failure. No new opacity. Stable cardiac silhouette. Electronically Signed   By: William  Woodruff III M.D.   On: 08/22/2015 07:12   Dg Chest Port 1 View  08/21/2015  CLINICAL DATA:  Acute hypoxemic respiratory failure EXAM: PORTABLE CHEST 1 VIEW COMPARISON:  Chest radiograph from one day prior. FINDINGS: Endotracheal tube tip is 4.0 cm above the carina. Enteric tube enters lower thoracic esophagus with the tip not seen on this image. Left internal jugular central venous catheter terminates at the cavoatrial junction. Stable cardiomediastinal silhouette with moderate cardiomegaly. No pneumothorax. Stable small bilateral pleural effusions. Stable diffuse hazy lung opacities. Low lung volumes and bibasilar atelectasis appears stable. IMPRESSION: 1. Support structures as described.  No pneumothorax . 2. Stable severe congestive heart failure and small bilateral pleural   effusions. 3. Stable low lung volumes with bibasilar atelectasis. Electronically Signed   By: Jason A Poff M.D.   On: 08/21/2015 07:52   Dg Chest Port 1 View  08/20/2015  CLINICAL DATA:  Respiratory failure. EXAM: PORTABLE CHEST 1 VIEW COMPARISON:  08/19/2015 FINDINGS: N tracheal tube tip is approximately 3.5 cm above the carina. Lungs show lower volumes bilaterally with potential mild interstitial edema. There remains significant cardiomegaly. There may be a component of small pleural effusions bilaterally. IMPRESSION: Lower lung volumes with potential mild interstitial edema. Stable cardiomegaly and potential bilateral small pleural effusions. Electronically Signed   By: Glenn   Yamagata M.D.   On: 08/20/2015 08:55   Dg Chest Port 1 View  08/19/2015  CLINICAL DATA:  eval for central line placement. 44 y.o. female with a hx of HTN, Asthma, Diastolic CHF, presented to the Emergency Department several days ago complaining of worsening shortness of breath for the past 3 days. Notes shortness of breath for the past few weeks with admission on 07-22-15 due to Acute Diastolic CHF. Malignant HTN. Morbid obesity EXAM: PORTABLE CHEST 1 VIEW COMPARISON:  08/18/2015 FINDINGS: Marked cardiomegaly, stable. No mediastinal or hilar masses. Lung volumes are low. There is mild hazy opacity at the lung bases likely combination small effusions and atelectasis. Pulmonary vascular congestion without overt pulmonary edema. No convincing pneumonia. No pneumothorax. Since prior study, endotracheal tube has been placed. Tip projects 3.5 cm above the chronic. There is a new left internal jugular central venous line with its tip at the caval atrial junction. IMPRESSION: 1. Endotracheal tube and left internal jugular central venous line are well positioned. 2. No pneumothorax. 3. Cardiomegaly, central vascular congestion, probable small effusions and lung base atelectasis. Lung base opacity mildly increased from the previous day's study. No convincing pneumonia or overt pulmonary edema. Electronically Signed   By: David  Ormond M.D.   On: 08/19/2015 11:47     Assessment and Plan  44F with morbid obesity (weight>300lb), HTN, asthma, chronic diastolic dysfunction, chronic thrombocytopenia, tobacco abuse who presented back to the hospital with SOB. She was recently hospitalized 06/2015 with acute diastolic CHF and NSVT. She had not been able to follow-up after this admission and ran out of medications which likely contributed to readmission. She was readmitted 6/29 with SOB, acute hypoxic/hypercarbic respiratory failure requiring intubation, and acute hypercarbic encephalopathy. Cardiology asked to see for new  biventricular dysfunction and NSVT. 2D Echo 08/24/15: EF 35-40%, moderate RV systolic dysfunction with dilated RV (prev EF 55-60% on 07/22/15). Resp cx 6/25 staph aureus -> being treated for possible HCAP.  1. Acute combined CHF with new cardiomyopathy - weights variable, but down another 10lb from yesterday - brisk diuresis yesterday with -4L out (-2.6L total). D/c weight in 06/2015 was 321. Na rising -> will review diuretic regimen with MD. Hold off on ACEI given possibility of addition of BB (see #2).  2. NSVT - lytes WNL. Will clarify plan for BB with MD - metoprolol 12.5mg BID mentioned in note but I do not see this on MAR.  3. Elevated troponin - add ASA if OK with IM. Plan LHC when more stable given LV dysfunction and NSVT. Check lipids/LFTs in AM and plan to add statin based on results.  4. AKI with hyperkalemia (Cr>2 on adm) - improved.  5. Anemia/thrombocytopenia - indices improved today. Per IM.  Signed, Dayna Dunn PA-C Pager: [phone]    

## 2015-08-27 DIAGNOSIS — J1520 Pneumonia due to staphylococcus, unspecified: Secondary | ICD-10-CM | POA: Insufficient documentation

## 2015-08-27 DIAGNOSIS — I5023 Acute on chronic systolic (congestive) heart failure: Secondary | ICD-10-CM

## 2015-08-27 DIAGNOSIS — I429 Cardiomyopathy, unspecified: Secondary | ICD-10-CM

## 2015-08-27 DIAGNOSIS — I10 Essential (primary) hypertension: Secondary | ICD-10-CM

## 2015-08-27 DIAGNOSIS — I5042 Chronic combined systolic (congestive) and diastolic (congestive) heart failure: Secondary | ICD-10-CM | POA: Insufficient documentation

## 2015-08-27 DIAGNOSIS — J9622 Acute and chronic respiratory failure with hypercapnia: Secondary | ICD-10-CM

## 2015-08-27 LAB — BASIC METABOLIC PANEL
ANION GAP: 5 (ref 5–15)
BUN: 22 mg/dL — ABNORMAL HIGH (ref 6–20)
CO2: 35 mmol/L — AB (ref 22–32)
Calcium: 8.8 mg/dL — ABNORMAL LOW (ref 8.9–10.3)
Chloride: 102 mmol/L (ref 101–111)
Creatinine, Ser: 0.98 mg/dL (ref 0.44–1.00)
GFR calc Af Amer: >60 mL/min (ref >60)
GFR calc non Af Amer: >60 mL/min (ref >60)
GLUCOSE: 94 mg/dL (ref 65–99)
POTASSIUM: 4.3 mmol/L (ref 3.5–5.1)
Sodium: 142 mmol/L (ref 135–145)

## 2015-08-27 LAB — LIPID PANEL
CHOLESTEROL: 149 mg/dL (ref 0–200)
HDL: 20 mg/dL — ABNORMAL LOW (ref >40)
LDL Cholesterol: 96 mg/dL (ref 0–99)
TRIGLYCERIDES: 166 mg/dL — AB (ref <150)
Total CHOL/HDL Ratio: 7.5 RATIO
VLDL: 33 mg/dL (ref 0–40)

## 2015-08-27 LAB — HEPATIC FUNCTION PANEL
ALBUMIN: 3.1 g/dL — AB (ref 3.5–5.0)
ALT: 18 U/L (ref 14–54)
AST: 31 U/L (ref 15–41)
Alkaline Phosphatase: 36 U/L — ABNORMAL LOW (ref 38–126)
BILIRUBIN DIRECT: 0.2 mg/dL (ref 0.1–0.5)
BILIRUBIN TOTAL: 1 mg/dL (ref 0.3–1.2)
Indirect Bilirubin: 0.8 mg/dL (ref 0.3–0.9)
Total Protein: 7.1 g/dL (ref 6.5–8.1)

## 2015-08-27 LAB — TSH: TSH: 0.659 u[IU]/mL (ref 0.350–4.500)

## 2015-08-27 NOTE — Progress Notes (Signed)
PROGRESS NOTE  Jennifer Owens:[id number] DOB: [birth year] DOA: 08/17/2015 PCP: PROVIDER NOT IN SYSTEM  HPI/Recap of past 42 hours: 45 year old female with past medical history of diastolic heart failure, chronic hypercarbic respiratory failure and cor pulmonale who was admitted on 6/21 for acute on chronic respiratory failure decompensated heart failure and also found to have MSSA pneumonia. Patient started to decline despite diuresis and ended up being intubated with critical care assuming management. Extubated on 6/29 and hospitalist assumed care on 7/1. Cardiology has been following with aggressive diuresis and plans for cardiac cath on Monday 7/3. Course complicated by episodes of nonsustained VT.  Today, patient feeling better. She has lost almost 5 L of fluid and is down approximately 9 pounds. She is breathing comfortably with no chest pain or dyspnea on exertion.  Assessment/Plan: Principal Problem:  Acute on chronic systolic/Diastolic CHF exacerbation [place]): Appreciate cardiology help. Continue Lasix Active Problems: Morbid obesity: Patient meets criteria with BMI greater than 40    Thrombocytopenia (HCC)   Tobacco abuse: Counseled.   Essential hypertension: Her pressures improved with diuresis   Acute on chronic respiratory failure with hypercapnia (HCC): Getting closer to baseline. Continue diuresis.  Nonsustained ventricular tachycardia: Electrolytes checked and found to be normal. Patient started on metoprolol twice a day  Elevated troponin: No evidence of ACS. Felt to be secondary to CHF.  Acute kidney injury with hyperkalemia: Likely secondary to poor perfusion. With Lasix, creatinine now back to normal   Code Status: Full code   Family Communication: Left message for family   Disposition Plan: Transfer to floor. Disposition determinant on cardiac catheterization Monday    Consultants:  Cardiology  Critical care   Procedures:  Echocardiogram done 6/28:  Limited study. Left ventricle moderately dilated with decreased systolic function and EF of 35-40 percent  On ventilator 6/23-6/29  Antimicrobials:  IV vancomycin 6/25-6/28  IV Zosyn 6/25-6/28  IV Ancef 6/28-present  DVT prophylaxis: SCDs   Objective: Filed Vitals:   08/27/15 0859 08/27/15 0900 08/27/15 1000 08/27/15 1100  BP:  117/82 136/83 148/88  Pulse:      Temp:      TempSrc:      Resp:  Height:      Weight:      SpO2: 99%       Intake/Output Summary (Last 24 hours) at 08/27/15 1226 Last data filed at 08/27/15 0800  Gross per 24 hour  Intake    480 ml  Output   2350 ml  Net  -1870 ml   Filed Weights   08/25/15 0100 08/26/15 0000 08/27/15 0400  Weight: 148.3 kg (326 lb 15.1 oz) 143.6 kg (316 lb 9.3 oz) 142.5 kg (314 lb 2.5 oz)    Exam:   General:  Alert and oriented 3, no acute distress   Cardiovascular: Regular rate and rhythm, S1-S2, 2/6 systolic ejection murmur   Respiratory: Decreased breath sounds throughout secondary to body habitus   Abdomen: Soft, obese, nontender, positive bowel sounds   Musculoskeletal: No clubbing or cyanosis, trace edema   Skin: No skin breaks, tears or lesions  Psychiatry: Patient is appropriate, no evidence of psychoses    Data Reviewed: CBC:  Recent Labs Lab 08/22/15 0400 08/23/15 0340 08/24/15 0330 08/25/15 0342 08/26/15 0405  WBC 8.9 8.5 7.4 7.1 7.1  HGB 10.8* 10.1* 9.9* 9.9* 11.2*  HCT 34.8* 32.2* 31.7* 31.6* 38.1  MCV 97.5 96.4 96.4 98.1 102.7*  PLT 48* 45* 63* 82* 106*   Basic  Metabolic Panel:  Recent Labs Lab 08/20/15 1810  08/23/15 1035 08/23/15 2125 08/24/15 0330 08/25/15 0342 08/26/15 0405 08/27/15 0345  NA  --   < >  --  141 140 143 147* 142  K  --   < >  --  3.9 3.7 3.4* 4.3 4.3  CL  --   < >  --  103 103 106 109 102  CO2  --   < >  --  30 31 29  34* 35*  GLUCOSE  --   < >  --  95 97 102* 83 94  BUN  --   < >  --  40* 40* 37* 25* 22*  CREATININE  --   < >  --  1.25*  1.23* 1.17* 0.88 0.98  CALCIUM  --   < >  --  8.3* 8.4* 8.8* 9.0 8.8*  MG 2.0  --  1.5*  --  2.6*  --  2.2  --   PHOS 2.3*  --  3.8  --   --   --  4.7*  --   < > = values in this interval not displayed. GFR: Estimated Creatinine Clearance: 104.4 mL/min (by C-G formula based on Cr of 0.98). Liver Function Tests:  Recent Labs Lab 08/27/15 0345  AST 31  ALT 18  ALKPHOS 36*  BILITOT 1.0  PROT 7.1  ALBUMIN 3.1*   No results for input(s): LIPASE, AMYLASE in the last 168 hours. No results for input(s): AMMONIA in the last 168 hours. Coagulation Profile: No results for input(s): INR, PROTIME in the last 168 hours. Cardiac Enzymes:  Recent Labs Lab 08/23/15 2125 08/24/15 0330 08/24/15 0904  TROPONINI 0.06* 0.05* 0.05*   BNP (last 3 results) No results for input(s): PROBNP in the last 8760 hours. HbA1C: No results for input(s): HGBA1C in the last 72 hours. CBG:  Recent Labs Lab 08/25/15 2317 08/26/15 0355 08/26/15 0734 08/26/15 1157 08/26/15 1632  GLUCAP 77 73 79 74 95   Lipid Profile: No results for input(s): CHOL, HDL, LDLCALC, TRIG, CHOLHDL, LDLDIRECT in the last 72 hours. Thyroid Function Tests:  Recent Labs  08/27/15 0345  TSH 0.659   Anemia Panel: No results for input(s): VITAMINB12, FOLATE, FERRITIN, TIBC, IRON, RETICCTPCT in the last 72 hours. Urine analysis:    Component Value Date/Time   COLORURINE YELLOW 05/07/2014 2249   APPEARANCEUR CLOUDY* 05/07/2014 2249   LABSPEC 1.018 05/07/2014 2249   PHURINE 6.5 05/07/2014 2249   GLUCOSEU NEGATIVE 05/07/2014 2249   HGBUR TRACE* 05/07/2014 2249   BILIRUBINUR NEGATIVE 05/07/2014 2249   KETONESUR NEGATIVE 05/07/2014 2249   PROTEINUR 100* 05/07/2014 2249   UROBILINOGEN 1.0 05/07/2014 2249   NITRITE NEGATIVE 05/07/2014 2249   LEUKOCYTESUR MODERATE* 05/07/2014 2249   Sepsis Labs: @LABRCNTIP (procalcitonin:4,lacticidven:4)  ) Recent Results (from the past 240 hour(s))  Culture, blood (routine x 2)      Status: None   Collection Time: 08/18/15  7:29 PM  Result Value Ref Range Status   Specimen Description BLOOD RIGHT ANTECUBITAL  Final   Special Requests BOTTLES DRAWN AEROBIC AND ANAEROBIC 5CC EACH  Final   Culture   Final    NO GROWTH 5 DAYS Performed at [facility]    Report Status 08/23/2015 FINAL  Final  Culture, blood (routine x 2)     Status: None   Collection Time: 08/18/15  7:42 PM  Result Value Ref Range Status   Specimen Description BLOOD RIGHT ANTECUBITAL  Final   Special Requests  BOTTLES DRAWN AEROBIC AND ANAEROBIC 10CC EACH  Final   Culture   Final    NO GROWTH 5 DAYS Performed at [facility]    Report Status 08/23/2015 FINAL  Final  MRSA PCR Screening     Status: None   Collection Time: 08/18/15  8:05 PM  Result Value Ref Range Status   MRSA by PCR NEGATIVE NEGATIVE Final    Comment:        The GeneXpert MRSA Assay (FDA approved for NASAL specimens only), is one component of a comprehensive MRSA colonization surveillance program. It is not intended to diagnose MRSA infection nor to guide or monitor treatment for MRSA infections.   Culture, respiratory (NON-Expectorated)     Status: None   Collection Time: 08/21/15 12:31 PM  Result Value Ref Range Status   Specimen Description TRACHEAL ASPIRATE  Final   Special Requests NONE  Final   Gram Stain   Final    WBC PRESENT,BOTH PMN AND MONONUCLEAR ABUNDANT GRAM POSITIVE COCCI IN PAIRS IN CLUSTERS CRITICAL RESULT CALLED TO, READ BACK BY AND VERIFIED WITH: S.ODON,RN 08/21/15 @2013  BY V.WILKINS CONFIRMED BY K.WOOTEN Performed at [facility]    Culture ABUNDANT STAPHYLOCOCCUS AUREUS  Final   Report Status 08/24/2015 FINAL  Final   Organism ID, Bacteria STAPHYLOCOCCUS AUREUS  Final      Susceptibility   Staphylococcus aureus - MIC*    CIPROFLOXACIN <=0.5 SENSITIVE Sensitive     ERYTHROMYCIN <=0.25 SENSITIVE Sensitive     GENTAMICIN <=0.5 SENSITIVE Sensitive     OXACILLIN <=0.25  SENSITIVE Sensitive     TETRACYCLINE <=1 SENSITIVE Sensitive     VANCOMYCIN 1 SENSITIVE Sensitive     TRIMETH/SULFA <=10 SENSITIVE Sensitive     CLINDAMYCIN <=0.25 SENSITIVE Sensitive     RIFAMPIN <=0.5 SENSITIVE Sensitive     Inducible Clindamycin NEGATIVE Sensitive     * ABUNDANT STAPHYLOCOCCUS AUREUS      Studies: No results found.  Scheduled Meds: . antiseptic oral rinse  7 mL Mouth Rinse BID  . budesonide (PULMICORT) nebulizer solution  0.5 mg Nebulization BID  .  ceFAZolin (ANCEF) IV  2 g Intravenous Q8H  . furosemide  40 mg Oral Daily  . ipratropium-albuterol  3 mL Nebulization Q6H  . metoprolol tartrate  12.5 mg Oral BID  . pantoprazole sodium  40 mg Per Tube Daily  . potassium chloride  40 mEq Oral BID  . sodium chloride  1,000 mL Intravenous Once    Continuous Infusions: . dextrose 5 % and 0.9% NaCl 20 mL/hr at 08/26/15 2000     LOS: 9 days   Time spent: 25 minutes  Hollice Espy, MD [facility] Pager [phone]  If 7PM-7AM, please contact night-coverage [email or web address] Password [facility] 08/27/2015, 12:26 PM

## 2015-08-27 NOTE — Progress Notes (Signed)
SLP Cancellation Note  Patient Details Name: Jennifer Owens MRN: [id number] DOB: 1970/08/29   Cancelled treatment:       Reason Eval/Treat Not Completed: Other (comment) Checked with RN via telephone. Pt is reportedly tolerating liquids and PO medication well. Ok to advance diet to solids as long as pt report difficulty. If pt struggles with diet, page SLP at [phone].    Dariann Huckaba, Riley Nearing 08/27/2015, 11:22 AM

## 2015-08-27 NOTE — Progress Notes (Signed)
Patient: Jennifer Owens / Admit Date: 08/17/2015 / Date of Encounter: 08/27/2015, 9:29 AM   Subjective: Sodium improved today. Creatinine stable. Net -4.5L. Weight further decreased to 314 lbs.   Objective: Telemetry NSR occasional PVCs Physical Exam: Blood pressure 113/82, pulse 80, temperature 99 F (37.2 C), temperature source Oral, resp. rate 24, height 5\' 5"  (1.651 m), weight 314 lb 2.5 oz (142.5 kg), last menstrual period 05/21/2015, SpO2 99 %. General: Well developed, well nourished obese AAF in no acute distress. Head: Normocephalic, atraumatic, sclera non-icteric, no xanthomas, nares are without discharge. Hirsute appearance Neck: JVP difficult to assess with habitus Lungs: Clear bilaterally to auscultation without wheezes, rales, or rhonchi. Breathing is unlabored. Heart: RRR S1 S2 without murmurs, rubs, or gallops.  Abdomen: Soft, non-tender, non-distended with normoactive bowel sounds. No rebound/guarding. Extremities: No clubbing or cyanosis. Trace edema superimposed on large baseline leg habitus.  Neuro: Alert and oriented X 3. Moves all extremities spontaneously. Psych:  Responds to questions appropriately with a normal affect.   Intake/Output Summary (Last 24 hours) at 08/27/15 0929 Last data filed at 08/27/15 0600  Gross per 24 hour  Intake    480 ml  Output   2000 ml  Net  -1520 ml    Inpatient Medications:  . antiseptic oral rinse  7 mL Mouth Rinse BID  . budesonide (PULMICORT) nebulizer solution  0.5 mg Nebulization BID  .  ceFAZolin (ANCEF) IV  2 g Intravenous Q8H  . furosemide  40 mg Oral Daily  . ipratropium-albuterol  3 mL Nebulization Q6H  . metoprolol tartrate  12.5 mg Oral BID  . pantoprazole sodium  40 mg Per Tube Daily  . potassium chloride  40 mEq Oral BID  . sodium chloride  1,000 mL Intravenous Once   Infusions:  . dextrose 5 % and 0.9% NaCl 20 mL/hr at 08/26/15 2000    Labs:  Recent Labs  08/26/15 0405 08/27/15 0345  NA 147* 142  K 4.3  4.3  CL 109 102  CO2 34* 35*  GLUCOSE 83 94  BUN 25* 22*  CREATININE 0.88 0.98  CALCIUM 9.0 8.8*  MG 2.2  --   PHOS 4.7*  --     Recent Labs  08/27/15 0345  AST 31  ALT 18  ALKPHOS 36*  BILITOT 1.0  PROT 7.1  ALBUMIN 3.1*    Recent Labs  08/25/15 0342 08/26/15 0405  WBC 7.1 7.1  HGB 9.9* 11.2*  HCT 31.6* 38.1  MCV 98.1 102.7*  PLT 82* 106*   No results for input(s): CKTOTAL, CKMB, TROPONINI in the last 72 hours. Invalid input(s): POCBNP No results for input(s): HGBA1C in the last 72 hours.   Radiology/Studies:  Dg Chest 1 View  08/18/2015  CLINICAL DATA:  Acute respiratory distress EXAM: CHEST 1 VIEW COMPARISON:  08/17/2015 FINDINGS: Stable cardiomegaly. Bilateral interstitial thickening. No pleural effusion or pneumothorax. No acute osseous abnormality. IMPRESSION: Cardiomegaly with pulmonary vascular congestion. No significant interval change compared with 08/17/2015. Electronically Signed   By: Elige Ko   On: 08/18/2015 19:43   Dg Chest 2 View  08/17/2015  CLINICAL DATA:  Shortness of breath for several weeks, question CHF, history asthma and smoking EXAM: CHEST  2 VIEW COMPARISON:  07/21/2015 FINDINGS: Enlargement of cardiac silhouette with pulmonary vascular congestion. Mediastinal contour stable. No definite acute infiltrate, pleural effusion or pneumothorax. Bones unremarkable. IMPRESSION: Enlargement of cardiac silhouette with pulmonary vascular congestion. No definite acute infiltrate or pulmonary edema. Electronically Signed   By: Loraine Leriche  Tyron Russell M.D.   On: 08/17/2015 15:11   Dg Abd 1 View  08/19/2015  CLINICAL DATA:  Orogastric tube placement. EXAM: ABDOMEN - 1 VIEW COMPARISON:  None. FINDINGS: Orogastric tube tip projects in the distal stomach. IMPRESSION: Well-positioned orogastric tube. Electronically Signed   By: Amie Portland M.D.   On: 08/19/2015 16:02   Dg Chest Port 1 View  08/26/2015  CLINICAL DATA:  Respiratory failure. EXAM: PORTABLE CHEST 1 VIEW  COMPARISON:  08/24/2015. FINDINGS: Endotracheal tube, NG tube, Left IJ line in stable position. Cardiomegaly with diffuse bilateral pulmonary infiltrates consistent with pulmonary edema. Pulmonary edema is increased from prior exam. No pleural effusion or pneumothorax . IMPRESSION: 1. Interim removal of endotracheal tube and NG tube. Left IJ line in stable position. 2. Cardiomegaly with diffuse bilateral pulmonary infiltrates consistent with pulmonary edema. Pulmonary edema has increased from prior exam. Electronically Signed   By: Maisie Fus  Register   On: 08/26/2015 06:49   Dg Chest Port 1 View  08/24/2015  CLINICAL DATA:  Acute respiratory failure EXAM: PORTABLE CHEST 1 VIEW COMPARISON:  08/22/2015 FINDINGS: Endotracheal tube remains in good position. NG tube enters the stomach. Left jugular central venous catheter tip in the lower SVC unchanged. No pneumothorax Improving diffuse bilateral airspace disease compatible with edema. Marked cardiac enlargement remains. No effusion. Mild bibasilar atelectasis has improved. IMPRESSION: Improved aeration of the lungs with decrease in pulmonary edema. Improvement in bibasilar atelectasis. Electronically Signed   By: Marlan Palau M.D.   On: 08/24/2015 07:10   Dg Chest Port 1 View  08/22/2015  CLINICAL DATA:  Hypoxia EXAM: PORTABLE CHEST 1 VIEW COMPARISON:  August 21, 2015 FINDINGS: Endotracheal tube tip is 3.8 cm above the carina. Central catheter tip is in the superior vena cava near the cavoatrial junction, stable. Nasogastric tube tip and side port are below the diaphragm. No pneumothorax. There is moderate interstitial and patchy alveolar edema, stable. There are small pleural effusions bilaterally with generalized cardiomegaly and pulmonary venous hypertension. No adenopathy is evident. IMPRESSION: Tube and catheter positions as described without pneumothorax. Persistent changes of congestive heart failure. No new opacity. Stable cardiac silhouette. Electronically  Signed   By: Bretta Bang III M.D.   On: 08/22/2015 07:12   Dg Chest Port 1 View  08/21/2015  CLINICAL DATA:  Acute hypoxemic respiratory failure EXAM: PORTABLE CHEST 1 VIEW COMPARISON:  Chest radiograph from one day prior. FINDINGS: Endotracheal tube tip is 4.0 cm above the carina. Enteric tube enters lower thoracic esophagus with the tip not seen on this image. Left internal jugular central venous catheter terminates at the cavoatrial junction. Stable cardiomediastinal silhouette with moderate cardiomegaly. No pneumothorax. Stable small bilateral pleural effusions. Stable diffuse hazy lung opacities. Low lung volumes and bibasilar atelectasis appears stable. IMPRESSION: 1. Support structures as described.  No pneumothorax . 2. Stable severe congestive heart failure and small bilateral pleural effusions. 3. Stable low lung volumes with bibasilar atelectasis. Electronically Signed   By: Delbert Phenix M.D.   On: 08/21/2015 07:52   Dg Chest Port 1 View  08/20/2015  CLINICAL DATA:  Respiratory failure. EXAM: PORTABLE CHEST 1 VIEW COMPARISON:  08/19/2015 FINDINGS: N tracheal tube tip is approximately 3.5 cm above the carina. Lungs show lower volumes bilaterally with potential mild interstitial edema. There remains significant cardiomegaly. There may be a component of small pleural effusions bilaterally. IMPRESSION: Lower lung volumes with potential mild interstitial edema. Stable cardiomegaly and potential bilateral small pleural effusions. Electronically Signed   By: Irish Lack  M.D.   On: 08/20/2015 08:55   Dg Chest Port 1 View  08/19/2015  CLINICAL DATA:  eval for central line placement. 45 y.o. female with a hx of HTN, Asthma, Diastolic CHF, presented to the Emergency Department several days ago complaining of worsening shortness of breath for the past 3 days. Notes shortness of breath for the past few weeks with admission on 07-22-15 due to Acute Diastolic CHF. Malignant HTN. Morbid obesity EXAM:  PORTABLE CHEST 1 VIEW COMPARISON:  08/18/2015 FINDINGS: Marked cardiomegaly, stable. No mediastinal or hilar masses. Lung volumes are low. There is mild hazy opacity at the lung bases likely combination small effusions and atelectasis. Pulmonary vascular congestion without overt pulmonary edema. No convincing pneumonia. No pneumothorax. Since prior study, endotracheal tube has been placed. Tip projects 3.5 cm above the chronic. There is a new left internal jugular central venous line with its tip at the caval atrial junction. IMPRESSION: 1. Endotracheal tube and left internal jugular central venous line are well positioned. 2. No pneumothorax. 3. Cardiomegaly, central vascular congestion, probable small effusions and lung base atelectasis. Lung base opacity mildly increased from the previous day's study. No convincing pneumonia or overt pulmonary edema. Electronically Signed   By: Amie Portland M.D.   On: 08/19/2015 11:47     Assessment and Plan  37F with morbid obesity (weight>300lb), HTN, asthma, chronic diastolic dysfunction, chronic thrombocytopenia, tobacco abuse who presented back to the hospital with SOB. She was recently hospitalized 06/2015 with acute diastolic CHF and NSVT. She had not been able to follow-up after this admission and ran out of medications which likely contributed to readmission. She was readmitted 6/29 with SOB, acute hypoxic/hypercarbic respiratory failure requiring intubation, and acute hypercarbic encephalopathy. Cardiology asked to see for new biventricular dysfunction and NSVT. 2D Echo 08/24/15: EF 35-40%, moderate RV systolic dysfunction with dilated RV (prev EF 55-60% on 07/22/15). Resp cx 6/25 staph aureus -> being treated for possible HCAP.  1. Acute combined CHF with new cardiomyopathy - Negative -4.5L with diuresis. Now on oral lasix. Hold on ACE-I/ARB or Entresto until after [facility] on Monday.   2. NSVT - lytes WNL. Now on metoprolol 12.5mg  BID.  3. Elevated troponin -  mildly elevated c/w CHF. Plan for [facility] on Monday. Orders placed.  4. AKI with hyperkalemia (Cr>2 on adm) - improved.  5. Anemia/thrombocytopenia - indices improved today. Per IM.  ?transfer to tele bed today per hospitalist service.  Chrystie Nose, MD, [facility] Attending Cardiologist [facility]

## 2015-08-28 LAB — GLUCOSE, CAPILLARY: GLUCOSE-CAPILLARY: 87 mg/dL (ref 65–99)

## 2015-08-28 LAB — BLOOD GAS, ARTERIAL
ACID-BASE EXCESS: 6.1 mmol/L — AB (ref 0.0–2.0)
Bicarbonate: 32.9 mEq/L — ABNORMAL HIGH (ref 20.0–24.0)
Drawn by: 257701
O2 CONTENT: 3 L/min
O2 Saturation: 90.1 %
PATIENT TEMPERATURE: 98.6
PCO2 ART: 61.1 mmHg — AB (ref 35.0–45.0)
PO2 ART: 64.2 mmHg — AB (ref 80.0–100.0)
TCO2: 30.2 mmol/L (ref 0–100)
pH, Arterial: 7.351 (ref 7.350–7.450)

## 2015-08-28 LAB — BASIC METABOLIC PANEL
ANION GAP: 3 — AB (ref 5–15)
BUN: 17 mg/dL (ref 6–20)
CHLORIDE: 102 mmol/L (ref 101–111)
CO2: 34 mmol/L — AB (ref 22–32)
Calcium: 8.6 mg/dL — ABNORMAL LOW (ref 8.9–10.3)
Creatinine, Ser: 0.88 mg/dL (ref 0.44–1.00)
GFR calc non Af Amer: >60 mL/min (ref >60)
Glucose, Bld: 91 mg/dL (ref 65–99)
POTASSIUM: 4.5 mmol/L (ref 3.5–5.1)
SODIUM: 139 mmol/L (ref 135–145)

## 2015-08-28 LAB — PROTIME-INR
INR: 1.37 (ref 0.00–1.49)
Prothrombin Time: 16.4 seconds — ABNORMAL HIGH (ref 11.6–15.2)

## 2015-08-28 MED ORDER — IPRATROPIUM-ALBUTEROL 0.5-2.5 (3) MG/3ML IN SOLN
3.0000 mL | Freq: Three times a day (TID) | RESPIRATORY_TRACT | Status: DC
  Administered 2015-08-28 – 2015-08-31 (×9): 3 mL via RESPIRATORY_TRACT
  Filled 2015-08-28 (×9): qty 3

## 2015-08-28 MED ORDER — SODIUM CHLORIDE 0.9% FLUSH: 3.0000 mL | INTRAVENOUS | Status: DC | PRN

## 2015-08-28 MED ORDER — SODIUM CHLORIDE 0.9% FLUSH
10.0000 mL | INTRAVENOUS | Status: DC | PRN
  Administered 2015-08-29: 10 mL
  Administered 2015-08-29: 20 mL
  Administered 2015-08-29: 10 mL
  Administered 2015-08-30: 20 mL
  Administered 2015-08-31: 10 mL
  Filled 2015-08-28 (×5): qty 40

## 2015-08-28 MED ORDER — SODIUM CHLORIDE 0.9 % IV SOLN: 250.0000 mL | INTRAVENOUS | Status: DC | PRN

## 2015-08-28 MED ORDER — SODIUM CHLORIDE 0.9% FLUSH: 3.0000 mL | Freq: Two times a day (BID) | INTRAVENOUS | Status: DC

## 2015-08-28 MED ORDER — SODIUM CHLORIDE 0.9 % IV SOLN: INTRAVENOUS | Status: DC

## 2015-08-28 NOTE — Progress Notes (Signed)
Patient: Jennifer Owens / Admit Date: 08/17/2015 / Date of Encounter: 08/28/2015, 9:05 AM   Subjective: Feels better today. Labs stable, creatinine further improved. Net -4.8L on oral lasix.  Objective: Telemetry NSR occasional PVCs Physical Exam: Blood pressure 104/52, pulse 67, temperature 98.5 F (36.9 C), temperature source Oral, resp. rate 25, height 5\' 5"  (1.651 m), weight 318 lb 2 oz (144.3 kg), last menstrual period 05/21/2015, SpO2 97 %. General: Well developed, well nourished, morbidly obese AAF in no acute distress. Head: Normocephalic, atraumatic, sclera non-icteric, no xanthomas, nares are without discharge. Hirsute appearance Neck: JVP difficult to assess with habitus Lungs: Clear bilaterally to auscultation without wheezes, rales, or rhonchi. Breathing is unlabored. Heart: RRR S1 S2 without murmurs, rubs, or gallops.  Abdomen: Soft, non-tender, non-distended with normoactive bowel sounds. No rebound/guarding. Extremities: No clubbing or cyanosis. Trace edema superimposed on large baseline leg habitus.  Neuro: Alert and oriented X 3. Moves all extremities spontaneously. Psych:  Responds to questions appropriately with a normal affect.   Intake/Output Summary (Last 24 hours) at 08/28/15 0905 Last data filed at 08/28/15 0500  Gross per 24 hour  Intake    760 ml  Output    800 ml  Net    -40 ml    Inpatient Medications:  . antiseptic oral rinse  7 mL Mouth Rinse BID  . budesonide (PULMICORT) nebulizer solution  0.5 mg Nebulization BID  .  ceFAZolin (ANCEF) IV  2 g Intravenous Q8H  . furosemide  40 mg Oral Daily  . ipratropium-albuterol  3 mL Nebulization Q6H  . metoprolol tartrate  12.5 mg Oral BID  . pantoprazole sodium  40 mg Per Tube Daily  . potassium chloride  40 mEq Oral BID  . sodium chloride  1,000 mL Intravenous Once   Infusions:  . dextrose 5 % and 0.9% NaCl 20 mL/hr at 08/26/15 2000    Labs:  Recent Labs  08/26/15 0405 08/27/15 0345 08/28/15 0400    NA 147* 142 139  K 4.3 4.3 4.5  CL 109 102 102  CO2 34* 35* 34*  GLUCOSE 83 94 91  BUN 25* 22* 17  CREATININE 0.88 0.98 0.88  CALCIUM 9.0 8.8* 8.6*  MG 2.2  --   --   PHOS 4.7*  --   --     Recent Labs  08/27/15 0345  AST 31  ALT 18  ALKPHOS 36*  BILITOT 1.0  PROT 7.1  ALBUMIN 3.1*    Recent Labs  08/26/15 0405  WBC 7.1  HGB 11.2*  HCT 38.1  MCV 102.7*  PLT 106*   No results for input(s): CKTOTAL, CKMB, TROPONINI in the last 72 hours. Invalid input(s): POCBNP No results for input(s): HGBA1C in the last 72 hours.   Radiology/Studies:  Dg Chest 1 View  08/18/2015  CLINICAL DATA:  Acute respiratory distress EXAM: CHEST 1 VIEW COMPARISON:  08/17/2015 FINDINGS: Stable cardiomegaly. Bilateral interstitial thickening. No pleural effusion or pneumothorax. No acute osseous abnormality. IMPRESSION: Cardiomegaly with pulmonary vascular congestion. No significant interval change compared with 08/17/2015. Electronically Signed   By: Elige Ko   On: 08/18/2015 19:43   Dg Chest 2 View  08/17/2015  CLINICAL DATA:  Shortness of breath for several weeks, question CHF, history asthma and smoking EXAM: CHEST  2 VIEW COMPARISON:  07/21/2015 FINDINGS: Enlargement of cardiac silhouette with pulmonary vascular congestion. Mediastinal contour stable. No definite acute infiltrate, pleural effusion or pneumothorax. Bones unremarkable. IMPRESSION: Enlargement of cardiac silhouette with pulmonary vascular congestion.  No definite acute infiltrate or pulmonary edema. Electronically Signed   By: Ulyses Southward M.D.   On: 08/17/2015 15:11   Dg Abd 1 View  08/19/2015  CLINICAL DATA:  Orogastric tube placement. EXAM: ABDOMEN - 1 VIEW COMPARISON:  None. FINDINGS: Orogastric tube tip projects in the distal stomach. IMPRESSION: Well-positioned orogastric tube. Electronically Signed   By: Amie Portland M.D.   On: 08/19/2015 16:02   Dg Chest Port 1 View  08/26/2015  CLINICAL DATA:  Respiratory failure.  EXAM: PORTABLE CHEST 1 VIEW COMPARISON:  08/24/2015. FINDINGS: Endotracheal tube, NG tube, Left IJ line in stable position. Cardiomegaly with diffuse bilateral pulmonary infiltrates consistent with pulmonary edema. Pulmonary edema is increased from prior exam. No pleural effusion or pneumothorax . IMPRESSION: 1. Interim removal of endotracheal tube and NG tube. Left IJ line in stable position. 2. Cardiomegaly with diffuse bilateral pulmonary infiltrates consistent with pulmonary edema. Pulmonary edema has increased from prior exam. Electronically Signed   By: Maisie Fus  Register   On: 08/26/2015 06:49   Dg Chest Port 1 View  08/24/2015  CLINICAL DATA:  Acute respiratory failure EXAM: PORTABLE CHEST 1 VIEW COMPARISON:  08/22/2015 FINDINGS: Endotracheal tube remains in good position. NG tube enters the stomach. Left jugular central venous catheter tip in the lower SVC unchanged. No pneumothorax Improving diffuse bilateral airspace disease compatible with edema. Marked cardiac enlargement remains. No effusion. Mild bibasilar atelectasis has improved. IMPRESSION: Improved aeration of the lungs with decrease in pulmonary edema. Improvement in bibasilar atelectasis. Electronically Signed   By: Marlan Palau M.D.   On: 08/24/2015 07:10   Dg Chest Port 1 View  08/22/2015  CLINICAL DATA:  Hypoxia EXAM: PORTABLE CHEST 1 VIEW COMPARISON:  August 21, 2015 FINDINGS: Endotracheal tube tip is 3.8 cm above the carina. Central catheter tip is in the superior vena cava near the cavoatrial junction, stable. Nasogastric tube tip and side port are below the diaphragm. No pneumothorax. There is moderate interstitial and patchy alveolar edema, stable. There are small pleural effusions bilaterally with generalized cardiomegaly and pulmonary venous hypertension. No adenopathy is evident. IMPRESSION: Tube and catheter positions as described without pneumothorax. Persistent changes of congestive heart failure. No new opacity. Stable cardiac  silhouette. Electronically Signed   By: Bretta Bang III M.D.   On: 08/22/2015 07:12   Dg Chest Port 1 View  08/21/2015  CLINICAL DATA:  Acute hypoxemic respiratory failure EXAM: PORTABLE CHEST 1 VIEW COMPARISON:  Chest radiograph from one day prior. FINDINGS: Endotracheal tube tip is 4.0 cm above the carina. Enteric tube enters lower thoracic esophagus with the tip not seen on this image. Left internal jugular central venous catheter terminates at the cavoatrial junction. Stable cardiomediastinal silhouette with moderate cardiomegaly. No pneumothorax. Stable small bilateral pleural effusions. Stable diffuse hazy lung opacities. Low lung volumes and bibasilar atelectasis appears stable. IMPRESSION: 1. Support structures as described.  No pneumothorax . 2. Stable severe congestive heart failure and small bilateral pleural effusions. 3. Stable low lung volumes with bibasilar atelectasis. Electronically Signed   By: Delbert Phenix M.D.   On: 08/21/2015 07:52   Dg Chest Port 1 View  08/20/2015  CLINICAL DATA:  Respiratory failure. EXAM: PORTABLE CHEST 1 VIEW COMPARISON:  08/19/2015 FINDINGS: N tracheal tube tip is approximately 3.5 cm above the carina. Lungs show lower volumes bilaterally with potential mild interstitial edema. There remains significant cardiomegaly. There may be a component of small pleural effusions bilaterally. IMPRESSION: Lower lung volumes with potential mild interstitial edema. Stable cardiomegaly  and potential bilateral small pleural effusions. Electronically Signed   By: Irish Lack M.D.   On: 08/20/2015 08:55   Dg Chest Port 1 View  08/19/2015  CLINICAL DATA:  eval for central line placement. 45 y.o. female with a hx of HTN, Asthma, Diastolic CHF, presented to the Emergency Department several days ago complaining of worsening shortness of breath for the past 3 days. Notes shortness of breath for the past few weeks with admission on 07-22-15 due to Acute Diastolic CHF. Malignant  HTN. Morbid obesity EXAM: PORTABLE CHEST 1 VIEW COMPARISON:  08/18/2015 FINDINGS: Marked cardiomegaly, stable. No mediastinal or hilar masses. Lung volumes are low. There is mild hazy opacity at the lung bases likely combination small effusions and atelectasis. Pulmonary vascular congestion without overt pulmonary edema. No convincing pneumonia. No pneumothorax. Since prior study, endotracheal tube has been placed. Tip projects 3.5 cm above the chronic. There is a new left internal jugular central venous line with its tip at the caval atrial junction. IMPRESSION: 1. Endotracheal tube and left internal jugular central venous line are well positioned. 2. No pneumothorax. 3. Cardiomegaly, central vascular congestion, probable small effusions and lung base atelectasis. Lung base opacity mildly increased from the previous day's study. No convincing pneumonia or overt pulmonary edema. Electronically Signed   By: Amie Portland M.D.   On: 08/19/2015 11:47     Assessment and Plan  56F with morbid obesity (weight>300lb), HTN, asthma, chronic diastolic dysfunction, chronic thrombocytopenia, tobacco abuse who presented back to the hospital with SOB. She was recently hospitalized 06/2015 with acute diastolic CHF and NSVT. She had not been able to follow-up after this admission and ran out of medications which likely contributed to readmission. She was readmitted 6/29 with SOB, acute hypoxic/hypercarbic respiratory failure requiring intubation, and acute hypercarbic encephalopathy. Cardiology asked to see for new biventricular dysfunction and NSVT. 2D Echo 08/24/15: EF 35-40%, moderate RV systolic dysfunction with dilated RV (prev EF 55-60% on 07/22/15). Resp cx 6/25 staph aureus -> being treated for possible HCAP.  1. Acute combined CHF with new cardiomyopathy - Negative -4.8L with diuresis. Now on oral lasix. Hold on ACE-I/ARB or Entresto until after [facility] on Monday.   2. NSVT - lytes WNL. Now on metoprolol 12.5mg   BID.  3. Elevated troponin - mildly elevated c/w CHF. Plan for [facility] on Monday. Orders placed.  4. AKI with hyperkalemia (Cr>2 on adm) - continues to improve- now normal.  5. Anemia/thrombocytopenia - indices improved today. Per IM.  Plan cath tomorrow. NPO p MN tonight.  Chrystie Nose, MD, [facility] Attending Cardiologist [facility]

## 2015-08-28 NOTE — Progress Notes (Addendum)
PROGRESS NOTE  Jennifer Owens ZOX:[id number] DOB: [birth year] DOA: 08/17/2015 PCP: PROVIDER NOT IN SYSTEM  HPI/Recap of past 12 hours: 45 year old female with past medical history of diastolic heart failure, chronic hypercarbic respiratory failure and cor pulmonale who was admitted on 6/21 for acute on chronic respiratory failure decompensated heart failure and also found to have MSSA pneumonia. Patient started to decline despite diuresis and ended up being intubated with critical care assuming management. Extubated on 6/29 and hospitalist assumed care on 7/1. Cardiology has been following with aggressive diuresis and plans for cardiac cath on Monday 7/3. Course complicated by episodes of nonsustained VT.  Patient continues to feel well.?Leveling off of fluid output close to 5 L.  Patient's only complaint is of bilateral foot pain on the dorsal aspect of both feet. No chest pain or shortness of breath.  Assessment/Plan: Principal Problem:  Acute on chronic systolic/Diastolic CHF exacerbation [facility]): Appreciate cardiology help. Continue Lasix, although noting volume output with little change from previous day and bicarbonate trending upward, maybe reaching endpoint of diuresis Active Problems: Morbid obesity: Patient meets criteria with BMI greater than 40  Staphylococcal pneumonia: Antibiotics completed 7/2    Thrombocytopenia (HCC)   Tobacco abuse: Counseled.   Essential hypertension: Her pressures improved with diuresis    Acute on chronic respiratory failure with hypercapnia (HCC): Getting closer to baseline. Continue diuresis.  Nonsustained ventricular tachycardia: Electrolytes stable. Patient started on metoprolol twice a day  Elevated troponin: No evidence of ACS. Felt to be secondary to CHF.  Acute kidney injury with hyperkalemia: Likely secondary to poor perfusion. With Lasix, creatinine now back to normal  Foot pain: Unusual. Does not appear to be gout related or neuropathic. She  does not describe it as numbness or tingling.? Secondary to electrolyte shifts from Lasix?   Code Status: Full code   Family Communication: Left message for family   Disposition Plan: Disposition determinant on cardiac catheterization Monday    Consultants:  Cardiology  Critical care   Procedures:  Echocardiogram done 6/28: Limited study. Left ventricle moderately dilated with decreased systolic function and EF of 35-40 percent  On ventilator 6/23-6/29  Antimicrobials:  IV vancomycin 6/25-6/28  IV Zosyn 6/25-6/28  IV Ancef 6/28-7/2  DVT prophylaxis: SCDs   Objective: Filed Vitals:   08/28/15 0400 08/28/15 0500 08/28/15 0800 08/28/15 0828  BP: 104/52  110/62   Pulse: 67  71   Temp: 99.3 F (37.4 C)  98.5 F (36.9 C)   TempSrc: Oral  Oral   Resp: 25  29   Height:      Weight:  144.3 kg (318 lb 2 oz)    SpO2: 97%  97% 97%    Intake/Output Summary (Last 24 hours) at 08/28/15 1259 Last data filed at 08/28/15 1142  Gross per 24 hour  Intake    760 ml  Output    800 ml  Net    -40 ml   Filed Weights   08/26/15 0000 08/27/15 0400 08/28/15 0500  Weight: 143.6 kg (316 lb 9.3 oz) 142.5 kg (314 lb 2.5 oz) 144.3 kg (318 lb 2 oz)    Exam:   General:  Alert and oriented 3, no acute distress   Cardiovascular: Regular rate and rhythm, S1-S2, 2/6 systolic ejection murmur   Respiratory: Decreased breath sounds throughout secondary to body habitus   Abdomen: Soft, obese, nontender, positive bowel sounds   Musculoskeletal: No clubbing or cyanosis, trace edema . Dorsal aspect of both feet non-tender to touch  Skin: No skin breaks, tears or lesions  Psychiatry: Patient is appropriate, no evidence of psychoses    Data Reviewed: CBC:  Recent Labs Lab 08/22/15 0400 08/23/15 0340 08/24/15 0330 08/25/15 0342 08/26/15 0405  WBC 8.9 8.5 7.4 7.1 7.1  HGB 10.8* 10.1* 9.9* 9.9* 11.2*  HCT 34.8* 32.2* 31.7* 31.6* 38.1  MCV 97.5 96.4 96.4 98.1 102.7*  PLT  48* 45* 63* 82* 106*   Basic Metabolic Panel:  Recent Labs Lab 08/23/15 1035  08/24/15 0330 08/25/15 0342 08/26/15 0405 08/27/15 0345 08/28/15 0400  NA  --   < > 140 143 147* 142 139  K  --   < > 3.7 3.4* 4.3 4.3 4.5  CL  --   < > 103 106 109 102 102  CO2  --   < > 31 29 34* 35* 34*  GLUCOSE  --   < > 97 102* 83 94 91  BUN  --   < > 40* 37* 25* 22* 17  CREATININE  --   < > 1.23* 1.17* 0.88 0.98 0.88  CALCIUM  --   < > 8.4* 8.8* 9.0 8.8* 8.6*  MG 1.5*  --  2.6*  --  2.2  --   --   PHOS 3.8  --   --   --  4.7*  --   --   < > = values in this interval not displayed. GFR: Estimated Creatinine Clearance: 117.1 mL/min (by C-G formula based on Cr of 0.88). Liver Function Tests:  Recent Labs Lab 08/27/15 0345  AST 31  ALT 18  ALKPHOS 36*  BILITOT 1.0  PROT 7.1  ALBUMIN 3.1*   No results for input(s): LIPASE, AMYLASE in the last 168 hours. No results for input(s): AMMONIA in the last 168 hours. Coagulation Profile: No results for input(s): INR, PROTIME in the last 168 hours. Cardiac Enzymes:  Recent Labs Lab 08/23/15 2125 08/24/15 0330 08/24/15 0904  TROPONINI 0.06* 0.05* 0.05*   BNP (last 3 results) No results for input(s): PROBNP in the last 8760 hours. HbA1C: No results for input(s): HGBA1C in the last 72 hours. CBG:  Recent Labs Lab 08/25/15 2317 08/26/15 0355 08/26/15 0734 08/26/15 1157 08/26/15 1632  GLUCAP 77 73 79 74 95   Lipid Profile:  Recent Labs  08/27/15 0345  CHOL 149  HDL 20*  LDLCALC 96  TRIG 161*  CHOLHDL 7.5   Thyroid Function Tests:  Recent Labs  08/27/15 0345  TSH 0.659   Anemia Panel: No results for input(s): VITAMINB12, FOLATE, FERRITIN, TIBC, IRON, RETICCTPCT in the last 72 hours. Urine analysis:    Component Value Date/Time   COLORURINE YELLOW 05/07/2014 2249   APPEARANCEUR CLOUDY* 05/07/2014 2249   LABSPEC 1.018 05/07/2014 2249   PHURINE 6.5 05/07/2014 2249   GLUCOSEU NEGATIVE 05/07/2014 2249   HGBUR TRACE*  05/07/2014 2249   BILIRUBINUR NEGATIVE 05/07/2014 2249   KETONESUR NEGATIVE 05/07/2014 2249   PROTEINUR 100* 05/07/2014 2249   UROBILINOGEN 1.0 05/07/2014 2249   NITRITE NEGATIVE 05/07/2014 2249   LEUKOCYTESUR MODERATE* 05/07/2014 2249   Sepsis Labs: @LABRCNTIP (procalcitonin:4,lacticidven:4)  ) Recent Results (from the past 240 hour(s))  Culture, blood (routine x 2)     Status: None   Collection Time: 08/18/15  7:29 PM  Result Value Ref Range Status   Specimen Description BLOOD RIGHT ANTECUBITAL  Final   Special Requests BOTTLES DRAWN AEROBIC AND ANAEROBIC 5CC EACH  Final   Culture   Final    NO GROWTH 5  DAYS Performed at [facility]    Report Status 08/23/2015 FINAL  Final  Culture, blood (routine x 2)     Status: None   Collection Time: 08/18/15  7:42 PM  Result Value Ref Range Status   Specimen Description BLOOD RIGHT ANTECUBITAL  Final   Special Requests BOTTLES DRAWN AEROBIC AND ANAEROBIC 10CC EACH  Final   Culture   Final    NO GROWTH 5 DAYS Performed at [facility]    Report Status 08/23/2015 FINAL  Final  MRSA PCR Screening     Status: None   Collection Time: 08/18/15  8:05 PM  Result Value Ref Range Status   MRSA by PCR NEGATIVE NEGATIVE Final    Comment:        The GeneXpert MRSA Assay (FDA approved for NASAL specimens only), is one component of a comprehensive MRSA colonization surveillance program. It is not intended to diagnose MRSA infection nor to guide or monitor treatment for MRSA infections.   Culture, respiratory (NON-Expectorated)     Status: None   Collection Time: 08/21/15 12:31 PM  Result Value Ref Range Status   Specimen Description TRACHEAL ASPIRATE  Final   Special Requests NONE  Final   Gram Stain   Final    WBC PRESENT,BOTH PMN AND MONONUCLEAR ABUNDANT GRAM POSITIVE COCCI IN PAIRS IN CLUSTERS CRITICAL RESULT CALLED TO, READ BACK BY AND VERIFIED WITH: S.ODON,RN 08/21/15 @2013  BY V.WILKINS CONFIRMED BY  K.WOOTEN Performed at [facility]    Culture ABUNDANT STAPHYLOCOCCUS AUREUS  Final   Report Status 08/24/2015 FINAL  Final   Organism ID, Bacteria STAPHYLOCOCCUS AUREUS  Final      Susceptibility   Staphylococcus aureus - MIC*    CIPROFLOXACIN <=0.5 SENSITIVE Sensitive     ERYTHROMYCIN <=0.25 SENSITIVE Sensitive     GENTAMICIN <=0.5 SENSITIVE Sensitive     OXACILLIN <=0.25 SENSITIVE Sensitive     TETRACYCLINE <=1 SENSITIVE Sensitive     VANCOMYCIN 1 SENSITIVE Sensitive     TRIMETH/SULFA <=10 SENSITIVE Sensitive     CLINDAMYCIN <=0.25 SENSITIVE Sensitive     RIFAMPIN <=0.5 SENSITIVE Sensitive     Inducible Clindamycin NEGATIVE Sensitive     * ABUNDANT STAPHYLOCOCCUS AUREUS      Studies: No results found.  Scheduled Meds: . antiseptic oral rinse  7 mL Mouth Rinse BID  . budesonide (PULMICORT) nebulizer solution  0.5 mg Nebulization BID  . furosemide  40 mg Oral Daily  . ipratropium-albuterol  3 mL Nebulization TID  . metoprolol tartrate  12.5 mg Oral BID  . pantoprazole sodium  40 mg Per Tube Daily  . potassium chloride  40 mEq Oral BID  . sodium chloride  1,000 mL Intravenous Once    Continuous Infusions: . dextrose 5 % and 0.9% NaCl 20 mL/hr at 08/26/15 2000     LOS: 10 days   Time spent: 15 minutes  Hollice Espy, MD [facility] Pager [phone]  If 7PM-7AM, please contact night-coverage [email or web address] Password [facility] 08/28/2015, 12:59 PM

## 2015-08-28 NOTE — Progress Notes (Signed)
ABG results for 08/28/15 ordered by Rito Ehrlich at approx 1300- reported to Onalee Hua, RN on 08/28/15 at approx 1654.

## 2015-08-29 ENCOUNTER — Encounter (HOSPITAL_COMMUNITY)
Admission: EM | Disposition: A | Discharge status: Home Health | Payer: Self-pay | Source: Home / Self Care | Attending: Pulmonary Disease

## 2015-08-29 DIAGNOSIS — I5043 Acute on chronic combined systolic (congestive) and diastolic (congestive) heart failure: Secondary | ICD-10-CM

## 2015-08-29 DIAGNOSIS — I472 Ventricular tachycardia: Secondary | ICD-10-CM

## 2015-08-29 DIAGNOSIS — I5022 Chronic systolic (congestive) heart failure: Secondary | ICD-10-CM

## 2015-08-29 HISTORY — PX: CARDIAC CATHETERIZATION: SHX172

## 2015-08-29 LAB — GLUCOSE, CAPILLARY
GLUCOSE-CAPILLARY: 113 mg/dL — AB (ref 65–99)
GLUCOSE-CAPILLARY: 117 mg/dL — AB (ref 65–99)
GLUCOSE-CAPILLARY: 119 mg/dL — AB (ref 65–99)
Glucose-Capillary: 105 mg/dL — ABNORMAL HIGH (ref 65–99)
Glucose-Capillary: 155 mg/dL — ABNORMAL HIGH (ref 65–99)
Glucose-Capillary: 73 mg/dL (ref 65–99)

## 2015-08-29 LAB — POCT I-STAT 3, ART BLOOD GAS (G3+)
ACID-BASE EXCESS: 6 mmol/L — AB (ref 0.0–2.0)
Bicarbonate: 32.8 mEq/L — ABNORMAL HIGH (ref 20.0–24.0)
O2 Saturation: 92 %
PCO2 ART: 57 mmHg — AB (ref 35.0–45.0)
PH ART: 7.368 (ref 7.350–7.450)
PO2 ART: 66 mmHg — AB (ref 80.0–100.0)
TCO2: 35 mmol/L (ref 0–100)

## 2015-08-29 LAB — POCT I-STAT 3, VENOUS BLOOD GAS (G3P V)
ACID-BASE EXCESS: 5 mmol/L — AB (ref 0.0–2.0)
ACID-BASE EXCESS: 8 mmol/L — AB (ref 0.0–2.0)
Acid-Base Excess: 5 mmol/L — ABNORMAL HIGH (ref 0.0–2.0)
Acid-Base Excess: 9 mmol/L — ABNORMAL HIGH (ref 0.0–2.0)
BICARBONATE: 36.2 meq/L — AB (ref 20.0–24.0)
Bicarbonate: 32.6 mEq/L — ABNORMAL HIGH (ref 20.0–24.0)
Bicarbonate: 33 mEq/L — ABNORMAL HIGH (ref 20.0–24.0)
Bicarbonate: 35.8 mEq/L — ABNORMAL HIGH (ref 20.0–24.0)
O2 SAT: 63 %
O2 Saturation: 58 %
O2 Saturation: 59 %
O2 Saturation: 61 %
TCO2: 38 mmol/L (ref 0–100)
TCO2: 34 mmol/L (ref 0–100)
TCO2: 35 mmol/L (ref 0–100)
TCO2: 38 mmol/L (ref 0–100)
pCO2, Ven: 60.5 mmHg — ABNORMAL HIGH (ref 45.0–50.0)
pCO2, Ven: 61.8 mmHg — ABNORMAL HIGH (ref 45.0–50.0)
pCO2, Ven: 63.4 mmHg — ABNORMAL HIGH (ref 45.0–50.0)
pCO2, Ven: 65.7 mmHg — ABNORMAL HIGH (ref 45.0–50.0)
pH, Ven: 7.324 — ABNORMAL HIGH (ref 7.250–7.300)
pH, Ven: 7.339 — ABNORMAL HIGH (ref 7.250–7.300)
pH, Ven: 7.349 — ABNORMAL HIGH (ref 7.250–7.300)
pH, Ven: 7.371 — ABNORMAL HIGH (ref 7.250–7.300)
pO2, Ven: 33 mmHg (ref 31.0–45.0)
pO2, Ven: 34 mmHg (ref 31.0–45.0)
pO2, Ven: 34 mmHg (ref 31.0–45.0)
pO2, Ven: 37 mmHg (ref 31.0–45.0)

## 2015-08-29 LAB — CBC
HEMATOCRIT: 36.3 % (ref 36.0–46.0)
Hemoglobin: 11.4 g/dL — ABNORMAL LOW (ref 12.0–15.0)
MCH: 29.7 pg (ref 26.0–34.0)
MCHC: 31.4 g/dL (ref 30.0–36.0)
MCV: 94.5 fL (ref 78.0–100.0)
PLATELETS: 90 10*3/uL — AB (ref 150–400)
RBC: 3.84 MIL/uL — ABNORMAL LOW (ref 3.87–5.11)
RDW: 12.8 % (ref 11.5–15.5)
WBC: 5.7 10*3/uL (ref 4.0–10.5)

## 2015-08-29 LAB — BASIC METABOLIC PANEL
Anion gap: 5 (ref 5–15)
BUN: 20 mg/dL (ref 6–20)
CHLORIDE: 100 mmol/L — AB (ref 101–111)
CO2: 34 mmol/L — ABNORMAL HIGH (ref 22–32)
CREATININE: 1.04 mg/dL — AB (ref 0.44–1.00)
Calcium: 8.6 mg/dL — ABNORMAL LOW (ref 8.9–10.3)
Glucose, Bld: 100 mg/dL — ABNORMAL HIGH (ref 65–99)
Potassium: 4.6 mmol/L (ref 3.5–5.1)
SODIUM: 139 mmol/L (ref 135–145)

## 2015-08-29 LAB — APTT: aPTT: 30 seconds (ref 24–37)

## 2015-08-29 SURGERY — RIGHT/LEFT HEART CATH AND CORONARY ANGIOGRAPHY
Anesthesia: LOCAL

## 2015-08-29 MED ORDER — HEPARIN (PORCINE) IN NACL 100-0.45 UNIT/ML-% IJ SOLN
1000.0000 [IU]/h | INTRAMUSCULAR | Status: DC
  Administered 2015-08-29: 1000 [IU]/h via INTRAVENOUS
  Filled 2015-08-29: qty 250

## 2015-08-29 MED ORDER — SODIUM CHLORIDE 0.9% FLUSH
3.0000 mL | Freq: Two times a day (BID) | INTRAVENOUS | Status: DC
  Administered 2015-08-31: 3 mL via INTRAVENOUS

## 2015-08-29 MED ORDER — HEPARIN (PORCINE) IN NACL 2-0.9 UNIT/ML-% IJ SOLN
INTRAMUSCULAR | Status: DC | PRN
  Administered 2015-08-29: 10 mL via INTRA_ARTERIAL

## 2015-08-29 MED ORDER — ASPIRIN 81 MG PO CHEW
CHEWABLE_TABLET | ORAL | Status: AC
  Filled 2015-08-29: qty 1

## 2015-08-29 MED ORDER — LIDOCAINE HCL (PF) 1 % IJ SOLN
INTRAMUSCULAR | Status: DC | PRN
  Administered 2015-08-29: 1 mL
  Administered 2015-08-29: 3 mL

## 2015-08-29 MED ORDER — IOPAMIDOL (ISOVUE-370) INJECTION 76%
INTRAVENOUS | Status: DC | PRN
  Administered 2015-08-29: 90 mL via INTRAVENOUS

## 2015-08-29 MED ORDER — IOPAMIDOL (ISOVUE-370) INJECTION 76%
INTRAVENOUS | Status: AC
  Filled 2015-08-29: qty 100

## 2015-08-29 MED ORDER — SODIUM CHLORIDE 0.9% FLUSH: 3.0000 mL | INTRAVENOUS | Status: DC | PRN

## 2015-08-29 MED ORDER — VERAPAMIL HCL 2.5 MG/ML IV SOLN
INTRAVENOUS | Status: AC
  Filled 2015-08-29: qty 2

## 2015-08-29 MED ORDER — HEPARIN SODIUM (PORCINE) 1000 UNIT/ML IJ SOLN
INTRAMUSCULAR | Status: AC
  Filled 2015-08-29: qty 1

## 2015-08-29 MED ORDER — HEPARIN SODIUM (PORCINE) 1000 UNIT/ML IJ SOLN
INTRAMUSCULAR | Status: DC | PRN
  Administered 2015-08-29: 5000 [IU] via INTRAVENOUS

## 2015-08-29 MED ORDER — OXYCODONE-ACETAMINOPHEN 5-325 MG PO TABS: 1.0000 | ORAL_TABLET | ORAL | Status: DC | PRN

## 2015-08-29 MED ORDER — SODIUM CHLORIDE 0.9 % IV SOLN: 250.0000 mL | INTRAVENOUS | Status: DC | PRN

## 2015-08-29 MED ORDER — HEPARIN (PORCINE) IN NACL 2-0.9 UNIT/ML-% IJ SOLN
INTRAMUSCULAR | Status: DC | PRN
  Administered 2015-08-29: 1500 mL

## 2015-08-29 MED ORDER — HEPARIN (PORCINE) IN NACL 2-0.9 UNIT/ML-% IJ SOLN
INTRAMUSCULAR | Status: AC
  Filled 2015-08-29: qty 500

## 2015-08-29 MED ORDER — SODIUM CHLORIDE 0.9 % WEIGHT BASED INFUSION: 1.0000 mL/kg/h | INTRAVENOUS | Status: AC

## 2015-08-29 MED ORDER — LIDOCAINE HCL (PF) 1 % IJ SOLN
INTRAMUSCULAR | Status: AC
  Filled 2015-08-29: qty 30

## 2015-08-29 MED ORDER — ASPIRIN 81 MG PO CHEW
CHEWABLE_TABLET | ORAL | Status: DC | PRN
  Administered 2015-08-29: 324 mg via ORAL

## 2015-08-29 MED ORDER — HEPARIN (PORCINE) IN NACL 2-0.9 UNIT/ML-% IJ SOLN
INTRAMUSCULAR | Status: AC
  Filled 2015-08-29: qty 1000

## 2015-08-29 MED ORDER — ONDANSETRON HCL 4 MG/2ML IJ SOLN: 4.0000 mg | Freq: Four times a day (QID) | INTRAMUSCULAR | Status: DC | PRN

## 2015-08-29 MED ORDER — ACETAMINOPHEN 325 MG PO TABS: 650.0000 mg | ORAL_TABLET | ORAL | Status: DC | PRN

## 2015-08-29 SURGICAL SUPPLY — 14 items
CATH BALLN WEDGE 5F 110CM (CATHETERS) ×1 IMPLANT
CATH INFINITI 5 FR JL3.5 (CATHETERS) ×1 IMPLANT
CATH INFINITI JR4 5F (CATHETERS) ×1 IMPLANT
DEVICE RAD COMP TR BAND LRG (VASCULAR PRODUCTS) ×1 IMPLANT
GLIDESHEATH SLEND A-KIT 6F 22G (SHEATH) ×1 IMPLANT
HOVERMATT SINGLE USE (MISCELLANEOUS) ×1 IMPLANT
KIT HEART LEFT (KITS) ×2 IMPLANT
PACK CARDIAC CATHETERIZATION (CUSTOM PROCEDURE TRAY) ×2 IMPLANT
SHEATH FAST CATH BRACH 5F 5CM (SHEATH) ×1 IMPLANT
TRANSDUCER W/STOPCOCK (MISCELLANEOUS) ×3 IMPLANT
TUBING ART PRESS 72  MALE/FEM (TUBING) ×1
TUBING ART PRESS 72 MALE/FEM (TUBING) IMPLANT
TUBING CIL FLEX 10 FLL-RA (TUBING) ×2 IMPLANT
WIRE SAFE-T 1.5MM-J .035X260CM (WIRE) ×1 IMPLANT

## 2015-08-29 NOTE — Progress Notes (Signed)
Patient Name: Jennifer Owens Date of Encounter: 08/29/2015  Principal Problem:   CHF exacerbation (HCC) Active Problems:   Hypoxia   Asthma   Thrombocytopenia (HCC)   Tobacco abuse   Essential hypertension   Respiratory failure (HCC)   Acute respiratory failure with hypercapnia (HCC)   Acute on chronic respiratory failure with hypercapnia (HCC)   Acute on chronic combined systolic and diastolic heart failure (HCC)   Morbid obesity (HCC)   Pneumonia due to Staphylococcus [facility])   Primary Cardiologist: Dr Duke Salvia  Patient Profile: 45 yo female w/ hx HTN, D-CHF, asthma, morbid obesity, chronic thrombocytopenia, tob use. D/c 05/29 after CHF admit, ran out of rx, did not get scales. EF then 55%. Pt admitted 06/21 with SOB/CHF req ETT, extubated 06/29, EF now 35-40% w/ reduced LV/RV function. NSVT on telemetry. Cards consult 06/29.  SUBJECTIVE: No chest pain, no SOB. Does not know why her eyes are red, but she was coughing very hard before she got here. No questions about cath.   OBJECTIVE Filed Vitals:   08/28/15 2036 08/29/15 0352 08/29/15 0858 08/29/15 1031  BP:  126/68    Pulse:  152 73   Temp:  97.9 F (36.6 C)    TempSrc:  Oral    Resp:  22 20   Height:      Weight:  318 lb 9 oz (144.5 kg)    SpO2: 97% 100% 99% 95%    Intake/Output Summary (Last 24 hours) at 08/29/15 1056 Last data filed at 08/29/15 0949  Gross per 24 hour  Intake   1750 ml  Output   2502 ml  Net   -752 ml   Filed Weights   08/27/15 0400 08/28/15 0500 08/29/15 0352  Weight: 314 lb 2.5 oz (142.5 kg) 318 lb 2 oz (144.3 kg) 318 lb 9 oz (144.5 kg)    PHYSICAL EXAM General: Well developed, well nourished, female in no acute distress. Head: Normocephalic, atraumatic.  Neck: Supple without bruits, JVD not seen elevated but difficult to assess 2nd body habitus. Lungs:  Resp regular and unlabored, few rales, good air exchange. Heart: RRR, S1, S2, no S3, S4, or murmur; no rub. Abdomen: Soft,  non-tender, non-distended, BS + x 4.  Extremities: No clubbing, cyanosis, trace edema.  Neuro: Alert and oriented X 3. Moves all extremities spontaneously. Psych: Normal affect.  LABS: Lab Results  Component Value Date   WBC 5.7 08/29/2015   HGB 11.4* 08/29/2015   HCT 36.3 08/29/2015   MCV 94.5 08/29/2015   PLT 90* 08/29/2015   INR:  Recent Labs  08/28/15 1815  INR 1.37   Basic Metabolic Panel:  Recent Labs  91/47/82 0400 08/29/15 0414  NA 139 139  K 4.5 4.6  CL 102 100*  CO2 34* 34*  GLUCOSE 91 100*  BUN 17 20  CREATININE 0.88 1.04*  CALCIUM 8.6* 8.6*   MAGNESIUM  Date Value Ref Range Status  08/26/2015 2.2 1.7 - 2.4 mg/dL Final   Liver Function Tests:  Recent Labs  08/27/15 0345  AST 31  ALT 18  ALKPHOS 36*  BILITOT 1.0  PROT 7.1  ALBUMIN 3.1*   BNP:  B NATRIURETIC PEPTIDE  Date/Time Value Ref Range Status  08/17/2015 04:00 PM 589.6* 0.0 - 100.0 pg/mL Final  07/21/2015 10:14 PM 325.3* 0.0 - 100.0 pg/mL Final   Fasting Lipid Panel:  Recent Labs  08/27/15 0345  CHOL 149  HDL 20*  LDLCALC 96  TRIG 956*  CHOLHDL  7.5   Thyroid Function Tests:  Recent Labs  08/27/15 0345  TSH 0.659   TELE: SR, no sig ectopy      Radiology/Studies: Dg Chest Port 1 View 08/26/2015  CLINICAL DATA:  Respiratory failure. EXAM: PORTABLE CHEST 1 VIEW COMPARISON:  08/24/2015. FINDINGS: Endotracheal tube, NG tube, Left IJ line in stable position. Cardiomegaly with diffuse bilateral pulmonary infiltrates consistent with pulmonary edema. Pulmonary edema is increased from prior exam. No pleural effusion or pneumothorax . IMPRESSION: 1. Interim removal of endotracheal tube and NG tube. Left IJ line in stable position. 2. Cardiomegaly with diffuse bilateral pulmonary infiltrates consistent with pulmonary edema. Pulmonary edema has increased from prior exam. Electronically Signed   By: Maisie Fus  Register   On: 08/26/2015 06:49    Current Medications:  . [MAR Hold]  antiseptic oral rinse  7 mL Mouth Rinse BID  . [MAR Hold] budesonide (PULMICORT) nebulizer solution  0.5 mg Nebulization BID  . [MAR Hold] furosemide  40 mg Oral Daily  . [MAR Hold] ipratropium-albuterol  3 mL Nebulization TID  . [MAR Hold] metoprolol tartrate  12.5 mg Oral BID  . [MAR Hold] potassium chloride  40 mEq Oral BID  . [MAR Hold] sodium chloride  1,000 mL Intravenous Once  . sodium chloride flush  3 mL Intravenous Q12H   . sodium chloride    . dextrose 5 % and 0.9% NaCl 20 mL/hr at 08/26/15 2000    ASSESSMENT AND PLAN: 1. Acute combined CHF with new cardiomyopathy - Negative -5.6L with diuresis. Now on oral lasix. Hold on ACE-I/ARB or Entresto until after [facility] on Monday. OK w/ IM to give ASA.   2. NSVT - K+ > 4, Mg > 2. Now on metoprolol 12.5mg  BID.  3. Elevated troponin - mildly elevated c/w CHF. Plan for [facility] today.   4. AKI with hyperkalemia (Cr>2 on adm) - per IM, continues to improve- now normal.  5. Anemia/thrombocytopenia - indices rechecked today. Per IM, but no bleeding issues, think ok to cath. OK w/ IM to give ASA.   Principal Problem:   CHF exacerbation (HCC) Active Problems:   Hypoxia   Asthma   Thrombocytopenia (HCC)   Tobacco abuse   Essential hypertension   Respiratory failure (HCC)   Acute respiratory failure with hypercapnia (HCC)   Acute on chronic respiratory failure with hypercapnia (HCC)   Acute on chronic combined systolic and diastolic heart failure (HCC)   Morbid obesity (HCC)   Pneumonia due to Staphylococcus [facility])   Signed, Barrett, Rhonda , PA-C 10:56 AM 08/29/2015  Personally seen and examined. Agree with above. Cardiac cath with NO CAD Elevated pulmonary pressure 76/30 mmHg Feels ok. Lungs clear.    - Should have sleep study as outpatient (mother has OSA)  - Tomorrow check CTA of chest to exclude PE, especially with increased PA pressure  - Pulmonary hypertension may be all secondary from Morbid obesity.   Donato Schultz,  MD

## 2015-08-29 NOTE — Progress Notes (Signed)
3cc removed from TR band at 1230. 6cc remain. Level zero.

## 2015-08-29 NOTE — Progress Notes (Signed)
SLP Cancellation Note  Patient Details Name: Jennifer Owens MRN: [id number] DOB: [birth year]   Cancelled treatment:       Reason Eval/Treat Not Completed: Other (comment). Pt NPO for procedure today. SLP checked in with pt who reports adequate tolerance of diet since upgrade Saturday. Will sign off at this time. If new concerns arise please reorder SLP evaluation.    Melina Mosteller, Riley Nearing 08/29/2015, 9:38 AM

## 2015-08-29 NOTE — Interval H&P Note (Signed)
Cath Lab Visit (complete for each Cath Lab visit)  Clinical Evaluation Leading to the Procedure:   ACS: No.  Non-ACS:    Anginal Classification: CCS III  Anti-ischemic medical therapy: Maximal Therapy (2 or more classes of medications)  Non-Invasive Test Results: No non-invasive testing performed  Prior CABG: No previous CABG      History and Physical Interval Note:  08/29/2015 10:28 AM  Jennifer Owens  has presented today for surgery, with the diagnosis of Chest Pain/ Cardiomyopathy/SOB  The various methods of treatment have been discussed with the patient and family. After consideration of risks, benefits and other options for treatment, the patient has consented to  Procedure(s): Right/Left Heart Cath and Coronary Angiography (N/A) as a surgical intervention .  The patient's history has been reviewed, patient examined, no change in status, stable for surgery.  I have reviewed the patient's chart and labs.  Questions were answered to the patient's satisfaction.     [name] III

## 2015-08-29 NOTE — Progress Notes (Signed)
ANTICOAGULATION CONSULT NOTE - Initial Consult  Pharmacy Consult for heparin Indication: ACS/STEMI   No Known Allergies  Patient Measurements: Height: 5\' 5"  (165.1 cm) Weight: (!) 318 lb 9 oz (144.5 kg) IBW/kg (Calculated) : 57 Heparin Dosing Weight: 93.2kg  Vital Signs: Temp: 98.7 F (37.1 C) (07/03 1450) Temp Source: Oral (07/03 1450) BP: 127/76 mmHg (07/03 1450) Pulse Rate: 85 (07/03 1450)  Labs:  Recent Labs  08/27/15 0345 08/28/15 0400 08/28/15 1815 08/29/15 0414 08/29/15 0928  HGB  --   --   --   --  11.4*  HCT  --   --   --   --  36.3  PLT  --   --   --   --  90*  LABPROT  --   --  16.4*  --   --   INR  --   --  1.37  --   --   CREATININE 0.98 0.88  --  1.04*  --     Estimated Creatinine Clearance: 99.2 mL/min (by C-G formula based on Cr of 1.04).   Medical History: Past Medical History  Diagnosis Date  . Sciatica   . Asthma   . Diastolic dysfunction with heart failure (HCC)   . Morbid obesity (HCC)   . Thrombocytopenia (HCC)   . Malignant hypertension   . Tobacco abuse      Assessment: 45 year old female with past medical history of diastolic heart failure, chronic hypercarbic respiratory failure and cor pulmonale who was admitted on 6/21 for acute on chronic respiratory failure decompensated heart failure and also found to have MSSA pneumonia. Cardiology completed cath today.  Pharmacy consulted to start heparin drip 8 hours after sheath removal.  Sheath removed at 11:37.  08/29/2015  Hgb WNL Plts low PT/INR WNL Scr  WNL Baseline PTT ordered  Goal of Therapy:  Heparin level 0.3-0.7 units/ml Monitor platelets by anticoagulation protocol   Plan:  No heparin bolus Start heparin drip at 1000 Units/hour at 2000 Check heparin level in 6 hours Daily heparin/ CBC Watch platelets  Arley Phenix RPh 08/29/2015, 3:31 PM Pager [phone]

## 2015-08-29 NOTE — Progress Notes (Signed)
3cc removed from TR band at 1245. 3cc remain  Level zero.

## 2015-08-29 NOTE — Progress Notes (Signed)
3cc removed from TR band at 1300. 0cc remains at this time. TR band will be left in place until 1330. Vascular access site is a level zero.

## 2015-08-29 NOTE — Progress Notes (Signed)
PROGRESS NOTE  Jennifer Owens:[id number] DOB: 09/30/1970 DOA: 08/17/2015 PCP: PROVIDER NOT IN SYSTEM  HPI/Recap of past 46 hours: 45 year old female with past medical history of diastolic heart failure, chronic hypercarbic respiratory failure and cor pulmonale who was admitted on 6/21 for acute on chronic respiratory failure decompensated heart failure and also found to have MSSA pneumonia. Patient started to decline despite diuresis and ended up being intubated with critical care assuming management. Extubated on 6/29 and hospitalist assumed care on 7/1. Course complicated by episodes of nonsustained VT.  patient went for cardiac cath on 7/3. Results pending.  Seen after cath. Doing okay. Tolerated procedure well. No complaints. Breathing easier. Assessment/Plan: Principal Problem:  Acute on chronic systolic/Diastolic CHF exacerbation [name] Appreciate cardiology help. Lasix now by mouth, although noting volume output with little change from previous day and bicarbonate trending upward, maybe reaching endpoint of diuresis Active Problems: Morbid obesity: Patient meets criteria with BMI greater than 40  Staphylococcal pneumonia: Antibiotics completed 7/2    Thrombocytopenia (HCC)   Tobacco abuse: Counseled.   Essential hypertension: Her pressures improved with diuresis    Acute on chronic respiratory failure with hypercapnia (HCC): Getting closer to baseline. Continue diuresis. We'll check CT angiogram of chest to rule out PE given concerns for PA pressure. Her pulmonary hypertension may be from her obesity. Outpatient sleep study recommended  Nonsustained ventricular tachycardia: Electrolytes stable. Patient started on metoprolol twice a day  Elevated troponin: No evidence of ACS. Felt to be secondary to CHF.  Acute kidney injury with hyperkalemia: Likely secondary to poor perfusion. With Lasix, creatinine now back to normal  Foot pain: Resolved   Code Status: Full code    Family Communication: Left message for family   Disposition Plan: Waiting for results of cardiac cath. Potential discharge in next 1-2 days   Consultants:  Cardiology  Critical care   Procedures:  Echocardiogram done 6/28: Limited study. Left ventricle moderately dilated with decreased systolic function and EF of 35-40 percent  On ventilator 6/23-6/29  Antimicrobials:  IV vancomycin 6/25-6/28  IV Zosyn 6/25-6/28  IV Ancef 6/28-7/2  DVT prophylaxis: SCDs   Objective: Filed Vitals:   08/29/15 1305 08/29/15 1320 08/29/15 1335 08/29/15 1450  BP: 160/92 123/31 132/77 127/76  Pulse: 75  80 85  Temp:    98.7 F (37.1 C)  TempSrc:    Oral  Resp: Height:      Weight:      SpO2: 100%  100% 95%    Intake/Output Summary (Last 24 hours) at 08/29/15 1628 Last data filed at 08/29/15 1546  Gross per 24 hour  Intake   1670 ml  Output   1502 ml  Net    168 ml   Filed Weights   08/27/15 0400 08/28/15 0500 08/29/15 0352  Weight: 142.5 kg (314 lb 2.5 oz) 144.3 kg (318 lb 2 oz) 144.5 kg (318 lb 9 oz)    Exam:   General:  Alert and oriented 3, no acute distress   Cardiovascular: Regular rate and rhythm, S1-S2, 2/6 systolic ejection murmur   Respiratory: Decreased breath sounds throughout secondary to body habitus   Abdomen: Soft, obese, nontender, positive bowel sounds   Musculoskeletal: No clubbing or cyanosis, trace edema .  Skin: No skin breaks, tears or lesions  Psychiatry: Patient is appropriate, no evidence of psychoses    Data Reviewed: CBC:  Recent Labs Lab 08/23/15 0340 08/24/15 0330 08/25/15 0342 08/26/15 0405 08/29/15 0928  WBC  8.5 7.4 7.1 7.1 5.7  HGB 10.1* 9.9* 9.9* 11.2* 11.4*  HCT 32.2* 31.7* 31.6* 38.1 36.3  MCV 96.4 96.4 98.1 102.7* 94.5  PLT 45* 63* 82* 106* 90*   Basic Metabolic Panel:  Recent Labs Lab 08/23/15 1035  08/24/15 0330 08/25/15 0342 08/26/15 0405 08/27/15 0345 08/28/15 0400 08/29/15 0414  NA   --   < > 140 143 147* 142 139 139  K  --   < > 3.7 3.4* 4.3 4.3 4.5 4.6  CL  --   < > 103 106 109 102 102 100*  CO2  --   < > 31 29 34* 35* 34* 34*  GLUCOSE  --   < > 97 102* 83 94 91 100*  BUN  --   < > 40* 37* 25* 22* 17 20  CREATININE  --   < > 1.23* 1.17* 0.88 0.98 0.88 1.04*  CALCIUM  --   < > 8.4* 8.8* 9.0 8.8* 8.6* 8.6*  MG 1.5*  --  2.6*  --  2.2  --   --   --   PHOS 3.8  --   --   --  4.7*  --   --   --   < > = values in this interval not displayed. GFR: Estimated Creatinine Clearance: 99.2 mL/min (by C-G formula based on Cr of 1.04). Liver Function Tests:  Recent Labs Lab 08/27/15 0345  AST 31  ALT 18  ALKPHOS 36*  BILITOT 1.0  PROT 7.1  ALBUMIN 3.1*   No results for input(s): LIPASE, AMYLASE in the last 168 hours. No results for input(s): AMMONIA in the last 168 hours. Coagulation Profile:  Recent Labs Lab 08/28/15 1815  INR 1.37   Cardiac Enzymes:  Recent Labs Lab 08/23/15 2125 08/24/15 0330 08/24/15 0904  TROPONINI 0.06* 0.05* 0.05*   BNP (last 3 results) No results for input(s): PROBNP in the last 8760 hours. HbA1C: No results for input(s): HGBA1C in the last 72 hours. CBG:  Recent Labs Lab 08/28/15 2010 08/29/15 0005 08/29/15 0349 08/29/15 0724 08/29/15 1549  GLUCAP 87 119* 117* 105* 155*   Lipid Profile:  Recent Labs  08/27/15 0345  CHOL 149  HDL 20*  LDLCALC 96  TRIG 161*  CHOLHDL 7.5   Thyroid Function Tests:  Recent Labs  08/27/15 0345  TSH 0.659   Anemia Panel: No results for input(s): VITAMINB12, FOLATE, FERRITIN, TIBC, IRON, RETICCTPCT in the last 72 hours. Urine analysis:    Component Value Date/Time   COLORURINE YELLOW 05/07/2014 2249   APPEARANCEUR CLOUDY* 05/07/2014 2249   LABSPEC 1.018 05/07/2014 2249   PHURINE 6.5 05/07/2014 2249   GLUCOSEU NEGATIVE 05/07/2014 2249   HGBUR TRACE* 05/07/2014 2249   BILIRUBINUR NEGATIVE 05/07/2014 2249   KETONESUR NEGATIVE 05/07/2014 2249   PROTEINUR 100* 05/07/2014  2249   UROBILINOGEN 1.0 05/07/2014 2249   NITRITE NEGATIVE 05/07/2014 2249   LEUKOCYTESUR MODERATE* 05/07/2014 2249   Sepsis Labs: @LABRCNTIP (procalcitonin:4,lacticidven:4)  ) Recent Results (from the past 240 hour(s))  Culture, respiratory (NON-Expectorated)     Status: None   Collection Time: 08/21/15 12:31 PM  Result Value Ref Range Status   Specimen Description TRACHEAL ASPIRATE  Final   Special Requests NONE  Final   Gram Stain   Final    WBC PRESENT,BOTH PMN AND MONONUCLEAR ABUNDANT GRAM POSITIVE COCCI IN PAIRS IN CLUSTERS CRITICAL RESULT CALLED TO, READ BACK BY AND VERIFIED WITH: S.ODON,RN 08/21/15 @2013  BY V.WILKINS CONFIRMED BY K.WOOTEN Performed at [facility]  [facility]    Culture ABUNDANT STAPHYLOCOCCUS AUREUS  Final   Report Status 08/24/2015 FINAL  Final   Organism ID, Bacteria STAPHYLOCOCCUS AUREUS  Final      Susceptibility   Staphylococcus aureus - MIC*    CIPROFLOXACIN <=0.5 SENSITIVE Sensitive     ERYTHROMYCIN <=0.25 SENSITIVE Sensitive     GENTAMICIN <=0.5 SENSITIVE Sensitive     OXACILLIN <=0.25 SENSITIVE Sensitive     TETRACYCLINE <=1 SENSITIVE Sensitive     VANCOMYCIN 1 SENSITIVE Sensitive     TRIMETH/SULFA <=10 SENSITIVE Sensitive     CLINDAMYCIN <=0.25 SENSITIVE Sensitive     RIFAMPIN <=0.5 SENSITIVE Sensitive     Inducible Clindamycin NEGATIVE Sensitive     * ABUNDANT STAPHYLOCOCCUS AUREUS      Studies: No results found.  Scheduled Meds: . antiseptic oral rinse  7 mL Mouth Rinse BID  . budesonide (PULMICORT) nebulizer solution  0.5 mg Nebulization BID  . furosemide  40 mg Oral Daily  . ipratropium-albuterol  3 mL Nebulization TID  . metoprolol tartrate  12.5 mg Oral BID  . potassium chloride  40 mEq Oral BID  . sodium chloride  1,000 mL Intravenous Once  . sodium chloride flush  3 mL Intravenous Q12H    Continuous Infusions: . dextrose 5 % and 0.9% NaCl 20 mL/hr at 08/26/15 2000  . heparin       LOS: 11 days   Time spent: 15  minutes  Hollice Espy, MD [facility] Pager [phone]  If 7PM-7AM, please contact night-coverage [email or web address] Password [facility] 08/29/2015, 4:28 PM

## 2015-08-29 NOTE — Progress Notes (Signed)
Patient transported by [facility] from Cath lab at [facility].  Patient stable at time of transfer.  Currently comfortable in bed with family at bedside.  Placed back on monitor and verified with CMT.  Will continue to monitor patient.

## 2015-08-29 NOTE — H&P (View-Only) (Signed)
Patient: Jennifer Owens / Admit Date: 08/17/2015 / Date of Encounter: 08/28/2015, 9:05 AM   Subjective: Feels better today. Labs stable, creatinine further improved. Net -4.8L on oral lasix.  Objective: Telemetry NSR occasional PVCs Physical Exam: Blood pressure 104/52, pulse 67, temperature 98.5 F (36.9 C), temperature source Oral, resp. rate 25, height 5\' 5"  (1.651 m), weight 318 lb 2 oz (144.3 kg), last menstrual period 05/21/2015, SpO2 97 %. General: Well developed, well nourished, morbidly obese AAF in no acute distress. Head: Normocephalic, atraumatic, sclera non-icteric, no xanthomas, nares are without discharge. Hirsute appearance Neck: JVP difficult to assess with habitus Lungs: Clear bilaterally to auscultation without wheezes, rales, or rhonchi. Breathing is unlabored. Heart: RRR S1 S2 without murmurs, rubs, or gallops.  Abdomen: Soft, non-tender, non-distended with normoactive bowel sounds. No rebound/guarding. Extremities: No clubbing or cyanosis. Trace edema superimposed on large baseline leg habitus.  Neuro: Alert and oriented X 3. Moves all extremities spontaneously. Psych:  Responds to questions appropriately with a normal affect.   Intake/Output Summary (Last 24 hours) at 08/28/15 0905 Last data filed at 08/28/15 0500  Gross per 24 hour  Intake    760 ml  Output    800 ml  Net    -40 ml    Inpatient Medications:  . antiseptic oral rinse  7 mL Mouth Rinse BID  . budesonide (PULMICORT) nebulizer solution  0.5 mg Nebulization BID  .  ceFAZolin (ANCEF) IV  2 g Intravenous Q8H  . furosemide  40 mg Oral Daily  . ipratropium-albuterol  3 mL Nebulization Q6H  . metoprolol tartrate  12.5 mg Oral BID  . pantoprazole sodium  40 mg Per Tube Daily  . potassium chloride  40 mEq Oral BID  . sodium chloride  1,000 mL Intravenous Once   Infusions:  . dextrose 5 % and 0.9% NaCl 20 mL/hr at 08/26/15 2000    Labs:  Recent Labs  08/26/15 0405 08/27/15 0345 08/28/15 0400    NA 147* 142 139  K 4.3 4.3 4.5  CL 109 102 102  CO2 34* 35* 34*  GLUCOSE 83 94 91  BUN 25* 22* 17  CREATININE 0.88 0.98 0.88  CALCIUM 9.0 8.8* 8.6*  MG 2.2  --   --   PHOS 4.7*  --   --     Recent Labs  08/27/15 0345  AST 31  ALT 18  ALKPHOS 36*  BILITOT 1.0  PROT 7.1  ALBUMIN 3.1*    Recent Labs  08/26/15 0405  WBC 7.1  HGB 11.2*  HCT 38.1  MCV 102.7*  PLT 106*   No results for input(s): CKTOTAL, CKMB, TROPONINI in the last 72 hours. Invalid input(s): POCBNP No results for input(s): HGBA1C in the last 72 hours.   Radiology/Studies:  Dg Chest 1 View  08/18/2015  CLINICAL DATA:  Acute respiratory distress EXAM: CHEST 1 VIEW COMPARISON:  08/17/2015 FINDINGS: Stable cardiomegaly. Bilateral interstitial thickening. No pleural effusion or pneumothorax. No acute osseous abnormality. IMPRESSION: Cardiomegaly with pulmonary vascular congestion. No significant interval change compared with 08/17/2015. Electronically Signed   By: Elige Ko   On: 08/18/2015 19:43   Dg Chest 2 View  08/17/2015  CLINICAL DATA:  Shortness of breath for several weeks, question CHF, history asthma and smoking EXAM: CHEST  2 VIEW COMPARISON:  07/21/2015 FINDINGS: Enlargement of cardiac silhouette with pulmonary vascular congestion. Mediastinal contour stable. No definite acute infiltrate, pleural effusion or pneumothorax. Bones unremarkable. IMPRESSION: Enlargement of cardiac silhouette with pulmonary vascular congestion.  No definite acute infiltrate or pulmonary edema. Electronically Signed   By: Ulyses Southward M.D.   On: 08/17/2015 15:11   Dg Abd 1 View  08/19/2015  CLINICAL DATA:  Orogastric tube placement. EXAM: ABDOMEN - 1 VIEW COMPARISON:  None. FINDINGS: Orogastric tube tip projects in the distal stomach. IMPRESSION: Well-positioned orogastric tube. Electronically Signed   By: Amie Portland M.D.   On: 08/19/2015 16:02   Dg Chest Port 1 View  08/26/2015  CLINICAL DATA:  Respiratory failure.  EXAM: PORTABLE CHEST 1 VIEW COMPARISON:  08/24/2015. FINDINGS: Endotracheal tube, NG tube, Left IJ line in stable position. Cardiomegaly with diffuse bilateral pulmonary infiltrates consistent with pulmonary edema. Pulmonary edema is increased from prior exam. No pleural effusion or pneumothorax . IMPRESSION: 1. Interim removal of endotracheal tube and NG tube. Left IJ line in stable position. 2. Cardiomegaly with diffuse bilateral pulmonary infiltrates consistent with pulmonary edema. Pulmonary edema has increased from prior exam. Electronically Signed   By: Maisie Fus  Register   On: 08/26/2015 06:49   Dg Chest Port 1 View  08/24/2015  CLINICAL DATA:  Acute respiratory failure EXAM: PORTABLE CHEST 1 VIEW COMPARISON:  08/22/2015 FINDINGS: Endotracheal tube remains in good position. NG tube enters the stomach. Left jugular central venous catheter tip in the lower SVC unchanged. No pneumothorax Improving diffuse bilateral airspace disease compatible with edema. Marked cardiac enlargement remains. No effusion. Mild bibasilar atelectasis has improved. IMPRESSION: Improved aeration of the lungs with decrease in pulmonary edema. Improvement in bibasilar atelectasis. Electronically Signed   By: Marlan Palau M.D.   On: 08/24/2015 07:10   Dg Chest Port 1 View  08/22/2015  CLINICAL DATA:  Hypoxia EXAM: PORTABLE CHEST 1 VIEW COMPARISON:  August 21, 2015 FINDINGS: Endotracheal tube tip is 3.8 cm above the carina. Central catheter tip is in the superior vena cava near the cavoatrial junction, stable. Nasogastric tube tip and side port are below the diaphragm. No pneumothorax. There is moderate interstitial and patchy alveolar edema, stable. There are small pleural effusions bilaterally with generalized cardiomegaly and pulmonary venous hypertension. No adenopathy is evident. IMPRESSION: Tube and catheter positions as described without pneumothorax. Persistent changes of congestive heart failure. No new opacity. Stable cardiac  silhouette. Electronically Signed   By: Bretta Bang III M.D.   On: 08/22/2015 07:12   Dg Chest Port 1 View  08/21/2015  CLINICAL DATA:  Acute hypoxemic respiratory failure EXAM: PORTABLE CHEST 1 VIEW COMPARISON:  Chest radiograph from one day prior. FINDINGS: Endotracheal tube tip is 4.0 cm above the carina. Enteric tube enters lower thoracic esophagus with the tip not seen on this image. Left internal jugular central venous catheter terminates at the cavoatrial junction. Stable cardiomediastinal silhouette with moderate cardiomegaly. No pneumothorax. Stable small bilateral pleural effusions. Stable diffuse hazy lung opacities. Low lung volumes and bibasilar atelectasis appears stable. IMPRESSION: 1. Support structures as described.  No pneumothorax . 2. Stable severe congestive heart failure and small bilateral pleural effusions. 3. Stable low lung volumes with bibasilar atelectasis. Electronically Signed   By: Delbert Phenix M.D.   On: 08/21/2015 07:52   Dg Chest Port 1 View  08/20/2015  CLINICAL DATA:  Respiratory failure. EXAM: PORTABLE CHEST 1 VIEW COMPARISON:  08/19/2015 FINDINGS: N tracheal tube tip is approximately 3.5 cm above the carina. Lungs show lower volumes bilaterally with potential mild interstitial edema. There remains significant cardiomegaly. There may be a component of small pleural effusions bilaterally. IMPRESSION: Lower lung volumes with potential mild interstitial edema. Stable cardiomegaly  and potential bilateral small pleural effusions. Electronically Signed   By: Irish Lack M.D.   On: 08/20/2015 08:55   Dg Chest Port 1 View  08/19/2015  CLINICAL DATA:  eval for central line placement. 45 y.o. female with a hx of HTN, Asthma, Diastolic CHF, presented to the Emergency Department several days ago complaining of worsening shortness of breath for the past 3 days. Notes shortness of breath for the past few weeks with admission on 07-22-15 due to Acute Diastolic CHF. Malignant  HTN. Morbid obesity EXAM: PORTABLE CHEST 1 VIEW COMPARISON:  08/18/2015 FINDINGS: Marked cardiomegaly, stable. No mediastinal or hilar masses. Lung volumes are low. There is mild hazy opacity at the lung bases likely combination small effusions and atelectasis. Pulmonary vascular congestion without overt pulmonary edema. No convincing pneumonia. No pneumothorax. Since prior study, endotracheal tube has been placed. Tip projects 3.5 cm above the chronic. There is a new left internal jugular central venous line with its tip at the caval atrial junction. IMPRESSION: 1. Endotracheal tube and left internal jugular central venous line are well positioned. 2. No pneumothorax. 3. Cardiomegaly, central vascular congestion, probable small effusions and lung base atelectasis. Lung base opacity mildly increased from the previous day's study. No convincing pneumonia or overt pulmonary edema. Electronically Signed   By: Amie Portland M.D.   On: 08/19/2015 11:47     Assessment and Plan  56F with morbid obesity (weight>300lb), HTN, asthma, chronic diastolic dysfunction, chronic thrombocytopenia, tobacco abuse who presented back to the hospital with SOB. She was recently hospitalized 06/2015 with acute diastolic CHF and NSVT. She had not been able to follow-up after this admission and ran out of medications which likely contributed to readmission. She was readmitted 6/29 with SOB, acute hypoxic/hypercarbic respiratory failure requiring intubation, and acute hypercarbic encephalopathy. Cardiology asked to see for new biventricular dysfunction and NSVT. 2D Echo 08/24/15: EF 35-40%, moderate RV systolic dysfunction with dilated RV (prev EF 55-60% on 07/22/15). Resp cx 6/25 staph aureus -> being treated for possible HCAP.  1. Acute combined CHF with new cardiomyopathy - Negative -4.8L with diuresis. Now on oral lasix. Hold on ACE-I/ARB or Entresto until after [facility] on Monday.   2. NSVT - lytes WNL. Now on metoprolol 12.5mg   BID.  3. Elevated troponin - mildly elevated c/w CHF. Plan for [facility] on Monday. Orders placed.  4. AKI with hyperkalemia (Cr>2 on adm) - continues to improve- now normal.  5. Anemia/thrombocytopenia - indices improved today. Per IM.  Plan cath tomorrow. NPO p MN tonight.  Chrystie Nose, MD, [facility] Attending Cardiologist [facility]

## 2015-08-29 NOTE — Progress Notes (Signed)
Nutrition Follow-up  DOCUMENTATION CODES:   Morbid obesity  INTERVENTION:  - Resume Heart Healthy diet following cath if medically feasible. - RD will continue to monitor for needs, including need for diet education, prior to d/c.  NUTRITION DIAGNOSIS:   Inadequate oral intake related to inability to eat as evidenced by NPO status. -ongoing this AM.  GOAL:   Patient will meet greater than or equal to 90% of their needs -unable to meet at this time.  MONITOR:   PO intake, Weight trends, Labs, Skin, I & O's  ASSESSMENT:   45 y.o. female with medical history significant of recent diagnosis gradfe 1 diastolic heart failure on recent hospitalization. Discharged from [facility] on May 29 and generally feeling well until about 4 days ago. She began feeling sluggish like the previous presentation. Symptoms worse when outside and in the humidity - significant difficulty catching her breath. Would feel nauseated. Symptoms ongoing the last 4 days. Today, she woke up and turned off the [facility] in the house (had also turned off her fan in her room last night). Went from bed to bathroom and was completely exhausted. With these symptoms, she decided to come back in. No chest pain/pressure other than following late-night eating a few days ago which led to indigestion. Does experience cough when she gets very hot or feels congested. +LE edema. Has not obtained a scale for daily weights - she was told to take Lasix if her weight increased more than 2 pounds/day. She did take the Lasix a couple of times and she did notice improvement, but she didn't take more (last took last night) because she was noticing some cramping of legs/fingers/abdomen. Also has been trying to fluid restrict and so concerned that she wasn't taking in enough fluid. Does note ongoing nausea following meals, and this is new for her.  7/3 Pt NPO this AM for cardiac cath and has already left for [facility] for this procedure. Per notes, diet  advanced to regular consistency solids with thin liquids per SLP recommendation. SLP note this AM states plan for sign-off. Per chart review, pt ate 100% of dinner on Heart Healthy diet last night. Weight up 2 kg since assessment 6/30 (318 lbs today and 316 lbs on 6/30).   RD will continue to monitor for needs including diet education prior to d/c if feasible. Medications reviewed; 40 mg oral Lasix/day, 40 mEq oral KCl BID. Labs reviewed; CBGs: 105-119 mg/dL this AM, Cl: 161 mmol/L, Ca: 8.6 mg/dL.  IVF: D5-NS @ 20 mL/hr (82 kcal).   6/30 - Pt continues to be NPO; RN notes state pt tolerating ice chips without issue.  - Pt reports she continues with soreness to throat following extubation yesterday AM.  - She states she had ice chips earlier this AM and tolerated them well but feels she gets "strangled" when attempting to take sips of liquids.  - Pt states that PTA she was eating 2 small meals/day on a good day.  - She states that this was mainly due to need to take medication rather than overt desire to eat.  - She indicates that weight was trending up related to fluid and that during last hospitalization she lost 20 lbs; pt feels this was due to fluid removal in addition to decreased intakes with decreased appetite.  - Pt would benefit from CHF diet education prior to d/c.  - Cardiology note from 6/29 states pt did not have a scale at home to monitor weight since last d/c.  IVF: D5-NS @ 20 mL/hr (82 kcal).   6/29 - Pt extubated shortly after RD visit this AM.  - Confirmed during rounds that pt is extubated, on [place], and OGT removed at time of extubation. - Will d/c TF and Prostat order.  - Estimated nutrition needs updated at this time.  - Per chart review, weight down 4 lbs since yesterday (326 lbs today versus 330 lbs yesterday).   Diet Order:  Diet NPO time specified Except for: Sips with Meds  Skin:  Reviewed, no issues  Last BM:  7/2  Height:   Ht Readings from Last 1  Encounters:  08/19/15 5\' 5"  (1.651 m)    Weight:   Wt Readings from Last 1 Encounters:  08/29/15 318 lb 9 oz (144.5 kg)    Ideal Body Weight:  56.82 kg (kg)  BMI:  Body mass index is 53.01 kg/(m^2).  Estimated Nutritional Needs:   Kcal:  2025-2225  Protein:  110-120 grams  Fluid:  1.8-2 L/day  EDUCATION NEEDS:   Education needs no appropriate at this time     Trenton Gammon, MS, RD, LDN Inpatient Clinical Dietitian Pager # [phone] After hours/weekend pager # [phone]

## 2015-08-29 NOTE — Progress Notes (Addendum)
TR band removed at 1330. Dressing applied. Site level zero. Awaiting [facility] for transport to [facility]. Report given to [facility] by Odis Luster.

## 2015-08-29 NOTE — Progress Notes (Addendum)
3 cc removed from TR band at 1215. Site remains level zero. 9cc'sremain.

## 2015-08-30 ENCOUNTER — Inpatient Hospital Stay (HOSPITAL_COMMUNITY): Payer: MEDICAID

## 2015-08-30 ENCOUNTER — Encounter (HOSPITAL_COMMUNITY): Payer: Self-pay | Admitting: Radiology

## 2015-08-30 DIAGNOSIS — I272 Pulmonary hypertension, unspecified: Secondary | ICD-10-CM | POA: Insufficient documentation

## 2015-08-30 DIAGNOSIS — R7989 Other specified abnormal findings of blood chemistry: Secondary | ICD-10-CM

## 2015-08-30 DIAGNOSIS — J9601 Acute respiratory failure with hypoxia: Secondary | ICD-10-CM | POA: Insufficient documentation

## 2015-08-30 LAB — CBC
HEMATOCRIT: 33.8 % — AB (ref 36.0–46.0)
HEMOGLOBIN: 10.6 g/dL — AB (ref 12.0–15.0)
MCH: 30.3 pg (ref 26.0–34.0)
MCHC: 31.4 g/dL (ref 30.0–36.0)
MCV: 96.6 fL (ref 78.0–100.0)
Platelets: 83 10*3/uL — ABNORMAL LOW (ref 150–400)
RBC: 3.5 MIL/uL — ABNORMAL LOW (ref 3.87–5.11)
RDW: 13.2 % (ref 11.5–15.5)
WBC: 5.1 10*3/uL (ref 4.0–10.5)

## 2015-08-30 LAB — BASIC METABOLIC PANEL
Anion gap: 5 (ref 5–15)
BUN: 12 mg/dL (ref 6–20)
CHLORIDE: 99 mmol/L — AB (ref 101–111)
CO2: 34 mmol/L — ABNORMAL HIGH (ref 22–32)
Calcium: 8.6 mg/dL — ABNORMAL LOW (ref 8.9–10.3)
Creatinine, Ser: 0.8 mg/dL (ref 0.44–1.00)
GFR calc non Af Amer: >60 mL/min (ref >60)
Glucose, Bld: 100 mg/dL — ABNORMAL HIGH (ref 65–99)
POTASSIUM: 4.2 mmol/L (ref 3.5–5.1)
SODIUM: 138 mmol/L (ref 135–145)

## 2015-08-30 LAB — HEPARIN LEVEL (UNFRACTIONATED)
HEPARIN UNFRACTIONATED: 0.32 [IU]/mL (ref 0.30–0.70)
Heparin Unfractionated: 0.19 IU/mL — ABNORMAL LOW (ref 0.30–0.70)

## 2015-08-30 MED ORDER — HEPARIN (PORCINE) IN NACL 100-0.45 UNIT/ML-% IJ SOLN
1600.0000 [IU]/h | INTRAMUSCULAR | Status: DC
  Administered 2015-08-30 – 2015-08-31 (×2): 1600 [IU]/h via INTRAVENOUS
  Filled 2015-08-30 (×2): qty 250

## 2015-08-30 MED ORDER — IOPAMIDOL (ISOVUE-370) INJECTION 76%
100.0000 mL | Freq: Once | INTRAVENOUS | Status: AC | PRN
  Administered 2015-08-30: 100 mL via INTRAVENOUS

## 2015-08-30 MED ORDER — HEPARIN (PORCINE) IN NACL 100-0.45 UNIT/ML-% IJ SOLN
1400.0000 [IU]/h | INTRAMUSCULAR | Status: DC
  Administered 2015-08-30: 1400 [IU]/h via INTRAVENOUS
  Filled 2015-08-30 (×2): qty 250

## 2015-08-30 NOTE — Progress Notes (Signed)
ANTICOAGULATION CONSULT NOTE -F/u Consult  Pharmacy Consult for heparin Indication: ACS/STEMI  No Known Allergies  Patient Measurements: Height: 5\' 5"  (165.1 cm) Weight: (!) 318 lb 9 oz (144.5 kg) IBW/kg (Calculated) : 57 Heparin Dosing Weight: 93.2kg  Vital Signs: Temp: 97.8 F (36.6 C) (07/04 1350) Temp Source: Oral (07/04 1350) BP: 140/85 mmHg (07/04 1350) Pulse Rate: 64 (07/04 1350)  Labs:  Recent Labs  08/28/15 0400 08/28/15 1815 08/29/15 0414 08/29/15 0928 08/29/15 1536 08/30/15 0149 08/30/15 1110  HGB  --   --   --  11.4*  --  10.6*  --   HCT  --   --   --  36.3  --  33.8*  --   PLT  --   --   --  90*  --  83*  --   APTT  --   --   --   --  30  --   --   LABPROT  --  16.4*  --   --   --   --   --   INR  --  1.37  --   --   --   --   --   HEPARINUNFRC  --   --   --   --   --  <0.10* 0.19*  CREATININE 0.88  --  1.04*  --   --   --  0.80   Estimated Creatinine Clearance: 129 mL/min (by C-G formula based on Cr of 0.8).  Medical History: Past Medical History  Diagnosis Date  . Sciatica   . Asthma   . Diastolic dysfunction with heart failure (HCC)   . Morbid obesity (HCC)   . Thrombocytopenia (HCC)   . Malignant hypertension   . Tobacco abuse    Assessment: 45 year old female with past medical history of diastolic heart failure, chronic hypercarbic respiratory failure and cor pulmonale who was admitted on 6/21 for acute on chronic respiratory failure decompensated heart failure and also found to have MSSA pneumonia and elevated troponin /possible ACS.  Pt underwent heart cath on 7/3 and Pharmacy consulted to start heparin drip 8 hours after sheath removal.   Cath negative for CAD.  Cardiology notes to check VQ scan to r/o PE and if negative, ok to stop heparin.    Today, 08/30/2015 Hgb sl low but stable and Plts low Baseline PT/INR WNL No Heparin bolus 7/3 with low platelets 2nd heparin level this am was improved, but still slightly subtherapeutic @ 0.19  (goal 0.3-0.7) Heparin infusion increased to 1600 units/hr,  3rd Heparin level in range, sample drawn by venipuncture, level 0.32 units/hr  Goal of Therapy:  Heparin level 0.3-0.7 units/ml Monitor platelets by anticoagulation protocol   Plan:  V/Q scan tomorrow Continue heparin drip at 1600 units/hr Recheck HL in am Daily heparin level/ CBC Watch platelets F/u results of VQ scan- if negative plan to discontinue Heparin  Otho Bellows PharmD Pager [phone] 08/30/2015, 9:41 PM

## 2015-08-30 NOTE — Progress Notes (Signed)
ANTICOAGULATION CONSULT NOTE -F/u Consult  Pharmacy Consult for heparin Indication: ACS/STEMI   No Known Allergies  Patient Measurements: Height: 5\' 5"  (165.1 cm) Weight: (!) 318 lb 9 oz (144.5 kg) IBW/kg (Calculated) : 57 Heparin Dosing Weight: 93.2kg  Vital Signs: Temp: 98.3 F (36.8 C) (07/04 0500) Temp Source: Oral (07/04 0500) BP: 116/70 mmHg (07/04 0500) Pulse Rate: 73 (07/04 0500)  Labs:  Recent Labs  08/28/15 0400 08/28/15 1815 08/29/15 0414 08/29/15 0928 08/29/15 1536 08/30/15 0149 08/30/15 1110  HGB  --   --   --  11.4*  --  10.6*  --   HCT  --   --   --  36.3  --  33.8*  --   PLT  --   --   --  90*  --  83*  --   APTT  --   --   --   --  30  --   --   LABPROT  --  16.4*  --   --   --   --   --   INR  --  1.37  --   --   --   --   --   HEPARINUNFRC  --   --   --   --   --  <0.10* 0.19*  CREATININE 0.88  --  1.04*  --   --   --   --     Estimated Creatinine Clearance: 99.2 mL/min (by C-G formula based on Cr of 1.04).   Medical History: Past Medical History  Diagnosis Date  . Sciatica   . Asthma   . Diastolic dysfunction with heart failure (HCC)   . Morbid obesity (HCC)   . Thrombocytopenia (HCC)   . Malignant hypertension   . Tobacco abuse      Assessment: 45 year old female with past medical history of diastolic heart failure, chronic hypercarbic respiratory failure and cor pulmonale who was admitted on 6/21 for acute on chronic respiratory failure decompensated heart failure and also found to have MSSA pneumonia and elevated troponin /possible ACS.  Pt underwent heart cath on 7/3 and Pharmacy consulted to start heparin drip 8 hours after sheath removal.   Cath negative for CAD.  Cardiology notes to check VQ scan to r/o PE and if negative, ok to stop heparin.    7/4:  Hgb sl low but stable.  Plts low PT/INR WNL Scr  WNL Baseline PT sl elevated, INR WNL  -heparin level today is increased but remains slightly subtherapeutic @ 0.19 (goal  0.3-0.7) -current heparin infusion 1400 units/hr, no bleeding or complications noted -VQ test ordered  Goal of Therapy:  Heparin level 0.3-0.7 units/ml Monitor platelets by anticoagulation protocol   Plan:  No heparin bolus due to low platelets Increase heparin drip to 1600 units/hr Recheck HL in 6 hours Daily heparin/ CBC Watch platelets F/u results of VQ scan

## 2015-08-30 NOTE — Progress Notes (Signed)
PROGRESS NOTE  Jennifer DAPPER WUJ:[id number] DOB: [birth year] DOA: 08/17/2015 PCP: PROVIDER NOT IN SYSTEM  HPI/Recap of past 66 hours: 45 year old female with past medical history of diastolic heart failure, chronic hypercarbic respiratory failure and cor pulmonale who was admitted on 6/21 for acute on chronic respiratory failure decompensated heart failure and also found to have MSSA pneumonia. Patient started to decline despite diuresis and ended up being intubated with critical care assuming management. Extubated on 6/29 and hospitalist assumed care on 7/1. Course complicated by episodes of nonsustained VT.  patient went for cardiac cath on 7/3   Which noted  normal coronary arteries, but pulmonary hypertension.  Patient for CT angiogram of chest to rule out PE today.  Today, patient doing well. Breathing comfortably. No chest pain.  Assessment/Plan: Principal Problem:  Acute on chronic systolic/Diastolic CHF exacerbation [facility]): Appreciate cardiology help. Lasix now by mouth, although noting volume output with little change from previous day and bicarbonate trending upward, maybe reaching endpoint of diuresis. On maintenance by mouth Lasix Active Problems: Morbid obesity: Patient meets criteria with BMI greater than 40  Staphylococcal pneumonia: Antibiotics completed 7/2    Thrombocytopenia (HCC)   Tobacco abuse: Counseled.   Essential hypertension: Her pressures improved with diuresis    Acute on chronic respiratory failure with hypercapnia (HCC): Getting closer to baseline. Continue diuresis. We'll check CT angiogram of chest to rule out PE given concerns for PA pressure. Her pulmonary hypertension may be from her obesity. Outpatient sleep study also recommended  Nonsustained ventricular tachycardia: Electrolytes stable. Patient started on metoprolol twice a day  Elevated troponin: No evidence of ACS. Felt to be secondary to CHF.  Acute kidney injury with hyperkalemia: Likely  secondary to poor perfusion. With Lasix, creatinine now back to normal  Foot pain: Resolved   Code Status: Full code   Family Communication: Family at the bedside  Disposition Plan: Depending on results of CT scan. Anticipate discharge likely 7/5 or 7/6   Consultants:  Cardiology  Critical care   Procedures:  Echocardiogram done 6/28: Limited study. Left ventricle moderately dilated with decreased systolic function and EF of 35-40 percent  On ventilator 6/23-6/29  Cardiac cath done 7/3: Normal coronary arteries. Pulmonary hypertension present  Antimicrobials:  IV vancomycin 6/25-6/28  IV Zosyn 6/25-6/28  IV Ancef 6/28-7/2  DVT prophylaxis: SCDs   Objective: Filed Vitals:   08/30/15 0500 08/30/15 0837 08/30/15 1349 08/30/15 1350  BP: 116/70   140/85  Pulse: 73   64  Temp: 98.3 F (36.8 C)   97.8 F (36.6 C)  TempSrc: Oral   Oral  Resp: 24   20  Height:      Weight: 144.5 kg (318 lb 9 oz)     SpO2: 98% 95% 97% 100%    Intake/Output Summary (Last 24 hours) at 08/30/15 1529 Last data filed at 08/30/15 1300  Gross per 24 hour  Intake    740 ml  Output      0 ml  Net    740 ml   Filed Weights   08/28/15 0500 08/29/15 0352 08/30/15 0500  Weight: 144.3 kg (318 lb 2 oz) 144.5 kg (318 lb 9 oz) 144.5 kg (318 lb 9 oz)    Exam: No change from previous day  General:  Alert and oriented 3, no acute distress   Cardiovascular: Regular rate and rhythm, S1-S2, 2/6 systolic ejection murmur   Respiratory: Decreased breath sounds throughout secondary to body habitus   Abdomen: Soft, obese, nontender, positive  bowel sounds   Musculoskeletal: No clubbing or cyanosis, trace edema .  Skin: No skin breaks, tears or lesions  Psychiatry: Patient is appropriate, no evidence of psychoses    Data Reviewed: CBC:  Recent Labs Lab 08/24/15 0330 08/25/15 0342 08/26/15 0405 08/29/15 0928 08/30/15 0149  WBC 7.4 7.1 7.1 5.7 5.1  HGB 9.9* 9.9* 11.2* 11.4* 10.6*    HCT 31.7* 31.6* 38.1 36.3 33.8*  MCV 96.4 98.1 102.7* 94.5 96.6  PLT 63* 82* 106* 90* 83*   Basic Metabolic Panel:  Recent Labs Lab 08/24/15 0330  08/26/15 0405 08/27/15 0345 08/28/15 0400 08/29/15 0414 08/30/15 1110  NA 140  < > 147* 142 139 139 138  K 3.7  < > 4.3 4.3 4.5 4.6 4.2  CL 103  < > 109 102 102 100* 99*  CO2 31  < > 34* 35* 34* 34* 34*  GLUCOSE 97  < > 83 94 91 100* 100*  BUN 40*  < > 25* 22* 17 20 12   CREATININE 1.23*  < > 0.88 0.98 0.88 1.04* 0.80  CALCIUM 8.4*  < > 9.0 8.8* 8.6* 8.6* 8.6*  MG 2.6*  --  2.2  --   --   --   --   PHOS  --   --  4.7*  --   --   --   --   < > = values in this interval not displayed. GFR: Estimated Creatinine Clearance: 129 mL/min (by C-G formula based on Cr of 0.8). Liver Function Tests:  Recent Labs Lab 08/27/15 0345  AST 31  ALT 18  ALKPHOS 36*  BILITOT 1.0  PROT 7.1  ALBUMIN 3.1*   No results for input(s): LIPASE, AMYLASE in the last 168 hours. No results for input(s): AMMONIA in the last 168 hours. Coagulation Profile:  Recent Labs Lab 08/28/15 1815  INR 1.37   Cardiac Enzymes:  Recent Labs Lab 08/23/15 2125 08/24/15 0330 08/24/15 0904  TROPONINI 0.06* 0.05* 0.05*   BNP (last 3 results) No results for input(s): PROBNP in the last 8760 hours. HbA1C: No results for input(s): HGBA1C in the last 72 hours. CBG:  Recent Labs Lab 08/29/15 0349 08/29/15 0724 08/29/15 1549 08/29/15 2053 08/29/15 2348  GLUCAP 117* 105* 155* 73 113*   Lipid Profile: No results for input(s): CHOL, HDL, LDLCALC, TRIG, CHOLHDL, LDLDIRECT in the last 72 hours. Thyroid Function Tests: No results for input(s): TSH, T4TOTAL, FREET4, T3FREE, THYROIDAB in the last 72 hours. Anemia Panel: No results for input(s): VITAMINB12, FOLATE, FERRITIN, TIBC, IRON, RETICCTPCT in the last 72 hours. Urine analysis:    Component Value Date/Time   COLORURINE YELLOW 05/07/2014 2249   APPEARANCEUR CLOUDY* 05/07/2014 2249   LABSPEC 1.018  05/07/2014 2249   PHURINE 6.5 05/07/2014 2249   GLUCOSEU NEGATIVE 05/07/2014 2249   HGBUR TRACE* 05/07/2014 2249   BILIRUBINUR NEGATIVE 05/07/2014 2249   KETONESUR NEGATIVE 05/07/2014 2249   PROTEINUR 100* 05/07/2014 2249   UROBILINOGEN 1.0 05/07/2014 2249   NITRITE NEGATIVE 05/07/2014 2249   LEUKOCYTESUR MODERATE* 05/07/2014 2249   Sepsis Labs: @LABRCNTIP (procalcitonin:4,lacticidven:4)  ) Recent Results (from the past 240 hour(s))  Culture, respiratory (NON-Expectorated)     Status: None   Collection Time: 08/21/15 12:31 PM  Result Value Ref Range Status   Specimen Description TRACHEAL ASPIRATE  Final   Special Requests NONE  Final   Gram Stain   Final    WBC PRESENT,BOTH PMN AND MONONUCLEAR ABUNDANT GRAM POSITIVE COCCI IN PAIRS IN CLUSTERS CRITICAL RESULT  CALLED TO, READ BACK BY AND VERIFIED WITH: S.ODON,RN 08/21/15 @2013  BY V.WILKINS CONFIRMED BY K.WOOTEN Performed at [facility]    Culture ABUNDANT STAPHYLOCOCCUS AUREUS  Final   Report Status 08/24/2015 FINAL  Final   Organism ID, Bacteria STAPHYLOCOCCUS AUREUS  Final      Susceptibility   Staphylococcus aureus - MIC*    CIPROFLOXACIN <=0.5 SENSITIVE Sensitive     ERYTHROMYCIN <=0.25 SENSITIVE Sensitive     GENTAMICIN <=0.5 SENSITIVE Sensitive     OXACILLIN <=0.25 SENSITIVE Sensitive     TETRACYCLINE <=1 SENSITIVE Sensitive     VANCOMYCIN 1 SENSITIVE Sensitive     TRIMETH/SULFA <=10 SENSITIVE Sensitive     CLINDAMYCIN <=0.25 SENSITIVE Sensitive     RIFAMPIN <=0.5 SENSITIVE Sensitive     Inducible Clindamycin NEGATIVE Sensitive     * ABUNDANT STAPHYLOCOCCUS AUREUS      Studies: No results found.  Scheduled Meds: . antiseptic oral rinse  7 mL Mouth Rinse BID  . budesonide (PULMICORT) nebulizer solution  0.5 mg Nebulization BID  . furosemide  40 mg Oral Daily  . ipratropium-albuterol  3 mL Nebulization TID  . metoprolol tartrate  12.5 mg Oral BID  . potassium chloride  40 mEq Oral BID  . sodium  chloride  1,000 mL Intravenous Once  . sodium chloride flush  3 mL Intravenous Q12H    Continuous Infusions: . dextrose 5 % and 0.9% NaCl 20 mL/hr at 08/26/15 2000  . heparin       LOS: 12 days   Time spent: 15 minutes  Hollice Espy, MD [facility] Pager [phone]  If 7PM-7AM, please contact night-coverage [email or web address] Password [email or web address] 08/30/2015, 3:29 PM

## 2015-08-30 NOTE — Progress Notes (Signed)
Patient ID: Jennifer Owens, female   DOB: 10-13-1970, 45 y.o.   MRN: [id number]     Subjective:   No complaints. SOB improving.    Objective:   Temp:  [98.3 F (36.8 C)-98.7 F (37.1 C)] 98.3 F (36.8 C) (07/04 0500) Pulse Rate:  [0-86] 73 (07/04 0500) Resp:  [0-31] 24 (07/04 0500) BP: (116-182)/(31-117) 116/70 mmHg (07/04 0500) SpO2:  [0 %-100 %] 98 % (07/04 0500) Weight:  [318 lb 9 oz (144.5 kg)] 318 lb 9 oz (144.5 kg) (07/04 0500) Last BM Date: 08/28/15  Filed Weights   08/28/15 0500 08/29/15 0352 08/30/15 0500  Weight: 318 lb 2 oz (144.3 kg) 318 lb 9 oz (144.5 kg) 318 lb 9 oz (144.5 kg)    Intake/Output Summary (Last 24 hours) at 08/30/15 0801 Last data filed at 08/29/15 2300  Gross per 24 hour  Intake    270 ml  Output    451 ml  Net   -181 ml    Telemetry: NSR  Exam:  General: NAD  HEENT: sclera clear, throat clear  Resp: CTAB  Cardiac: RRR, no m/r/g, no jvd  GI: abdomen soft, NT, ND  MSK: no LE edema  Neuro: no focal deficits  Psych: appropriate affect  Lab Results:  Basic Metabolic Panel:  Recent Labs Lab 08/23/15 1035  08/24/15 0330  08/26/15 0405 08/27/15 0345 08/28/15 0400 08/29/15 0414  NA  --   < > 140  < > 147* 142 139 139  K  --   < > 3.7  < > 4.3 4.3 4.5 4.6  CL  --   < > 103  < > 109 102 102 100*  CO2  --   < > 31  < > 34* 35* 34* 34*  GLUCOSE  --   < > 97  < > 83 94 91 100*  BUN  --   < > 40*  < > 25* 22* 17 20  CREATININE  --   < > 1.23*  < > 0.88 0.98 0.88 1.04*  CALCIUM  --   < > 8.4*  < > 9.0 8.8* 8.6* 8.6*  MG 1.5*  --  2.6*  --  2.2  --   --   --   < > = values in this interval not displayed.  Liver Function Tests:  Recent Labs Lab 08/27/15 0345  AST 31  ALT 18  ALKPHOS 36*  BILITOT 1.0  PROT 7.1  ALBUMIN 3.1*    CBC:  Recent Labs Lab 08/26/15 0405 08/29/15 0928 08/30/15 0149  WBC 7.1 5.7 5.1  HGB 11.2* 11.4* 10.6*  HCT 38.1 36.3 33.8*  MCV 102.7* 94.5 96.6  PLT 106* 90* 83*    Cardiac  Enzymes:  Recent Labs Lab 08/23/15 2125 08/24/15 0330 08/24/15 0904  TROPONINI 0.06* 0.05* 0.05*    BNP: No results for input(s): PROBNP in the last 8760 hours.  Coagulation:  Recent Labs Lab 08/28/15 1815  INR 1.37    ECG:   Medications:   Scheduled Medications: . antiseptic oral rinse  7 mL Mouth Rinse BID  . budesonide (PULMICORT) nebulizer solution  0.5 mg Nebulization BID  . furosemide  40 mg Oral Daily  . ipratropium-albuterol  3 mL Nebulization TID  . metoprolol tartrate  12.5 mg Oral BID  . potassium chloride  40 mEq Oral BID  . sodium chloride  1,000 mL Intravenous Once  . sodium chloride flush  3 mL Intravenous Q12H  Infusions: . dextrose 5 % and 0.9% NaCl 20 mL/hr at 08/26/15 2000  . heparin 1,400 Units/hr (08/30/15 0348)     PRN Medications:  sodium chloride, acetaminophen (TYLENOL) oral liquid 160 mg/5 mL, acetaminophen, albuterol, hydrALAZINE, ondansetron (ZOFRAN) IV, oxyCODONE-acetaminophen, polyvinyl alcohol, sodium chloride flush, sodium chloride flush     Assessment/Plan    1. Acute on chronicdiastolic HF - 07/2015 echo "normal LVEF", cannot evaluate diastolic function, moderately dilated RV with moderately reduced function, cannot assess PASP - 06/2015 echo LVEF 55-60%, grade I diastolic dysfunction - 08/2015 cath normal coronaries. Mean PA 42, PCWP 21, CI 2.96 - negative 5.4 liters since admission. Mild uptrend in Cr yesterday, continue oral lasix. Gradual diuresis in setting of high wedge pressure and uptrending Cr.   2. Pulmonary HTN - echo with moderate RV enlargement and moderately decreased function - RHC mean PA 42, PCWP 21. 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 Can stop heparin if negative. She will need outpatient PFTs and sleep study. Will add on ANA to AM labs. If initial testing negative can broaden workup for pulm HTN - continue diuretic. Assess for  possible home O2 needs prior to discharge.   3. NSVT - resolved on beta blocker   4. Elevated troponin - no coronary disease by cath. In setting of RV dysfunction and pulmonary HTN would rule out PE with VQ scan, if negative then ok to stop heparin. VQ in general would be better to evaluate for chronic PE in setting of pulmonary HTN vs CT PE.    Dina Rich, M.D.

## 2015-08-30 NOTE — Progress Notes (Signed)
ANTICOAGULATION CONSULT NOTE -F/u Consult  Pharmacy Consult for heparin Indication: ACS/STEMI   No Known Allergies  Patient Measurements: Height: 5\' 5"  (165.1 cm) Weight: (!) 318 lb 9 oz (144.5 kg) IBW/kg (Calculated) : 57 Heparin Dosing Weight: 93.2kg  Vital Signs: Temp: 98.7 F (37.1 C) (07/03 2056) Temp Source: Oral (07/03 2056) BP: 143/85 mmHg (07/03 2056) Pulse Rate: 72 (07/03 2056)  Labs:  Recent Labs  08/27/15 0345 08/28/15 0400 08/28/15 1815 08/29/15 0414 08/29/15 0928 08/29/15 1536 08/30/15 0149  HGB  --   --   --   --  11.4*  --  10.6*  HCT  --   --   --   --  36.3  --  33.8*  PLT  --   --   --   --  90*  --  83*  APTT  --   --   --   --   --  30  --   LABPROT  --   --  16.4*  --   --   --   --   INR  --   --  1.37  --   --   --   --   HEPARINUNFRC  --   --   --   --   --   --  <0.10*  CREATININE 0.98 0.88  --  1.04*  --   --   --     Estimated Creatinine Clearance: 99.2 mL/min (by C-G formula based on Cr of 1.04).   Medical History: Past Medical History  Diagnosis Date  . Sciatica   . Asthma   . Diastolic dysfunction with heart failure (HCC)   . Morbid obesity (HCC)   . Thrombocytopenia (HCC)   . Malignant hypertension   . Tobacco abuse      Assessment: 45 year old female with past medical history of diastolic heart failure, chronic hypercarbic respiratory failure and cor pulmonale who was admitted on 6/21 for acute on chronic respiratory failure decompensated heart failure and also found to have MSSA pneumonia. Cardiology completed cath today.  Pharmacy consulted to start heparin drip 8 hours after sheath removal.  Sheath removed at 11:37.  7/3 Hgb WNL Plts low PT/INR WNL Scr  WNL Baseline PTT ordered  Today, 7/4 0149 HL=<0.10, no infusion or bleeding problems (pt on mentrual cycle) per RN Note plts=83  Goal of Therapy:  Heparin level 0.3-0.7 units/ml Monitor platelets by anticoagulation protocol   Plan:  No heparin  bolus Increase heparin drip to 1400 units/hr Recheck HL in 6 hours Daily heparin/ CBC Watch platelets  Lorenza Evangelist 08/30/2015, 3:44 AM

## 2015-08-31 ENCOUNTER — Telehealth: Payer: Self-pay | Admitting: Cardiovascular Disease

## 2015-08-31 ENCOUNTER — Encounter (HOSPITAL_COMMUNITY): Payer: Self-pay | Admitting: Interventional Cardiology

## 2015-08-31 DIAGNOSIS — J9601 Acute respiratory failure with hypoxia: Secondary | ICD-10-CM

## 2015-08-31 DIAGNOSIS — I272 Other secondary pulmonary hypertension: Secondary | ICD-10-CM

## 2015-08-31 DIAGNOSIS — J1520 Pneumonia due to staphylococcus, unspecified: Secondary | ICD-10-CM

## 2015-08-31 LAB — CBC
HCT: 35 % — ABNORMAL LOW (ref 36.0–46.0)
Hemoglobin: 11.1 g/dL — ABNORMAL LOW (ref 12.0–15.0)
MCH: 30.5 pg (ref 26.0–34.0)
MCHC: 31.7 g/dL (ref 30.0–36.0)
MCV: 96.2 fL (ref 78.0–100.0)
PLATELETS: 103 10*3/uL — AB (ref 150–400)
RBC: 3.64 MIL/uL — AB (ref 3.87–5.11)
RDW: 13 % (ref 11.5–15.5)
WBC: 6.2 10*3/uL (ref 4.0–10.5)

## 2015-08-31 LAB — HEPARIN LEVEL (UNFRACTIONATED): Heparin Unfractionated: 0.33 IU/mL (ref 0.30–0.70)

## 2015-08-31 LAB — GLUCOSE, CAPILLARY
GLUCOSE-CAPILLARY: 89 mg/dL (ref 65–99)
Glucose-Capillary: 94 mg/dL (ref 65–99)

## 2015-08-31 LAB — ANTINUCLEAR ANTIBODIES, IFA: ANTINUCLEAR ANTIBODIES, IFA: NEGATIVE

## 2015-08-31 MED ORDER — FUROSEMIDE 40 MG PO TABS
40.0000 mg | ORAL_TABLET | Freq: Every day | ORAL | Status: DC
Start: 2015-08-31 — End: 2015-09-28

## 2015-08-31 MED ORDER — CARVEDILOL 6.25 MG PO TABS: 6.2500 mg | ORAL_TABLET | Freq: Two times a day (BID) | ORAL | Status: DC

## 2015-08-31 MED ORDER — ALBUTEROL SULFATE HFA 108 (90 BASE) MCG/ACT IN AERS: 2.0000 | INHALATION_SPRAY | Freq: Four times a day (QID) | RESPIRATORY_TRACT | Status: DC | PRN

## 2015-08-31 MED ORDER — POTASSIUM CHLORIDE CRYS ER 20 MEQ PO TBCR: 20.0000 meq | EXTENDED_RELEASE_TABLET | Freq: Two times a day (BID) | ORAL | Status: DC

## 2015-08-31 NOTE — Telephone Encounter (Signed)
TCM  Call . Appt on 09/06/15 at 9am w/ [name] at the [facility] office.   Thanks

## 2015-08-31 NOTE — Care Management Note (Addendum)
Case Management Note  Patient Details  Name: Jennifer Owens MRN: [id number] Date of Birth: [birth year]  Subjective/Objective:                  CHF Action/Plan: Discharge planning Expected Discharge Date:  09/01/15               Expected Discharge Plan:  Home/Self Care  In-House Referral:     Discharge planning Services  CM Consult  Post Acute Care Choice:    Choice offered to:  Patient  DME Arranged:  3-N-1, Oxygen DME Agency:  [place]:  NA [place] Agency:     Status of Service:  Completed, signed off  If discussed at [place] of Stay Meetings, dates discussed:    Additional Comments: CM met with pt in room to discuss discharge.  CM explained pt's home O2 and rolling walker will be supplied by [facility] if charity approved. CM has requested follow up medical care, insurance, med asst and PCP appt with [place] and pt is scheduled for September 23, 2015 at 09:00 (also on AVS).  CM has given pt [facility] letter with list of participating pharmacies for discharge medications and pt verbalized understanding of all MATCH parameters.  No other CM needs were communicated.  Dellie Catholic, RN 08/31/2015, 12:59 PM

## 2015-08-31 NOTE — Telephone Encounter (Signed)
PATIENT STILL IN [facility] 08/31/15

## 2015-08-31 NOTE — Progress Notes (Signed)
ANTICOAGULATION CONSULT NOTE -F/u Consult  Pharmacy Consult for heparin Indication: ACS/STEMI  No Known Allergies  Patient Measurements: Height: 5\' 5"  (165.1 cm) Weight: (!) 317 lb 0.3 oz (143.8 kg) IBW/kg (Calculated) : 57 Heparin Dosing Weight: 93.2kg  Vital Signs: Temp: 98.7 F (37.1 C) (07/05 0419) Temp Source: Oral (07/05 0419) BP: 134/76 mmHg (07/05 0419) Pulse Rate: 68 (07/05 0419)  Labs:  Recent Labs  08/28/15 1815 08/29/15 0414  08/29/15 0928 08/29/15 1536  08/30/15 0149 08/30/15 1110 08/30/15 2012 08/31/15 0442  HGB  --   --   < > 11.4*  --   --  10.6*  --   --  11.1*  HCT  --   --   --  36.3  --   --  33.8*  --   --  35.0*  PLT  --   --   --  90*  --   --  83*  --   --  103*  APTT  --   --   --   --  30  --   --   --   --   --   LABPROT 16.4*  --   --   --   --   --   --   --   --   --   INR 1.37  --   --   --   --   --   --   --   --   --   HEPARINUNFRC  --   --   --   --   --   < > <0.10* 0.19* 0.32 0.33  CREATININE  --  1.04*  --   --   --   --   --  0.80  --   --   < > = values in this interval not displayed. Estimated Creatinine Clearance: 128.6 mL/min (by C-G formula based on Cr of 0.8).  Assessment: 45 year old female with past medical history of diastolic heart failure, chronic hypercarbic respiratory failure and cor pulmonale who was admitted on 6/21 for acute on chronic respiratory failure decompensated heart failure and also found to have MSSA pneumonia and elevated troponin /possible ACS.  Pt underwent heart cath on 7/3 and Pharmacy consulted to start heparin drip 8 hours after sheath removal.   Cath negative for CAD.  Cardiology notes to check VQ scan to r/o PE and if negative, ok to stop heparin.    Today, 08/31/2015  Heparin level remains therapeutic  CBC: Hgb better, pltc improved - now > 100  CTA neg for PE   Goal of Therapy:  Heparin level 0.3-0.7 units/ml Monitor platelets by anticoagulation protocol   Plan:   Heparin level  therapeutic at current rate  Follow-up d/c heparin with no evidence of PE per CTA  Juliette Alcide, PharmD, BCPS.   Pager: [phone] 08/31/2015 7:35 AM

## 2015-08-31 NOTE — Progress Notes (Signed)
Patient Name: Jennifer Owens Date of Encounter: 08/31/2015  Hospital Problem List     Principal Problem:   CHF exacerbation [facility]) Active Problems:   Hypoxia   Asthma   Thrombocytopenia (HCC)   Tobacco abuse   Essential hypertension   Respiratory failure (HCC)   Acute respiratory failure with hypercapnia (HCC)   Acute on chronic respiratory failure with hypercapnia (HCC)   Acute on chronic combined systolic and diastolic heart failure (HCC)   Morbid obesity (HCC)   Pneumonia due to Staphylococcus (HCC)   Acute hypoxemic respiratory failure (HCC)   Pulmonary hypertension (HCC)    Subjective   Feeling well this morning. SOB is improving and she is feeling stronger.  Inpatient Medications    . antiseptic oral rinse  7 mL Mouth Rinse BID  . budesonide (PULMICORT) nebulizer solution  0.5 mg Nebulization BID  . furosemide  40 mg Oral Daily  . ipratropium-albuterol  3 mL Nebulization TID  . metoprolol tartrate  12.5 mg Oral BID  . potassium chloride  40 mEq Oral BID  . sodium chloride  1,000 mL Intravenous Once  . sodium chloride flush  3 mL Intravenous Q12H    Vital Signs    Filed Vitals:   08/30/15 2015 08/30/15 2220 08/31/15 0402 08/31/15 0419  BP:  124/76  134/76  Pulse:  80  68  Temp:  99 F (37.2 C)  98.7 F (37.1 C)  TempSrc:  Oral  Oral  Resp:  20  20  Height:      Weight:   317 lb 0.3 oz (143.8 kg)   SpO2: 93% 97%  100%    Intake/Output Summary (Last 24 hours) at 08/31/15 0734 Last data filed at 08/31/15 0700  Gross per 24 hour  Intake    880 ml  Output   1400 ml  Net   -520 ml   Filed Weights   08/29/15 0352 08/30/15 0500 08/31/15 0402  Weight: 318 lb 9 oz (144.5 kg) 318 lb 9 oz (144.5 kg) 317 lb 0.3 oz (143.8 kg)    Physical Exam    General: Pleasant morbidly obese female, NAD. Neuro: Alert and oriented X 3. Moves all extremities spontaneously. Psych: Normal affect. HEENT:  Redness noted to bilateral eyes, left worse.  Neck: Supple without  bruits, + JVD. Lungs:  Resp regular and unlabored, Slight expiratory wheezing. Heart: RRR no s3, s4, or murmurs. Abdomen: Soft, non-tender, non-distended, BS + x 4.  Extremities: No clubbing, cyanosis or edema. DP/PT/Radials 2+ and equal bilaterally.  Labs    CBC  Recent Labs  08/30/15 0149 08/31/15 0442  WBC 5.1 6.2  HGB 10.6* 11.1*  HCT 33.8* 35.0*  MCV 96.6 96.2  PLT 83* 103*   Basic Metabolic Panel  Recent Labs  08/29/15 0414 08/30/15 1110  NA 139 138  K 4.6 4.2  CL 100* 99*  CO2 34* 34*  GLUCOSE 100* 100*  BUN 20 12  CREATININE 1.04* 0.80  CALCIUM 8.6* 8.6*    Telemetry    SR with PVCs Rate-70s  ECG    No recent EKG  Radiology    Ct Angio Chest Pe W Or Wo Contrast  08/30/2015  CLINICAL DATA:  Shortness of breath. Chest pain. Evaluate for pulmonary embolism. EXAM: CT ANGIOGRAPHY CHEST WITH CONTRAST TECHNIQUE: Multidetector CT imaging of the chest was performed using the standard protocol during bolus administration of intravenous contrast. Multiplanar CT image reconstructions and MIPs were obtained to evaluate the vascular anatomy. CONTRAST:  100 cc Isovue 370 COMPARISON:  Chest x-ray dated 08/26/2015. FINDINGS: Mediastinum/Lymph Nodes: Evaluation of the most peripheral segmental and subsegmental pulmonary arteries is slightly limited by patient body habitus and mild patient breathing motion artifact. There is no pulmonary embolism identified within the main, lobar or central segmental pulmonary arteries bilaterally. Thoracic aorta is normal in caliber. Heart is enlarged. No pericardial effusion. No mass or enlarged lymph nodes seen within the mediastinum or perihilar regions. Lungs/Pleura: Trachea and central bronchi are unremarkable. There is minimal atelectasis versus scarring bilaterally. Lungs otherwise clear. No evidence of pneumonia. No pleural effusion or pneumothorax. Upper abdomen: Limited images of the upper abdomen are unremarkable. Musculoskeletal: Mild  degenerative change in the lower thoracic spine. No acute or suspicious osseous finding. Superficial soft tissues are unremarkable. Review of the MIP images confirms the above findings. IMPRESSION: 1. No acute findings. No pulmonary embolism seen, with mild study limitations detailed above. 2. Cardiomegaly.  No pericardial effusion. 3. No aortic aneurysm. 4. No evidence of pneumonia.  No pleural effusion.  No pneumothorax. Electronically Signed   By: Bary Richard M.D.   On: 08/30/2015 17:05    Assessment & Plan    45 yo female w/ hx HTN, D-CHF, asthma, morbid obesity, chronic thrombocytopenia, tob use. D/c 05/29 after CHF admit, ran out of rx, did not get scales. EF then 55%. Pt admitted 06/21 with SOB/CHF req ETT, extubated 06/29, EF now 35-40% w/ reduced LV/RV function. NSVT on telemetry. Cards consult 06/29.  1. Acute on chronic diastolic HF -- 06/2015 echo LVEF 55-60%, grade I diastolic dysfunction, 07/2015 echo "normal LVEF", cannot evaluate diastolic function, moderately dilated RV with moderately reduced function, cannot assess PASP -- 08/2015 cath normal coronaries. Mean PA 42, PCWP 21, CI 2.96 -- negative 5.9 liters since admission. Cr improved yesterday to 0.80, continue oral lasix. Gradual diuresis in setting of high wedge pressure. Weight stable this morning.  2. Pulmonary HTN -- echo with moderate RV enlargement and moderately decreased function -- RHC mean PA 42, PCWP 21. 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 -- continue diuretic. Assess for possible home O2 needs prior to discharge.  -- Needs outpatient PFTs and sleep study.  3. NSVT - resolved on BB, only PVCs noted on telemetry  4. Elevated troponin - no coronary disease by cath. VQ in general would be better to evaluate for chronic PE in setting of pulmonary HTN vs CT PE. Underwent CTA yesterday which appears  negative for PE, would say ok to stop heparin at this point.   Signed, Laverda Page NP-C Pager [phone]  Personally seen and examined. Agree with above.  Cath no CAD No PE or thrombus burden on CT Will change metoprolol to coreg (heart failure) 6.25 BID Lasix 40 QD oral  Weight loss/sleep study as outpatient.   OK with DC from cards perspective. Has close follow up scheduled.   Donato Schultz, MD

## 2015-08-31 NOTE — Progress Notes (Signed)
SATURATION QUALIFICATIONS: (This note is used to comply with regulatory documentation for home oxygen)  Patient Saturations on Room Air at Rest = 95%  Patient Saturations on Room Air while Ambulating = 84%  Patient Saturations on 2 Liters of oxygen while Ambulating = 92%  Please briefly explain why patient needs home oxygen: to maintain oxygen saturations above 88% during physical activity such as ambulation.  Zenovia Jarred, PT, DPT 08/31/2015 Pager: [phone]

## 2015-08-31 NOTE — Care Management Note (Signed)
Case Management Note  Patient Details  Name: Jennifer Owens MRN: [id number] Date of Birth: April 28, 1970  Subjective/Objective:                    Action/Plan:   Expected Discharge Date:  08/19/15               Expected Discharge Plan:  Home/Self Care  In-House Referral:     Discharge planning Services  CM Consult  Post Acute Care Choice:    Choice offered to:  Patient  DME Arranged:  3-N-1, Oxygen DME Agency:  [facility]  HH Arranged:  NA HH Agency:     Status of Service:  Completed, signed off  If discussed at Long Length of Stay Meetings, dates discussed:    Additional Comments:  Yves Dill, RN 08/31/2015, 1:23 PM

## 2015-08-31 NOTE — Evaluation (Signed)
Physical Therapy Evaluation Patient Details Name: Jennifer Owens MRN: [id number] DOB: [birth year] Today's Date: 08/31/2015   History of Present Illness  45 year old female with past medical history of diastolic heart failure, chronic hypercarbic respiratory failure, tobacco abuse, morbid obesity, and cor pulmonale who was admitted on 6/21 for acute on chronic respiratory failure decompensated heart failure and also found to have MSSA pneumonia  Clinical Impression  Pt admitted with above diagnosis. Pt currently with functional limitations due to the deficits listed below (see PT Problem List).  Pt will benefit from skilled PT to increase their independence and safety with mobility to allow discharge to the venue listed below.   Pt with DOE and requiring supplemental oxygen with ambulation.  Pt reports her feet are sore/painful from edema so provided RW to assist with pain control today.  Pt would benefit from HHPT however per chart, medicaid potential.       Follow Up Recommendations Home health PT    Equipment Recommendations  Rolling walker with 5" wheels    Recommendations for Other Services       Precautions / Restrictions Precautions Precaution Comments: monitor sats Restrictions Weight Bearing Restrictions: No      Mobility  Bed Mobility               General bed mobility comments: sitting EOB on arrival  Transfers Overall transfer level: Needs assistance Equipment used: Rolling walker (2 wheeled) Transfers: Sit to/from Stand Sit to Stand: Min guard         General transfer comment: verbal cues for hand placement  Ambulation/Gait Ambulation/Gait assistance: Min guard Ambulation Distance (Feet): 160 Feet Assistive device: Rolling walker (2 wheeled) Gait Pattern/deviations: Step-through pattern;Decreased stride length     General Gait Details: verbal cues for taking rest breaks due to dyspnea, pt required supplemental oxygen (see progress note)  Stairs             Wheelchair Mobility    Modified Rankin (Stroke Patients Only)       Balance                                             Pertinent Vitals/Pain Pain Assessment: No/denies pain    Home Living Family/patient expects to be discharged to:: Private residence Living Arrangements: Other relatives (sister)           Home Layout: One level Home Equipment: None      Prior Function Level of Independence: Independent         Comments: student at [employer]        Extremity/Trunk Assessment               Lower Extremity Assessment: Generalized weakness         Communication   Communication: No difficulties  Cognition Arousal/Alertness: Awake/alert Behavior During Therapy: WFL for tasks assessed/performed Overall Cognitive Status: Within Functional Limits for tasks assessed                      General Comments      Exercises        Assessment/Plan    PT Assessment Patient needs continued PT services  PT Diagnosis Difficulty walking   PT Problem List Decreased strength;Decreased activity tolerance;Cardiopulmonary status limiting activity;Decreased mobility;Decreased knowledge of use of DME  PT Treatment Interventions DME instruction;Gait  training;Functional mobility training;Patient/family education;Therapeutic activities;Therapeutic exercise   PT Goals (Current goals can be found in the Care Plan section) Acute Rehab PT Goals PT Goal Formulation: With patient Time For Goal Achievement: 09/07/15 Potential to Achieve Goals: Good    Frequency Min 3X/week   Barriers to discharge        Co-evaluation               End of Session Equipment Utilized During Treatment: Oxygen Activity Tolerance: Patient limited by fatigue Patient left: in chair;with call bell/phone within reach Nurse Communication: Mobility status         Time: 0017-4944 PT Time Calculation (min) (ACUTE  ONLY): 19 min   Charges:   PT Evaluation $PT Eval Moderate Complexity: 1 Procedure     PT G Codes:        Jennifer Owens,Jennifer Owens 08/31/2015, 12:25 PM Zenovia Jarred, PT, DPT 08/31/2015 Pager: [phone]

## 2015-08-31 NOTE — Discharge Summary (Signed)
PATIENT DETAILS Name: Jennifer Owens Age: 45 y.o. Sex: female Date of Birth: Aug 10, 1970 MRN: [id number]. Admitting Physician: Karmen Bongo, MD OIN:[facility] NOT IN SYSTEM  Admit Date: 08/17/2015 Discharge date: 08/31/2015  Recommendations for Outpatient Follow-up:  1. Please ensure follow-up with cardiology 2. Patient will need sleep study-defer to PCP 3. Please repeat CBC/BMET at next visit 4. Has severe pulmonary hypertension-further workup deferred to the outpatient setting   PRIMARY DISCHARGE DIAGNOSIS:  Principal Problem:   CHF exacerbation ([place]) Active Problems:   Hypoxia   Asthma   Thrombocytopenia (HCC)   Tobacco abuse   Essential hypertension   Respiratory failure (HCC)   Acute respiratory failure with hypercapnia (HCC)   Acute on chronic respiratory failure with hypercapnia (HCC)   Acute on chronic combined systolic and diastolic heart failure (HCC)   Morbid obesity ([place])   Pneumonia due to Staphylococcus ([place])   Acute hypoxemic respiratory failure ([place])   Pulmonary hypertension ([place])      PAST MEDICAL HISTORY: Past Medical History  Diagnosis Date  . Sciatica   . Asthma   . Diastolic dysfunction with heart failure ([place])   . Morbid obesity ([place])   . Thrombocytopenia ([place])   . Malignant hypertension   . Tobacco abuse     DISCHARGE MEDICATIONS: Current Discharge Medication List    START taking these medications   Details  carvedilol (COREG) 6.25 MG tablet Take 1 tablet (6.25 mg total) by mouth 2 (two) times daily with a meal. Qty: 60 tablet, Refills: 0    potassium chloride SA (K-DUR,KLOR-CON) 20 MEQ tablet Take 1 tablet (20 mEq total) by mouth 2 (two) times daily. Qty: 60 tablet, Refills: 0      CONTINUE these medications which have CHANGED   Details  albuterol (PROVENTIL HFA;VENTOLIN HFA) 108 (90 Base) MCG/ACT inhaler Inhale 2 puffs into the lungs every 6 (six) hours as needed for wheezing or shortness of breath. Qty: 1 Inhaler, Refills: 0      furosemide (LASIX) 40 MG tablet Take 1 tablet (40 mg total) by mouth daily. Qty: 30 tablet, Refills: 0      STOP taking these medications     acetaminophen (TYLENOL) 500 MG tablet      hydrALAZINE (APRESOLINE) 25 MG tablet      metoprolol tartrate (LOPRESSOR) 25 MG tablet         ALLERGIES:  No Known Allergies  BRIEF HPI:  See H&P, Labs, Consult and Test reports for all details in brief, patient Is a 45 year old female with history of chronic diastolic heart failure who was admitted for acute on chronic hypoxic respiratory failure secondary to acute diastolic heart failure, unfortunately hospital course was complicated by decompensation requiring mechanical ventilation.  CONSULTATIONS:   cardiology and pulmonary/intensive care  PERTINENT RADIOLOGIC STUDIES: Dg Chest 1 View  08/18/2015  CLINICAL DATA:  Acute respiratory distress EXAM: CHEST 1 VIEW COMPARISON:  08/17/2015 FINDINGS: Stable cardiomegaly. Bilateral interstitial thickening. No pleural effusion or pneumothorax. No acute osseous abnormality. IMPRESSION: Cardiomegaly with pulmonary vascular congestion. No significant interval change compared with 08/17/2015. Electronically Signed   By: Kathreen Devoid   On: 08/18/2015 19:43   Dg Chest 2 View  08/17/2015  CLINICAL DATA:  Shortness of breath for several weeks, question CHF, history asthma and smoking EXAM: CHEST  2 VIEW COMPARISON:  07/21/2015 FINDINGS: Enlargement of cardiac silhouette with pulmonary vascular congestion. Mediastinal contour stable. No definite acute infiltrate, pleural effusion or pneumothorax. Bones unremarkable. IMPRESSION: Enlargement of cardiac silhouette with pulmonary vascular  congestion. No definite acute infiltrate or pulmonary edema. Electronically Signed   By: Lavonia Dana M.D.   On: 08/17/2015 15:11   Dg Abd 1 View  08/19/2015  CLINICAL DATA:  Orogastric tube placement. EXAM: ABDOMEN - 1 VIEW COMPARISON:  None. FINDINGS: Orogastric tube tip projects in  the distal stomach. IMPRESSION: Well-positioned orogastric tube. Electronically Signed   By: Lajean Manes M.D.   On: 08/19/2015 16:02   Ct Angio Chest Pe W Or Wo Contrast  08/30/2015  CLINICAL DATA:  Shortness of breath. Chest pain. Evaluate for pulmonary embolism. EXAM: CT ANGIOGRAPHY CHEST WITH CONTRAST TECHNIQUE: Multidetector CT imaging of the chest was performed using the standard protocol during bolus administration of intravenous contrast. Multiplanar CT image reconstructions and MIPs were obtained to evaluate the vascular anatomy. CONTRAST:  100 cc Isovue 370 COMPARISON:  Chest x-ray dated 08/26/2015. FINDINGS: Mediastinum/Lymph Nodes: Evaluation of the most peripheral segmental and subsegmental pulmonary arteries is slightly limited by patient body habitus and mild patient breathing motion artifact. There is no pulmonary embolism identified within the main, lobar or central segmental pulmonary arteries bilaterally. Thoracic aorta is normal in caliber. Heart is enlarged. No pericardial effusion. No mass or enlarged lymph nodes seen within the mediastinum or perihilar regions. Lungs/Pleura: Trachea and central bronchi are unremarkable. There is minimal atelectasis versus scarring bilaterally. Lungs otherwise clear. No evidence of pneumonia. No pleural effusion or pneumothorax. Upper abdomen: Limited images of the upper abdomen are unremarkable. Musculoskeletal: Mild degenerative change in the lower thoracic spine. No acute or suspicious osseous finding. Superficial soft tissues are unremarkable. Review of the MIP images confirms the above findings. IMPRESSION: 1. No acute findings. No pulmonary embolism seen, with mild study limitations detailed above. 2. Cardiomegaly.  No pericardial effusion. 3. No aortic aneurysm. 4. No evidence of pneumonia.  No pleural effusion.  No pneumothorax. Electronically Signed   By: Franki Cabot M.D.   On: 08/30/2015 17:05   Dg Chest Port 1 View  08/26/2015  CLINICAL DATA:   Respiratory failure. EXAM: PORTABLE CHEST 1 VIEW COMPARISON:  08/24/2015. FINDINGS: Endotracheal tube, NG tube, Left IJ line in stable position. Cardiomegaly with diffuse bilateral pulmonary infiltrates consistent with pulmonary edema. Pulmonary edema is increased from prior exam. No pleural effusion or pneumothorax . IMPRESSION: 1. Interim removal of endotracheal tube and NG tube. Left IJ line in stable position. 2. Cardiomegaly with diffuse bilateral pulmonary infiltrates consistent with pulmonary edema. Pulmonary edema has increased from prior exam. Electronically Signed   By: [place]   On: 08/26/2015 06:49   Dg Chest Port 1 View  08/24/2015  CLINICAL DATA:  Acute respiratory failure EXAM: PORTABLE CHEST 1 VIEW COMPARISON:  08/22/2015 FINDINGS: Endotracheal tube remains in good position. NG tube enters the stomach. Left jugular central venous catheter tip in the lower SVC unchanged. No pneumothorax Improving diffuse bilateral airspace disease compatible with edema. Marked cardiac enlargement remains. No effusion. Mild bibasilar atelectasis has improved. IMPRESSION: Improved aeration of the lungs with decrease in pulmonary edema. Improvement in bibasilar atelectasis. Electronically Signed   By: Franchot Gallo M.D.   On: 08/24/2015 07:10   Dg Chest Port 1 View  08/22/2015  CLINICAL DATA:  Hypoxia EXAM: PORTABLE CHEST 1 VIEW COMPARISON:  August 21, 2015 FINDINGS: Endotracheal tube tip is 3.8 cm above the carina. Central catheter tip is in the superior vena cava near the cavoatrial junction, stable. Nasogastric tube tip and side port are below the diaphragm. No pneumothorax. There is moderate interstitial and patchy alveolar  edema, stable. There are small pleural effusions bilaterally with generalized cardiomegaly and pulmonary venous hypertension. No adenopathy is evident. IMPRESSION: Tube and catheter positions as described without pneumothorax. Persistent changes of congestive heart failure. No new  opacity. Stable cardiac silhouette. Electronically Signed   By: William  Woodruff III M.D.   On: 08/22/2015 07:12   Dg Chest Port 1 View  08/21/2015  CLINICAL DATA:  Acute hypoxemic respiratory failure EXAM: PORTABLE CHEST 1 VIEW COMPARISON:  Chest radiograph from one day prior. FINDINGS: Endotracheal tube tip is 4.0 cm above the carina. Enteric tube enters lower thoracic esophagus with the tip not seen on this image. Left internal jugular central venous catheter terminates at the cavoatrial junction. Stable cardiomediastinal silhouette with moderate cardiomegaly. No pneumothorax. Stable small bilateral pleural effusions. Stable diffuse hazy lung opacities. Low lung volumes and bibasilar atelectasis appears stable. IMPRESSION: 1. Support structures as described.  No pneumothorax . 2. Stable severe congestive heart failure and small bilateral pleural effusions. 3. Stable low lung volumes with bibasilar atelectasis. Electronically Signed   By: Jason A Poff M.D.   On: 08/21/2015 07:52   Dg Chest Port 1 View  08/20/2015  CLINICAL DATA:  Respiratory failure. EXAM: PORTABLE CHEST 1 VIEW COMPARISON:  08/19/2015 FINDINGS: N tracheal tube tip is approximately 3.5 cm above the carina. Lungs show lower volumes bilaterally with potential mild interstitial edema. There remains significant cardiomegaly. There may be a component of small pleural effusions bilaterally. IMPRESSION: Lower lung volumes with potential mild interstitial edema. Stable cardiomegaly and potential bilateral small pleural effusions. Electronically Signed   By: Glenn  Yamagata M.D.   On: 08/20/2015 08:55   Dg Chest Port 1 View  08/19/2015  CLINICAL DATA:  eval for central line placement. 44 y.o. female with a hx of HTN, Asthma, Diastolic CHF, presented to the Emergency Department several days ago complaining of worsening shortness of breath for the past 3 days. Notes shortness of breath for the past few weeks with admission on 07-22-15 due to Acute  Diastolic CHF. Malignant HTN. Morbid obesity EXAM: PORTABLE CHEST 1 VIEW COMPARISON:  08/18/2015 FINDINGS: Marked cardiomegaly, stable. No mediastinal or hilar masses. Lung volumes are low. There is mild hazy opacity at the lung bases likely combination small effusions and atelectasis. Pulmonary vascular congestion without overt pulmonary edema. No convincing pneumonia. No pneumothorax. Since prior study, endotracheal tube has been placed. Tip projects 3.5 cm above the chronic. There is a new left internal jugular central venous line with its tip at the caval atrial junction. IMPRESSION: 1. Endotracheal tube and left internal jugular central venous line are well positioned. 2. No pneumothorax. 3. Cardiomegaly, central vascular congestion, probable small effusions and lung base atelectasis. Lung base opacity mildly increased from the previous day's study. No convincing pneumonia or overt pulmonary edema. Electronically Signed   By: David  Ormond M.D.   On: 08/19/2015 11:47     PERTINENT LAB RESULTS: CBC:  Recent Labs  08/30/15 0149 08/31/15 0442  WBC 5.1 6.2  HGB 10.6* 11.1*  HCT 33.8* 35.0*  PLT 83* 103*   CMET CMP     Component Value Date/Time   NA 138 08/30/2015 1110   K 4.2 08/30/2015 1110   CL 99* 08/30/2015 1110   CO2 34* 08/30/2015 1110   GLUCOSE 100* 08/30/2015 1110   BUN 12 08/30/2015 1110   CREATININE 0.80 08/30/2015 1110   CALCIUM 8.6* 08/30/2015 1110   PROT 7.1 08/27/2015 0345   ALBUMIN 3.1* 08/27/2015 0345   AST 31 08/27/2015   0345   ALT 18 08/27/2015 0345   ALKPHOS 36* 08/27/2015 0345   BILITOT 1.0 08/27/2015 0345   GFRNONAA >60 08/30/2015 1110   GFRAA >60 08/30/2015 1110    GFR Estimated Creatinine Clearance: 128.6 mL/min (by C-G formula based on Cr of 0.8). No results for input(s): LIPASE, AMYLASE in the last 72 hours. No results for input(s): CKTOTAL, CKMB, CKMBINDEX, TROPONINI in the last 72 hours. Invalid input(s): POCBNP No results for input(s): DDIMER in  the last 72 hours. No results for input(s): HGBA1C in the last 72 hours. No results for input(s): CHOL, HDL, LDLCALC, TRIG, CHOLHDL, LDLDIRECT in the last 72 hours. No results for input(s): TSH, T4TOTAL, T3FREE, THYROIDAB in the last 72 hours.  Invalid input(s): FREET3 No results for input(s): VITAMINB12, FOLATE, FERRITIN, TIBC, IRON, RETICCTPCT in the last 72 hours. Coags:  Recent Labs  08/28/15 1815  INR 1.37   Microbiology: No results found for this or any previous visit (from the past 240 hour(s)).   BRIEF HOSPITAL COURSE:  Brief summary: 43-year-old female past medical history of chronic diastolic heart failure, obesity who was admitted on 6/21 for acute on chronic hypoxic respiratory failure secondary to decompensated diastolic heart failure. Hospital course was complicated by worsening respiratory failure, requiring intubation and mechanical ventilation. She was subsequently stabilized, and transferred to the hospitalist service. She rapidly improved, by day of discharge she felt that she was back to her usual baseline. She is going to require oxygen on discharge. Please see below for further details  Hospital course by problem list: Acute and chronic hypoxemic and hypercarbic respiratory failure: Secondary to acute diastolic heart failure and MSSA pneumonia. Likely OSA contributing as well. During the early part of her hospital stay, she worsened requiring intubation and mechanical ventilation. She was managed by pulmonary critical care, given diuretics and empiric antibiotics. Upon stability, she was transferred back to the [facility] hospitalist service. She is doing much better, by day of discharge it was felt that patient had met maximum benefit from a hospital stay-she had already completed a course of IV antibiotics for MSSA pneumonia, and was felt to be compensative in terms of heart failure and on oral diuretics for the past few days. She does qualify for home O2, this has been  arranged with case management. Outpatient follow-up with cardiology and PCP have also been arranged .  Acute on chronic diastolic heart failure: Likely contributing to above. Managed with diuretics, cardiology followed closely throughout this hospital stay. By day of discharge easily tolerating oral diuretics with stable weight. Thought to be euvolemic. No further recommendations from cardiology, cardiology has arranged for outpatient follow-up. below.  Acute kidney injury: Thought to be secondary to acute CHF, improved with supportive care and diuretics.  MSSA pneumonia: Completed a course of antibiotics, and is no longer in any antibiotics. She remained stable, afebrile and clinically improved.  Elevated troponins: Thought to be secondary to demand ischemia from respiratory failure and CHF. Patient underwent LHC, which was negative for C. Difficile.  Severe pulmonary hypertension: She also underwent a right heart catheterization which showed a PA pressure of 76/30 mmHg. CT scan chest to rule out pulmonary embolism was negative. ENA was negative. She does qualify for home oxygen, this will be arranged on discharge. She will likely need a outpatient sleep study for further evaluation. She will need very close monitoring in the outpatient setting, and perhaps referral to pulmonology at some point  Morbid obesity: Counseled regarding importance of weight loss.  TODAY-DAY   OF DISCHARGE:  Subjective:   Jennifer Owens today has no headache,no chest abdominal pain,no new weakness tingling or numbness, feels much better wants to go home today.   Objective:   Blood pressure 134/76, pulse 68, temperature 98.7 F (37.1 C), temperature source Oral, resp. rate 20, height 5' 5" (1.651 m), weight 143.8 kg (317 lb 0.3 oz), last menstrual period 05/21/2015, SpO2 97 %.  Intake/Output Summary (Last 24 hours) at 08/31/15 1304 Last data filed at 08/31/15 1237  Gross per 24 hour  Intake    400 ml  Output   1700  ml  Net  -1300 ml   Filed Weights   08/29/15 0352 08/30/15 0500 08/31/15 0402  Weight: 144.5 kg (318 lb 9 oz) 144.5 kg (318 lb 9 oz) 143.8 kg (317 lb 0.3 oz)    Exam Awake Alert, Oriented *3, No new F.N deficits, Normal affect [place].AT,PERRAL Supple Neck,No JVD, No cervical lymphadenopathy appriciated.  Symmetrical Chest wall movement, Good air movement bilaterally, CTAB RRR,No Gallops,Rubs or new Murmurs, No Parasternal Heave +ve B.Sounds, Abd Soft, Non tender, No organomegaly appriciated, No rebound -guarding or rigidity. No Cyanosis, Clubbing or edema, No new Rash or bruise  DISCHARGE CONDITION: Stable  DISPOSITION: Home with home health service  DISCHARGE INSTRUCTIONS:    Activity:  As tolerated with Full fall precautions use walker/cane & assistance as needed  Get Medicines reviewed and adjusted: Please take all your medications with you for your next visit with your Primary MD  Please request your Primary MD to go over all hospital tests and procedure/radiological results at the follow up, please ask your Primary MD to get all Hospital records sent to his/her office.  If you experience worsening of your admission symptoms, develop shortness of breath, life threatening emergency, suicidal or homicidal thoughts you must seek medical attention immediately by calling 911 or calling your MD immediately  if symptoms less severe.  You must read complete instructions/literature along with all the possible adverse reactions/side effects for all the Medicines you take and that have been prescribed to you. Take any new Medicines after you have completely understood and accpet all the possible adverse reactions/side effects.   Do not drive when taking Pain medications.   Do not take more than prescribed Pain, Sleep and Anxiety Medications  Special Instructions: If you have smoked or chewed Tobacco  in the last 2 yrs please stop smoking, stop any regular Alcohol  and or any Recreational  drug use.  Wear Seat belts while driving.  Please note  You were cared for by a hospitalist during your hospital stay. Once you are discharged, your primary care physician will handle any further medical issues. Please note that NO REFILLS for any discharge medications will be authorized once you are discharged, as it is imperative that you return to your primary care physician (or establish a relationship with a primary care physician if you do not have one) for your aftercare needs so that they can reassess your need for medications and monitor your lab values.   Diet recommendation: Heart Healthy diet  Discharge Instructions    (HEART FAILURE PATIENTS) Call MD:  Anytime you have any of the following symptoms: 1) 3 pound weight gain in 24 hours or 5 pounds in 1 week 2) shortness of breath, with or without a dry hacking cough 3) swelling in the hands, feet or stomach 4) if you have to sleep on extra pillows at night in order to breathe.    Complete by:  As directed      Diet - low sodium heart healthy    Complete by:  As directed      Increase activity slowly    Complete by:  As directed            Follow-up Information    Follow up with Dorena Dew, FNP. Go on 08/24/2015.   Specialty:  Family Medicine   Why:  You have a scheduled appointment on September 23 2015 at 0900 with Cammie Sickle NP    Contact information:   [address] [place] [address] [phone]       Follow up with Richardson Dopp, PA-C On 09/06/2015.   Specialties:  Cardiology, Physician Assistant   Why:  9am for your hospital follow up.   Contact information:   [address] [place] [address] [phone]       Total Time spent on discharge equals 40 minutes  Signed: Oren Binet 08/31/2015 1:04 PM

## 2015-09-01 NOTE — Telephone Encounter (Signed)
Follow up      Returning a call to the nurse.  Correct phone number entered in computer

## 2015-09-01 NOTE — Telephone Encounter (Signed)
Left message on emergency contacts number; patients phone number on file is disconnected

## 2015-09-02 NOTE — Telephone Encounter (Signed)
Patient contacted regarding discharge from [facility] on August 31, 2015.  Patient understands to follow up with provider [name], PA on 09/06/15 at 0900 at [facility]. Patient understands discharge instructions? YES Patient understands medications and regiment? YES Patient understands to bring all medications to this visit? YES

## 2015-09-05 ENCOUNTER — Encounter: Payer: Self-pay | Admitting: Physician Assistant

## 2015-09-05 NOTE — Progress Notes (Signed)
Cardiology Office Note:    Date:  09/06/2015   ID:  Jennifer Owens, DOB Aug 15, 1970, MRN [id number]  PCP:  PROVIDER NOT IN SYSTEM  Cardiologist:  Dr. Chilton Si   Electrophysiologist:  n/a  Referring MD: No ref. provider found   Chief Complaint  Patient presents with  . Hospitalization Follow-up    admx with a/c CHF  . Transitional Care Management    Post Hospital FU within 7 days    History of Present Illness:     Jennifer Owens is a 45 y.o. female with a hx of HTN, asthma, morbid obesity, diastolic CHF. She was admitted in 5/17 with diastolic CHF which responded to IV diuresis. Echo at that time demonstrated EF 55-60% and mild diastolic dysfunction.   Admitted 6/21-7/5 with acute on chronic hypoxic respiratory failure secondary to acute combined systolic and diastolic CHF and MSSA pneumonia (?HCAP). Her hospitalization was complicated by worsening respiratory failure requiring intubation and mechanical ventilation.  She also had AKI with Creatinine increasing to 2.13 >> 0.80 at DC.  She had minimally elevated troponin levels. She was followed by cardiology. Echocardiogram demonstrated worsening LV function with an EF of 35-40% and RV systolic dysfunction.  Right and left heart catheterization demonstrated severe pulmonary hypertension with PA pressure 76/30, EF 40-50%, LVEDP 28 and normal coronary arteries. OP evaluation for sleep apnea was recommended. Aggressive medical therapy for systemic arterial hypertension was also recommended. Chest CTA in the hospital was negative for pulmonary embolism.   She returns for FU.  She is here alone. Since discharge from the hospital, she is feeling better. Her breathing is improved. She is still weak and admits to dyspnea with mild to moderate activities. She is NYHA 2b. She seeps on 2 pillows chronically without change. She denies PND. Pedal edema is improved overall.  She denies chest discomfort, palpitations, syncope. She has cough and mild  wheeze. She notes epistaxis from her O2. She has quit smoking. She sees primary care to establish later this month.  Past Medical History  Diagnosis Date  . Sciatica   . Asthma   . Chronic combined systolic and diastolic CHF (congestive heart failure) (HCC)     a. Echo 5/17: mild LVH, EF 55-60%, no RWMA, Gr 1 DD, mildly dilated Ao root, trivial eff //  b. Echo 6/17: mild conc LVH, EF 35-40%, RVE with mod RV dysfunction  . Morbid obesity (HCC)   . Thrombocytopenia (HCC)   . Hypertensive heart disease with CHF (congestive heart failure) (HCC)   . Tobacco abuse   . NICM (nonischemic cardiomyopathy) (HCC)     a. LHC 7/17 with normal cors  . Moderate to severe pulmonary hypertension (HCC)     a RHC 7/17 with PAP 76/30    Past Surgical History  Procedure Laterality Date  . Cholecystectomy    . Appendectomy    . Cesarean section    . Small intestine surgery    . Cardiac catheterization N/A 08/29/2015    Procedure: Right/Left Heart Cath and Coronary Angiography;  Surgeon: [name], MD;  Location: [facility];  Service: Cardiovascular;  Laterality: N/A;    Current Medications: Outpatient Prescriptions Prior to Visit  Medication Sig Dispense Refill  . albuterol (PROVENTIL HFA;VENTOLIN HFA) 108 (90 Base) MCG/ACT inhaler Inhale 2 puffs into the lungs every 6 (six) hours as needed for wheezing or shortness of breath. 1 Inhaler 0  . carvedilol (COREG) 6.25 MG tablet Take 1 tablet (6.25 mg total) by  mouth 2 (two) times daily with a meal. 60 tablet 0  . furosemide (LASIX) 40 MG tablet Take 1 tablet (40 mg total) by mouth daily. 30 tablet 0  . potassium chloride SA (K-DUR,KLOR-CON) 20 MEQ tablet Take 1 tablet (20 mEq total) by mouth 2 (two) times daily. 60 tablet 0   No facility-administered medications prior to visit.      Allergies:   Review of patient's allergies indicates no known allergies.   Social History   Social History  . Marital Status: Legally Separated    Spouse  Name: N/A  . Number of Children: N/A  . Years of Education: N/A   Occupational History  . 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 Smoking status: Current Every Day Smoker -- 0.25 packs/day for 10 years    Types: Cigarettes  . Smokeless tobacco: Never Used  . Alcohol Use: 0.6 oz/week    1 Standard drinks or equivalent per week     Comment: very rarely - 1-2 drinks/month  . Drug Use: No  . Sexual Activity: Not Asked   Other Topics Concern  . None   Social History Narrative     Family History:  The patient's family history includes Diabetes Mellitus II in her father and mother; Hypertension in her mother.   ROS:   Please see the history of present illness.    ROS All other systems reviewed and are negative.   Physical Exam:    VS:  BP 138/90 mmHg  Pulse 80  Ht 5' 3.5" (1.613 m)  Wt 314 lb (142.429 kg)  BMI 54.74 kg/m2  SpO2 96%   Physical Exam  Constitutional: She is oriented to person, place, and time. She appears well-developed and well-nourished. No distress.  HENT:  Head: Normocephalic and atraumatic.  Neck:  I cannot appreciate JVD  Cardiovascular: Normal rate, regular rhythm and normal heart sounds.  Exam reveals no gallop and no friction rub.   No murmur heard. Pulmonary/Chest: Effort normal and breath sounds normal. She has no wheezes. She has no rales.  Abdominal: Soft. There is no tenderness.  Musculoskeletal:  Trace bilateral ankle edema  Neurological: She is alert and oriented to person, place, and time.  Skin: Skin is warm and dry.  Psychiatric: She has a normal mood and affect.    Wt Readings from Last 3 Encounters:  09/06/15 314 lb (142.429 kg)  08/31/15 317 lb 0.3 oz (143.8 kg)  07/25/15 321 lb 11.2 oz (145.922 kg)     Studies/Labs Reviewed:     EKG:  EKG is   ordered today.  The ekg ordered today demonstrates NSR, HR 80, left axis deviation, nonspecific ST-T wave changes, QTc 429 ms  Recent Labs: 08/17/2015: B  Natriuretic Peptide 589.6* 08/26/2015: Magnesium 2.2 08/27/2015: ALT 18; TSH 0.659 08/30/2015: BUN 12; Creatinine, Ser 0.80; Potassium 4.2; Sodium 138 08/31/2015: Hemoglobin 11.1*; Platelets 103*   Recent Lipid Panel    Component Value Date/Time   CHOL 149 08/27/2015 0345   TRIG 166* 08/27/2015 0345   HDL 20* 08/27/2015 0345   CHOLHDL 7.5 08/27/2015 0345   VLDL 33 08/27/2015 0345   LDLCALC 96 08/27/2015 0345    Additional studies/ records that were reviewed today include:   Chest CTA 08/30/15 IMPRESSION: 1. No acute findings. No pulmonary embolism seen, with mild study limitations detailed above. 2. Cardiomegaly.  No pericardial effusion. 3. No aortic aneurysm. 4. No evidence of pneumonia.  No pleural effusion.  No pneumothorax.  R/L [facility] 08/29/15  Severe pulmonary hypertension with PA pressure of 76/30 mmHg.  Chronic combined systolic and diastolic heart failure LVEF 40-50%. Elevated LVEDP 28 mmHg.  Normal coronary arteries.  Severe systemic hypertension. RECOMMENDATIONS:  Evaluate for sleep apnea.  Chronic O2 therapy if appropriate.  Aggressive medical therapy of systemic essential arterial hypertension.  If pulmonary embolism has not been excluded, this should be performed with CT angio or VQ scanning.  Echo 08/24/15 - Left ventricle: The cavity size was moderately dilated. There was  mild concentric hypertrophy. Systolic function was normal. Wall  motion was normal; there were no regional wall motion  abnormalities. The study is not technically sufficient to allow  evaluation of LV diastolic function. - Right ventricle: The cavity size was moderately dilated. Wall  thickness was normal. Systolic function was moderately reduced. - Pulmonic valve: Not visualized. - Pulmonary arteries: Systolic pressure couldn&'t be assessed. - Inferior vena cava: Not visualized. - Pericardium, extracardiac: There was no pericardial effusion. Impressions: This is a very limited  quality study.  However, when compared to the prior study from 07/22/2015 the left  ventricle is now moderately dilated with decreased systolic function estimated LVEF 35-40%.   Right ventricle appears dilated with moderate systolic dysfunction.  Consider TEE or cardiac MRI for further evaluation.  Echo 07/22/15 - Left ventricle: The cavity size was normal. Wall thickness was  increased in a pattern of mild LVH. Systolic function was normal.  The estimated ejection fraction was in the range of 55% to 60%.  Wall motion was normal; there were no regional wall motion  abnormalities. Doppler parameters are consistent with abnormal  left ventricular relaxation (grade 1 diastolic dysfunction). - Aortic root: The aortic root was mildly dilated. - Pericardium, extracardiac: A trivial pericardial effusion was  identified. Impressions:  - Normal LV systolic function; grade 1 diastolic dysfunction; no significant valvular disease.   ASSESSMENT:     1. Chronic combined systolic and diastolic heart failure (HCC)   2. NICM (nonischemic cardiomyopathy) (HCC)   3. Pulmonary hypertension (HCC)   4. Hypertensive heart disease with heart failure (HCC)   5. Morbid obesity due to excess calories (HCC)   6. Tobacco abuse   7. Thrombocytopenia (HCC)     PLAN:     In order of problems listed above:  1. Chronic combined systolic and diastolic CHF - She returns for follow-up after recent prolonged hospitalization with acute CHF in the setting of probable HCAP complicated by VDRF.  Right and left heart catheterization demonstrated severe pulmonary hypertension and no coronary artery disease. Her volume remains stable. Her breathing is improved. Continue current dose of Lasix. She is tolerating her current dose of carvedilol. ACE/ARB was never started during her hospitalization. Her creatinine had increased but did return to normal prior to discharge.  -  Continue carvedilol  -  Start losartan 25 mg  daily  -  BMET today and repeat in one week  2. NICM - This may be from hypertension. Continue beta blocker. Add ARB. Consider repeat echocardiogram point in the next few months.  3. Pulmonary HTN - May all be from morbid obesity.  Will get sleep study arranged and continue to titrate CHF medications.  Consider FU Echo at some point in next 3 mos. If PASP remains elevated, may need referral to Pulmonology or Pulmonary HTN Clinic.    -  Schedule split night sleep study  4. Hypertensive Heart Disease w/ CHF -  Blood pressure remains above  target. Add losartan as noted above.  5. Morbid obesity - We discussed the importance of weight loss.  6. Tobacco abuse - She has quit smoking.  7. Thrombocytopenia - Etiology not clear. Follow-up with primary care.   Medication Adjustments/Labs and Tests Ordered: Current medicines are reviewed at length with the patient today.  Concerns regarding medicines are outlined above.  Medication changes, Labs and Tests ordered today are outlined in the Patient Instructions noted below. Patient Instructions  Medication Instructions:  1. START LOSARTAN 25 MG DAILY; RX HAS BEEN SENT Labwork: 1. TODAY BMET 2. YOU WILL NEED A REPEAT BMET TO BE DONE IN 1 WEEK Testing/Procedures: Your physician has recommended that you have a SPLIT NIGHT sleep study. This test records several body functions during sleep, including: brain activity, eye movement, oxygen and carbon dioxide blood levels, heart rate and rhythm, breathing rate and rhythm, the flow of air through your mouth and nose, snoring, body muscle movements, and chest and belly movement. BETHANY COOK, CMA WILL CALL YOU TO SCHEDULE STUDY Follow-Up: DR. Duke Salvia IN ABOUT 3-4 WEEKS  Any Other Special Instructions Will Be Listed Below (If Applicable). If you need a refill on your cardiac medications before your next appointment, please call your pharmacy.   Signed, [name], PA-C  09/06/2015 12:45 PM    [facility] [address] [place], [place], [place]  [address] Phone: [phone]; Fax: [phone]

## 2015-09-06 ENCOUNTER — Ambulatory Visit (INDEPENDENT_AMBULATORY_CARE_PROVIDER_SITE_OTHER): Payer: Self-pay | Admitting: Physician Assistant

## 2015-09-06 ENCOUNTER — Encounter: Payer: Self-pay | Admitting: Physician Assistant

## 2015-09-06 ENCOUNTER — Encounter (INDEPENDENT_AMBULATORY_CARE_PROVIDER_SITE_OTHER): Payer: Self-pay

## 2015-09-06 ENCOUNTER — Telehealth: Payer: Self-pay | Admitting: *Deleted

## 2015-09-06 VITALS — BP 138/90 | HR 80 | Ht 63.5 in | Wt 314.0 lb

## 2015-09-06 DIAGNOSIS — I428 Other cardiomyopathies: Secondary | ICD-10-CM

## 2015-09-06 DIAGNOSIS — Z720 Tobacco use: Secondary | ICD-10-CM

## 2015-09-06 DIAGNOSIS — I5042 Chronic combined systolic (congestive) and diastolic (congestive) heart failure: Secondary | ICD-10-CM

## 2015-09-06 DIAGNOSIS — I110 Hypertensive heart disease with heart failure: Secondary | ICD-10-CM

## 2015-09-06 DIAGNOSIS — I272 Other secondary pulmonary hypertension: Secondary | ICD-10-CM

## 2015-09-06 DIAGNOSIS — I429 Cardiomyopathy, unspecified: Secondary | ICD-10-CM

## 2015-09-06 DIAGNOSIS — D696 Thrombocytopenia, unspecified: Secondary | ICD-10-CM

## 2015-09-06 LAB — BASIC METABOLIC PANEL
BUN: 11 mg/dL (ref 7–25)
CALCIUM: 9.4 mg/dL (ref 8.6–10.2)
CO2: 34 mmol/L — AB (ref 20–31)
Chloride: 98 mmol/L (ref 98–110)
Creat: 0.9 mg/dL (ref 0.50–1.10)
GLUCOSE: 82 mg/dL (ref 65–99)
Potassium: 4.8 mmol/L (ref 3.5–5.3)
SODIUM: 141 mmol/L (ref 135–146)

## 2015-09-06 MED ORDER — LOSARTAN POTASSIUM 25 MG PO TABS: 25.0000 mg | ORAL_TABLET | Freq: Every day | ORAL | Status: DC

## 2015-09-06 NOTE — Telephone Encounter (Signed)
Lmtcb to go over lab results 

## 2015-09-06 NOTE — Patient Instructions (Addendum)
Medication Instructions:  1. START LOSARTAN 25 MG DAILY; RX HAS BEEN SENT Labwork: 1. TODAY BMET 2. YOU WILL NEED A REPEAT BMET TO BE DONE IN 1 WEEK Testing/Procedures: Your physician has recommended that you have a SPLIT NIGHT sleep study. This test records several body functions during sleep, including: brain activity, eye movement, oxygen and carbon dioxide blood levels, heart rate and rhythm, breathing rate and rhythm, the flow of air through your mouth and nose, snoring, body muscle movements, and chest and belly movement. BETHANY COOK, CMA WILL CALL YOU TO SCHEDULE STUDY Follow-Up: DR. Duke Salvia IN ABOUT 3-4 WEEKS  Any Other Special Instructions Will Be Listed Below (If Applicable). If you need a refill on your cardiac medications before your next appointment, please call your pharmacy.

## 2015-09-08 NOTE — Telephone Encounter (Signed)
Pt notified of lab results by phone with verbal understanding. Pt confirmed was she supposed to be taking the Losartan and Coreg. I confirmed yes to stay on both of these medications. Confirmed appt 7/18 for lab. Pt agreeable to plan of care.

## 2015-09-13 ENCOUNTER — Telehealth: Payer: Self-pay | Admitting: *Deleted

## 2015-09-13 ENCOUNTER — Other Ambulatory Visit (INDEPENDENT_AMBULATORY_CARE_PROVIDER_SITE_OTHER): Payer: Self-pay

## 2015-09-13 DIAGNOSIS — I5042 Chronic combined systolic (congestive) and diastolic (congestive) heart failure: Secondary | ICD-10-CM

## 2015-09-13 LAB — BASIC METABOLIC PANEL
BUN: 11 mg/dL (ref 7–25)
CHLORIDE: 101 mmol/L (ref 98–110)
CO2: 32 mmol/L — AB (ref 20–31)
Calcium: 8.9 mg/dL (ref 8.6–10.2)
Creat: 0.97 mg/dL (ref 0.50–1.10)
Glucose, Bld: 97 mg/dL (ref 65–99)
POTASSIUM: 4.2 mmol/L (ref 3.5–5.3)
Sodium: 142 mmol/L (ref 135–146)

## 2015-09-13 NOTE — Telephone Encounter (Signed)
Pt notified of lab results by phone with verbal understanding.  

## 2015-09-15 ENCOUNTER — Ambulatory Visit (HOSPITAL_BASED_OUTPATIENT_CLINIC_OR_DEPARTMENT_OTHER): Payer: Self-pay | Attending: Physician Assistant | Admitting: Cardiology

## 2015-09-15 VITALS — Ht 63.0 in | Wt 311.0 lb

## 2015-09-15 DIAGNOSIS — Z6841 Body Mass Index (BMI) 40.0 and over, adult: Secondary | ICD-10-CM | POA: Insufficient documentation

## 2015-09-15 DIAGNOSIS — I493 Ventricular premature depolarization: Secondary | ICD-10-CM | POA: Insufficient documentation

## 2015-09-15 DIAGNOSIS — G4719 Other hypersomnia: Secondary | ICD-10-CM

## 2015-09-15 DIAGNOSIS — G4733 Obstructive sleep apnea (adult) (pediatric): Secondary | ICD-10-CM | POA: Insufficient documentation

## 2015-09-15 DIAGNOSIS — I509 Heart failure, unspecified: Secondary | ICD-10-CM | POA: Insufficient documentation

## 2015-09-15 DIAGNOSIS — R0683 Snoring: Secondary | ICD-10-CM | POA: Insufficient documentation

## 2015-09-15 DIAGNOSIS — G4734 Idiopathic sleep related nonobstructive alveolar hypoventilation: Secondary | ICD-10-CM | POA: Insufficient documentation

## 2015-09-15 DIAGNOSIS — E669 Obesity, unspecified: Secondary | ICD-10-CM | POA: Insufficient documentation

## 2015-09-15 DIAGNOSIS — I110 Hypertensive heart disease with heart failure: Secondary | ICD-10-CM | POA: Insufficient documentation

## 2015-09-15 DIAGNOSIS — I272 Other secondary pulmonary hypertension: Secondary | ICD-10-CM

## 2015-09-15 DIAGNOSIS — R5383 Other fatigue: Secondary | ICD-10-CM | POA: Insufficient documentation

## 2015-09-23 ENCOUNTER — Ambulatory Visit (INDEPENDENT_AMBULATORY_CARE_PROVIDER_SITE_OTHER): Payer: Self-pay | Admitting: Family Medicine

## 2015-09-23 ENCOUNTER — Encounter: Payer: Self-pay | Admitting: Family Medicine

## 2015-09-23 VITALS — BP 157/91 | HR 98 | Temp 98.9°F | Resp 20 | Ht 63.5 in | Wt 309.0 lb

## 2015-09-23 DIAGNOSIS — Z1239 Encounter for other screening for malignant neoplasm of breast: Secondary | ICD-10-CM

## 2015-09-23 DIAGNOSIS — Z7689 Persons encountering health services in other specified circumstances: Secondary | ICD-10-CM

## 2015-09-23 DIAGNOSIS — Z7189 Other specified counseling: Secondary | ICD-10-CM

## 2015-09-23 NOTE — Progress Notes (Signed)
Jennifer Owens, is a 45 y.o. female  ZOX:[id number]  [id number]:[id number]  DOB - Jul 03, 1970  CC:  Chief Complaint  Patient presents with  . Establish Care    wants to know when she can go back to work.   . Foot Swelling    more in right foot and in pain        HPI: Jennifer Owens is a 45 y.o. female here to establish care. She was in hospital with CHF from June 21 - July 5. She has a follow-up visit with cardiology next week. Her  Problem list includes Asthma, CFH 9systolic and diastolic). Hypertension, pulmonary hypertension, morbid obesity and cardiomyopathy. Her current medication are carvedilol 6.25 bid, Lasix 40 daily, Losartan 25 daily and Potassium 20 meq daily. These wer all prescribed by cardiologist at [facility]. She reports some continued pedal edema despite the Lasix and some right foot pain.   Health Maintenance:  Is in need of PAP and mammogram. No Known Allergies Past Medical History:  Diagnosis Date  . Asthma   . Chronic combined systolic and diastolic CHF (congestive heart failure) (HCC)    a. Echo 5/17: mild LVH, EF 55-60%, no RWMA, Gr 1 DD, mildly dilated Ao root, trivial eff //  b. Echo 6/17: mild conc LVH, EF 35-40%, RVE with mod RV dysfunction  . Hypertensive heart disease with CHF (congestive heart failure) (HCC)   . Moderate to severe pulmonary hypertension (HCC)    a RHC 7/17 with PAP 76/30  . Morbid obesity (HCC)   . NICM (nonischemic cardiomyopathy) (HCC)    a. LHC 7/17 with normal cors  . Sciatica   . Thrombocytopenia (HCC)   . Tobacco abuse    Current Outpatient Prescriptions on File Prior to Visit  Medication Sig Dispense Refill  . albuterol (PROVENTIL HFA;VENTOLIN HFA) 108 (90 Base) MCG/ACT inhaler Inhale 2 puffs into the lungs every 6 (six) hours as needed for wheezing or shortness of breath. 1 Inhaler 0  . carvedilol (COREG) 6.25 MG tablet Take 1 tablet (6.25 mg total) by mouth 2 (two) times daily with a meal. 60 tablet 0  . furosemide (LASIX)  40 MG tablet Take 1 tablet (40 mg total) by mouth daily. 30 tablet 0  . losartan (COZAAR) 25 MG tablet Take 1 tablet (25 mg total) by mouth daily. 90 tablet 3  . potassium chloride SA (K-DUR,KLOR-CON) 20 MEQ tablet Take 1 tablet (20 mEq total) by mouth 2 (two) times daily. 60 tablet 0   No current facility-administered medications on file prior to visit.    Family History  Problem Relation Age of Onset  . Diabetes Mellitus II Mother   . Hypertension Mother   . Diabetes Mellitus II Father    Social History   Social History  . Marital status: Legally Separated    Spouse name: N/A  . Number of children: N/A  . Years of education: N/A   Occupational History  . 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 Smoking status: Former Smoker    Packs/day: 0.25    Years: 10.00    Types: Cigarettes    Quit date: 07/2015  . Smokeless tobacco: Never Used  . Alcohol use No     Comment: very rarely - 1-2 drinks/month  . Drug use: No  . Sexual activity: Not on file   Other Topics Concern  . Not on file   Social History Narrative  . No narrative on file  Review of Systems: Constitutional: Negative for fever, chills, appetite change, weight loss. Positive for fatigue Skin: Negative for rashes or lesions of concern. HENT: Negative for ear pain, ear discharge.nose bleeds Eyes: Negative for pain, discharge, redness, itching and visual disturbance. Needs glasses Neck: Negative for pain, stiffness Respiratory: Negative for cough, shortness of breath,   Cardiovascular: Negative for chest pain, palpitations and leg swelling. Gastrointestinal: Negative for abdominal pain, vomiting, diarrhea, constipations. Reports occasional nausea Genitourinary: Negative for dysuria, urgency, frequency, hematuria,  Musculoskeletal: Negative for back pain, joint pain, joint  swelling, and gait problem.Negative for weakness. Right foot pain Neurological: Negative for dizziness, tremors,  seizures, syncope,   light-headedness, numbness and headaches.  Hematological: Negative for easy bruising or bleeding Psychiatric/Behavioral: Negative for depression, anxiety, decreased concentration, confusion   Objective:   Vitals:   09/23/15 0839  BP: (!) 157/91  Pulse: 98  Resp: 20  Temp: 98.9 F (37.2 C)    Physical Exam: Constitutional: Patient appears well-developed and well-nourished. No distress. Morbidly obese HENT: Normocephalic, atraumatic, External right and left ear normal. Oropharynx is clear and moist.  Eyes: Conjunctivae and EOM are normal. PERRLA, no scleral icterus. Neck: Normal ROM. Neck supple. No lymphadenopathy, No thyromegaly. CVS: RRR, S1/S2 +, no murmurs, no gallops, no rubs Pulmonary: Effort and breath sounds normal, no stridor, rhonchi, wheezes, rales.  Abdominal: Soft. Normoactive BS,, no distension, tenderness, rebound or guarding.  Musculoskeletal: Normal range of motion. No edema and no tenderness. There is tenderness of top of right foot Neuro: Alert.Normal muscle tone coordination. Non-focal Skin: Skin is warm and dry. No rash noted. Not diaphoretic. No erythema. No pallor. Psychiatric: Normal mood and affect. Behavior, judgment, thought content normal.  Lab Results  Component Value Date   WBC 6.2 08/31/2015   HGB 11.1 (L) 08/31/2015   HCT 35.0 (L) 08/31/2015   MCV 96.2 08/31/2015   PLT 103 (L) 08/31/2015   Lab Results  Component Value Date   CREATININE 0.97 09/13/2015   BUN 11 09/13/2015   NA 142 09/13/2015   K 4.2 09/13/2015   CL 101 09/13/2015   CO2 32 (H) 09/13/2015    No results found for: HGBA1C Lipid Panel     Component Value Date/Time   CHOL 149 08/27/2015 0345   TRIG 166 (H) 08/27/2015 0345   HDL 20 (L) 08/27/2015 0345   CHOLHDL 7.5 08/27/2015 0345   VLDL 33 08/27/2015 0345   LDLCALC 96 08/27/2015 0345       Assessment and plan:   1. Encounter to establish care -I have reviewed information provided by the  patient and information available in the chart.  2. Need for mammogram -Mammogram schedules  3. Need for PAP smear -Requested she make appointment for PAP in near future.  4. CHF -Follow-up with cardiology as planned.   Return in about 6 months (around 03/25/2016).  The patient was given clear instructions to go to ER or return to medical center if symptoms don't improve, worsen or new problems develop. The patient verbalized understanding.    Henrietta Hoover FNP  09/23/2015, 9:33 AM

## 2015-09-23 NOTE — Patient Instructions (Signed)
Continue current medications Follow-up with cardiology as planned. Follow-up here in 6 months, sooner if needed.

## 2015-09-28 ENCOUNTER — Other Ambulatory Visit: Payer: Self-pay | Admitting: Physician Assistant

## 2015-09-28 DIAGNOSIS — I5042 Chronic combined systolic (congestive) and diastolic (congestive) heart failure: Secondary | ICD-10-CM

## 2015-09-28 MED ORDER — LOSARTAN POTASSIUM 25 MG PO TABS: 25.0000 mg | ORAL_TABLET | Freq: Every day | ORAL | 3 refills | Status: DC

## 2015-09-28 MED ORDER — CARVEDILOL 6.25 MG PO TABS: 6.2500 mg | ORAL_TABLET | Freq: Two times a day (BID) | ORAL | 3 refills | Status: DC

## 2015-09-28 MED ORDER — POTASSIUM CHLORIDE CRYS ER 20 MEQ PO TBCR: 20.0000 meq | EXTENDED_RELEASE_TABLET | Freq: Two times a day (BID) | ORAL | 3 refills | Status: DC

## 2015-09-28 MED ORDER — FUROSEMIDE 40 MG PO TABS: 40.0000 mg | ORAL_TABLET | Freq: Every day | ORAL | 3 refills | Status: DC

## 2015-09-28 NOTE — Telephone Encounter (Signed)
Pt was discharged from the hospital,they told her to get her medicine refill by the Cardiologist.     *STAT* If patient is at the pharmacy, call can be transferred to refill team.   1. Which medications need to be refilled? (please list name of each medication and dose if known)Potassium Chloride 20 meq,Carvedilol 6.25 mg,Furosemide 40 mg and Losartan 25 mg2. Which pharmacy/location (including street and city if local pharmacy) is medication to be sent to?[phone]  3. Do they need a 30 day or 90 day supply? 30 and refills

## 2015-09-29 ENCOUNTER — Other Ambulatory Visit: Payer: Self-pay | Admitting: Physician Assistant

## 2015-09-29 DIAGNOSIS — I5042 Chronic combined systolic (congestive) and diastolic (congestive) heart failure: Secondary | ICD-10-CM

## 2015-09-29 MED ORDER — LOSARTAN POTASSIUM 25 MG PO TABS: 25.0000 mg | ORAL_TABLET | Freq: Every day | ORAL | 3 refills | Status: DC

## 2015-09-29 MED ORDER — POTASSIUM CHLORIDE CRYS ER 20 MEQ PO TBCR: 20.0000 meq | EXTENDED_RELEASE_TABLET | Freq: Two times a day (BID) | ORAL | 3 refills | Status: DC

## 2015-09-29 MED ORDER — FUROSEMIDE 40 MG PO TABS: 40.0000 mg | ORAL_TABLET | Freq: Every day | ORAL | 3 refills | Status: DC

## 2015-09-29 MED ORDER — CARVEDILOL 6.25 MG PO TABS: 6.2500 mg | ORAL_TABLET | Freq: Two times a day (BID) | ORAL | 3 refills | Status: DC

## 2015-09-29 NOTE — Procedures (Signed)
Patient Name: Jennifer Owens, Jennifer Owens Date: 09/15/2015 Gender: Female D.O.B: [birth year] Age (years): 44 Referring Provider: Richardson Dopp Height (inches): 97 Interpreting Physician: Fransico Him MD, ABSM Weight (lbs): 311 RPSGT: Laren Everts BMI: 55 MRN: [id number] Neck Size: 16.00  CLINICAL INFORMATION Sleep Study Type: Split Night CPAP Indication for sleep study: Congestive Heart Failure, Fatigue, Hypertension, Obesity Epworth Sleepiness Score: 6  SLEEP STUDY TECHNIQUE As per the AASM Manual for the Scoring of Sleep and Associated Events v2.3 (April 2016) with a hypopnea requiring 4% desaturations. The channels recorded and monitored were frontal, central and occipital EEG, electrooculogram (EOG), submentalis EMG (chin), nasal and oral airflow, thoracic and abdominal wall motion, anterior tibialis EMG, snore microphone, electrocardiogram, and pulse oximetry. Continuous positive airway pressure (CPAP) was initiated when the patient met split night criteria and was titrated according to treat sleep-disordered breathing.  MEDICATIONS Medications taken by the patient : Reviewed in the chart Medications administered by patient during sleep study : No sleep medicine administered.  RESPIRATORY PARAMETERS Diagnostic Total AHI (/hr): 22.9  RDI (/hr):25.7   OA Index (/hr):19.1  CA Index (/hr): 0.0 REM AHI (/hr): 94.6  NREM AHI (/hr):4.7  Supine AHI (/hr):22.9  Non-supine AHI (/hr):N/A Min O2 Sat (%):73.00  Mean O2 (%):95.84  Time below 88% (min):4.1      Titration Optimal Pressure (cm):N/A  AHI at Optimal Pressure (/hr):N/A  Min O2 at Optimal Pressure (%):N/A Supine % at Optimal (%):N/A  Sleep % at Optimal (%):N/A      SLEEP ARCHITECTURE The recording time for the entire night was 398.2 minutes. During a baseline period of 154.6 minutes, the patient slept for 128.5 minutes in REM and nonREM, yielding a sleep efficiency of 83.1%. Sleep onset after lights out was 20.7 minutes with  a REM latency of 55.0 minutes. The patient spent 7.00% of the night in stage N1 sleep, 72.76% in stage N2 sleep, 0.00% in stage N3 and 20.23% in REM. During the titration period of 238.0 minutes, the patient slept for 120.5 minutes in REM and nonREM, yielding a sleep efficiency of 50.6%. Sleep onset after CPAP initiation was 0.0 minutes with a REM latency of 136.0 minutes. The patient spent 13.28% of the night in stage N1 sleep, 69.29% in stage N2 sleep, 0.00% in stage N3 and 17.43% in REM.  CARDIAC DATA The 2 lead EKG demonstrated sinus rhythm. The mean heart rate was 78.85 beats per minute. Other EKG findings include: PVCs.  LEG MOVEMENT DATA The total Periodic Limb Movements of Sleep (PLMS) were 0. The PLMS index was 0.00 .  IMPRESSIONS - Moderate obstructive sleep apnea occurred during the diagnostic portion of the study(AHI = 22.9/hour). An optimal PAP pressure was selected for this patient ( 15 cm of water) - No significant central sleep apnea occurred during the diagnostic portion of the study (CAI = 0.0/hour). - Severe oxygen desaturation was noted during the diagnostic portion of the study (Min O2 = 73.00%). - The patient snored with Moderate snoring volume during the diagnostic portion of the study. - EKG findings include PVCs. - Clinically significant periodic limb movements did not occur during sleep.  DIAGNOSIS - Obstructive Sleep Apnea (327.23 [G47.33 ICD-10]) - Nocturnal Hypoxemia  RECOMMENDATIONS - As the patient was unable to be adequately titrated on CPAP due to ongoing respiratory events, recommend BiPAP titration . - Avoid alcohol, sedatives and other CNS depressants that may worsen sleep apnea and disrupt normal sleep architecture. - Sleep hygiene should be reviewed to assess factors that may improve  sleep quality. - Weight management and regular exercise should be initiated or continued.  [place], American Board of Sleep Medicine  ELECTRONICALLY  SIGNED ON:  09/29/2015, 9:54 PM [place] PH: [phone]   FX: [phone] [place]

## 2015-10-04 ENCOUNTER — Other Ambulatory Visit: Payer: Self-pay

## 2015-10-04 DIAGNOSIS — G4733 Obstructive sleep apnea (adult) (pediatric): Secondary | ICD-10-CM

## 2015-10-04 NOTE — Progress Notes (Signed)
Spoke with patient and advised of sleep study results and Dr.Turner's recommendations. Patient is ok with going back into the sleep lab for bipap titration. Will schedule appt and mail letter.

## 2015-10-06 ENCOUNTER — Ambulatory Visit (INDEPENDENT_AMBULATORY_CARE_PROVIDER_SITE_OTHER): Payer: Self-pay | Admitting: Cardiovascular Disease

## 2015-10-06 ENCOUNTER — Encounter: Payer: Self-pay | Admitting: Cardiovascular Disease

## 2015-10-06 VITALS — BP 116/73 | HR 78 | Ht 63.0 in | Wt 313.2 lb

## 2015-10-06 DIAGNOSIS — I272 Other secondary pulmonary hypertension: Secondary | ICD-10-CM

## 2015-10-06 DIAGNOSIS — G4733 Obstructive sleep apnea (adult) (pediatric): Secondary | ICD-10-CM

## 2015-10-06 DIAGNOSIS — I10 Essential (primary) hypertension: Secondary | ICD-10-CM

## 2015-10-06 DIAGNOSIS — I5042 Chronic combined systolic (congestive) and diastolic (congestive) heart failure: Secondary | ICD-10-CM

## 2015-10-06 NOTE — Patient Instructions (Signed)
Medication Instructions:  Your physician recommends that you continue on your current medications as directed. Please refer to the Current Medication list given to you today.  Labwork: none  Testing/Procedures: none  Follow-Up: Your physician recommends that you schedule a follow-up appointment in: 3 month ov  Any Other Special Instructions Will Be Listed Below (If Applicable). Weigh yourself daily first thing in the morning. If your weight goes up 2 pounds over night or 5 pounds in a week increase your Furosemide to 40 mg twice a day for 2 days only. After 2 days go back to your usual dose of Furosemide   Limit your fluid intake to 2 liters a day (about 67.6 ounces a day)  Fluid Restriction Some health conditions may require you to restrict your fluid intake. This means that you need to limit the amount of fluid you drink each day. When you have a fluid restriction, you must carefully measure and keep track of the amount of fluid you drink. Your health care provider will identify the specific amount of fluid you are allowed each day. This amount may depend on several things, such as:  The amount of urine you produce in a day.  How much fluid you are keeping (retaining) in your body.  Your blood pressure. WHAT IS MY PLAN? Your health care provider recommends that you limit your fluid intake to __________ per day. WHAT COUNTS TOWARD MY FLUID INTAKE? Your fluid intake includes all liquids that you drink, as well as any foods that become liquid at room temperature.  The following are examples of some fluids that you will have to restrict:  Tea, coffee, soda, lemonade, milk, water, juice, sport drinks, and nutritional supplement beverages.  Alcoholic beverages.  Cream.  Gravy.  Ice cubes.  Soup and broth. The following are examples of foods that become liquid at room temperature. These foods will also count toward your fluid intake.  Ice cream and ice milk.  Frozen yogurt  and sherbet.  Frozen ice pops.  Flavored gelatin. HOW DO I KEEP TRACK OF MY FLUID INTAKE? Each morning, fill a jug with the amount of water that equals the amount of fluid you are allowed for the day. You can use this water as a guideline for fluid allowance. Each time you take in any form of fluid, including ice cubes and foods that become liquid at room temperature, pour an equal amount of water out of the container. This helps you to see how much fluid you are taking in. It also helps you to see how much of your fluid intake is left for the rest of the day. The following conversions may also be helpful in measuring your fluid intake:  1 cup equals 8 oz (240 mL).   cup equals 6 oz (180 mL).   cup equals 5 oz (160 mL).   cup equals 4 oz (120 mL).   cup equals 2 oz (80 mL).   cup equals 2 oz (60 mL).  2 Tbsp equals 1 oz (30 mL). WHAT HOME CARE INSTRUCTIONS SHOULD I FOLLOW WHILE RESTRICTING FLUIDS?  Make sure that you stay within the recommended limit each day. Always measure and keep track of your fluids, as well as any foods that turn liquid at room temperature.  Use small cups and glasses and learn to sip fluids slowly.  Add a slice of fresh lemon or lemon juice to water or ice. This helps to satisfy your thirst.  Freeze fruit juice or water in an  ice cube tray. Use this as part of your fluid allowance. These cubes are useful for quenching your thirst. Measure the amount of liquid in each ice cube prior to freezing so you can subtract this amount from your day's allowance when you consume each frozen cube.  Try frozen fruits between meals, such as grapes or strawberries.  Swallow your pills along with meals or soft foods, such as applesauce or mashed potatoes. This helps you to save your fluid allowance for something that you enjoy.  Weigh yourself every day. Keeping track of your daily weight can help you and your health care provider to notice as soon as possible if you are  retaining too much fluid in your body.  Weigh yourself every morning after you urinate but before you eat breakfast.  Wear the same amount of clothing each time you weigh yourself.  Write down your daily weight. Give this weight record to your health care provider. If your weight is going up, you may be retaining too much fluid. Every 2 cups (480 mL) of fluid retained in the body becomes an extra 1 lb (0.45 kg) of body weight.  Avoid salty foods. These foods make you thirsty and make fluid control more difficult.  Brush your teeth often or rinse your mouth with mouthwash to help your dry mouth. Lemon wedges, hard sour candies, chewing gum, or breath spray may also help to moisten your mouth.  Keep the temperature in your home at a cooler level. Dry air increases thirst, so keep the air in your home as humid as possible.  Avoid being out in the hot sun, which can cause you to sweat and become thirsty. WHAT ARE SOME SIGNS THAT I MAY BE TAKING IN TOO MUCH FLUID? You may be taking in too much fluid if:  Your weight increases. Contact your health care provider if your weight increases 3 lb or more in a day or if it increases 5 lb or more in a week.  Your face, hands, legs, feet, and belly (abdomen) start to swell.  You have trouble breathing.   This information is not intended to replace advice given to you by your health care provider. Make sure you discuss any questions you have with your health care provider.   Document Released: 12/10/2006 Document Revised: 03/05/2014 Document Reviewed: 07/14/2013 Elsevier Interactive Patient Education 2016 Elsevier Inc.   Low-Sodium Eating Plan Sodium raises blood pressure and causes water to be held in the body. Getting less sodium from food will help lower your blood pressure, reduce any swelling, and protect your heart, liver, and kidneys. We get sodium by adding salt (sodium chloride) to food. Most of our sodium comes from canned, boxed, and  frozen foods. Restaurant foods, fast foods, and pizza are also very high in sodium. Even if you take medicine to lower your blood pressure or to reduce fluid in your body, getting less sodium from your food is important. WHAT IS MY PLAN? Most people should limit their sodium intake to 2,300 mg a day. Your health care provider recommends that you limit your sodium intake to ___2 grams__ a day.  WHAT DO I NEED TO KNOW ABOUT THIS EATING PLAN? For the low-sodium eating plan, you will follow these general guidelines:  Choose foods with a % Daily Value for sodium of less than 5% (as listed on the food label).   Use salt-free seasonings or herbs instead of table salt or sea salt.   Check with your health care  provider or pharmacist before using salt substitutes.   Eat fresh foods.  Eat more vegetables and fruits.  Limit canned vegetables. If you do use them, rinse them well to decrease the sodium.   Limit cheese to 1 oz (28 g) per day.   Eat lower-sodium products, often labeled as "lower sodium" or "no salt added."  Avoid foods that contain monosodium glutamate (MSG). MSG is sometimes added to [place] food and some canned foods.  Check food labels (Nutrition Facts labels) on foods to learn how much sodium is in one serving.  Eat more home-cooked food and less restaurant, buffet, and fast food.  When eating at a restaurant, ask that your food be prepared with less salt, or no salt if possible.  HOW DO I READ FOOD LABELS FOR SODIUM INFORMATION? The Nutrition Facts label lists the amount of sodium in one serving of the food. If you eat more than one serving, you must multiply the listed amount of sodium by the number of servings. Food labels may also identify foods as:  Sodium free--Less than 5 mg in a serving.  Very low sodium--35 mg or less in a serving.  Low sodium--140 mg or less in a serving.  Light in sodium--50% less sodium in a serving. For example, if a food that  usually has 300 mg of sodium is changed to become light in sodium, it will have 150 mg of sodium.  Reduced sodium--25% less sodium in a serving. For example, if a food that usually has 400 mg of sodium is changed to reduced sodium, it will have 300 mg of sodium. WHAT FOODS CAN I EAT? Grains Low-sodium cereals, including oats, puffed wheat and rice, and shredded wheat cereals. Low-sodium crackers. Unsalted rice and pasta. Lower-sodium bread.  Vegetables Frozen or fresh vegetables. Low-sodium or reduced-sodium canned vegetables. Low-sodium or reduced-sodium tomato sauce and paste. Low-sodium or reduced-sodium tomato and vegetable juices.  Fruits Fresh, frozen, and canned fruit. Fruit juice.  Meat and Other Protein Products Low-sodium canned tuna and salmon. Fresh or frozen meat, poultry, seafood, and fish. Lamb. Unsalted nuts. Dried beans, peas, and lentils without added salt. Unsalted canned beans. Homemade soups without salt. Eggs.  Dairy Milk. Soy milk. Ricotta cheese. Low-sodium or reduced-sodium cheeses. Yogurt.  Condiments Fresh and dried herbs and spices. Salt-free seasonings. Onion and garlic powders. Low-sodium varieties of mustard and ketchup. Fresh or refrigerated horseradish. Lemon juice.  Fats and Oils Reduced-sodium salad dressings. Unsalted butter.  Other Unsalted popcorn and pretzels.  The items listed above may not be a complete list of recommended foods or beverages. Contact your dietitian for more options. WHAT FOODS ARE NOT RECOMMENDED? Grains Instant hot cereals. Bread stuffing, pancake, and biscuit mixes. Croutons. Seasoned rice or pasta mixes. Noodle soup cups. Boxed or frozen macaroni and cheese. Self-rising flour. Regular salted crackers. Vegetables Regular canned vegetables. Regular canned tomato sauce and paste. Regular tomato and vegetable juices. Frozen vegetables in sauces. Salted [place] fries. Olives. Rosita Fire. Relishes. Sauerkraut. Salsa. Meat and  Other Protein Products Salted, canned, smoked, spiced, or pickled meats, seafood, or fish. Bacon, ham, sausage, hot dogs, corned beef, chipped beef, and packaged luncheon meats. Salt pork. Jerky. Pickled herring. Anchovies, regular canned tuna, and sardines. Salted nuts. Dairy Processed cheese and cheese spreads. Cheese curds. Blue cheese and cottage cheese. Buttermilk.  Condiments Onion and garlic salt, seasoned salt, table salt, and sea salt. Canned and packaged gravies. Worcestershire sauce. Tartar sauce. Barbecue sauce. Teriyaki sauce. Soy sauce, including reduced sodium. Steak sauce. Fish  sauce. Oyster sauce. Cocktail sauce. Horseradish that you find on the shelf. Regular ketchup and mustard. Meat flavorings and tenderizers. Bouillon cubes. Hot sauce. Tabasco sauce. Marinades. Taco seasonings. Relishes. Fats and Oils Regular salad dressings. Salted butter. Margarine. Ghee. Bacon fat.  Other Potato and tortilla chips. Corn chips and puffs. Salted popcorn and pretzels. Canned or dried soups. Pizza. Frozen entrees and pot pies.  The items listed above may not be a complete list of foods and beverages to avoid. Contact your dietitian for more information.   This information is not intended to replace advice given to you by your health care provider. Make sure you discuss any questions you have with your health care provider.   Document Released: 08/04/2001 Document Revised: 03/05/2014 Document Reviewed: 12/17/2012 Elsevier Interactive Patient Education [employer]

## 2015-10-06 NOTE — Progress Notes (Signed)
Cardiology Office Note   Date:  10/06/2015   ID:  Jennifer Owens, DOB 06/24/1970, MRN [id number]  PCP:  Concepcion Living, NP  Cardiologist:   Chilton Si, MD   Chief Complaint  Patient presents with  . Follow-up    pt c/o swelling in feet, also feels nauseous after taking medicine. pt also has not taken her potassium in 5 days due to cost     History of Present Illness: Jennifer Owens is a 45 y.o. female with asthma, COPD, morbid obesity, and pulmonary hypertension who presents for hospital follow up.  [name] was admitted to the hospital 6/21 through 7/5 with hypoxic respiratory failure in the setting of acute systolic and diastolic heart failure and MSSA pneumonia. She required intubation and diuresis.  She was noted to have severe pulmonary hypertension.  She had an echo 08/24/15 that revealed LVEF 35-40% with a moderately reduced right ventricular function.  She then had a left heart cath on 08/29/15 that revealed no obstructive coronary disease and her ejection fraction was 40-50% with LVEDP 28 mmHg.  her right heart catheterization revealed severe pulmonary hypertension with a PA pressure of 76/30.  She underwent a CT scan that was negative for pulmonary embolism.  After discharge she underwent a sleep study that revealed moderate obstructive sleep apnea.  She was not adequately titrated on CPAP due to ongoing respiratory events so BiPAP titration was recommended.  She followed up with [name] on 7/10 and was started on losartan.  Since being discharged [name] has been doing well.  Her breathing has been much better.  She sometimes notes lower extremity edema but denies orthopnea or PND.  She has not noted any chest pain and her weight has been ranging between 304-315.  She has been trying to improve her diet by eliminating fried food.  She is no longer smoking or drinking alcohol.  She plans to go back for her bipap fitting soon.   Past Medical History:  Diagnosis Date    . Asthma   . Chronic combined systolic and diastolic CHF (congestive heart failure) (HCC)    a. Echo 5/17: mild LVH, EF 55-60%, no RWMA, Gr 1 DD, mildly dilated Ao root, trivial eff //  b. Echo 6/17: mild conc LVH, EF 35-40%, RVE with mod RV dysfunction  . Hypertensive heart disease with CHF (congestive heart failure) (HCC)   . Moderate to severe pulmonary hypertension (HCC)    a RHC 7/17 with PAP 76/30  . Morbid obesity (HCC)   . NICM (nonischemic cardiomyopathy) (HCC)    a. LHC 7/17 with normal cors  . Sciatica   . Thrombocytopenia (HCC)   . Tobacco abuse     Past Surgical History:  Procedure Laterality Date  . APPENDECTOMY    . CARDIAC CATHETERIZATION N/A 08/29/2015   Procedure: Right/Left Heart Cath and Coronary Angiography;  Surgeon: [name], MD;  Location: [facility] INVASIVE CV LAB;  Service: Cardiovascular;  Laterality: N/A;  . CESAREAN SECTION    . CHOLECYSTECTOMY    . SMALL INTESTINE SURGERY       Current Outpatient Prescriptions  Medication Sig Dispense Refill  . albuterol (PROVENTIL HFA;VENTOLIN HFA) 108 (90 Base) MCG/ACT inhaler Inhale 2 puffs into the lungs every 6 (six) hours as needed for wheezing or shortness of breath. 1 Inhaler 0  . carvedilol (COREG) 6.25 MG tablet Take 1 tablet (6.25 mg total) by mouth 2 (two) times daily with a meal. 180 tablet 3  .  furosemide (LASIX) 40 MG tablet Take 1 tablet (40 mg total) by mouth daily. 90 tablet 3  . losartan (COZAAR) 25 MG tablet Take 1 tablet (25 mg total) by mouth daily. 90 tablet 3  . potassium chloride SA (K-DUR,KLOR-CON) 20 MEQ tablet Take 1 tablet (20 mEq total) by mouth 2 (two) times daily. 180 tablet 3   No current facility-administered medications for this visit.     Allergies:   Review of patient's allergies indicates no known allergies.    Social History:  The patient  reports that she quit smoking about 2 months ago. Her smoking use included Cigarettes. She has a 2.50 pack-year smoking history. She has  never used smokeless tobacco. She reports that she does not drink alcohol or use drugs.   Family History:  The patient's family history includes Diabetes Mellitus II in her father and mother; Hypertension in her mother.    ROS:  Please see the history of present illness.   Otherwise, review of systems are positive for none.   All other systems are reviewed and negative.    PHYSICAL EXAM: VS:  BP 116/73 (BP Location: Right Arm, Patient Position: Sitting)   Pulse 78   Ht  (1.6 m)   Wt (!) 313 lb 3.2 oz (142.1 kg)   SpO2 96%   BMI 55.48 kg/m  , BMI Body mass index is 55.48 kg/m. GENERAL:  Well appearing.  Morbidly obese. HEENT:  Pupils equal round and reactive, fundi not visualized, oral mucosa unremarkable NECK:  No jugular venous distention, waveform within normal limits, carotid upstroke brisk and symmetric, no bruits, no thyromegaly LYMPHATICS:  No cervical adenopathy LUNGS:  Clear to auscultation bilaterally HEART:  RRR.  PMI not displaced or sustained,S1 and S2 within normal limits, no S3, no S4, no clicks, no rubs, no murmurs ABD:  Flat, positive bowel sounds normal in frequency in pitch, no bruits, no rebound, no guarding, no midline pulsatile mass, no hepatomegaly, no splenomegaly EXT:  2 plus pulses throughout, no edema, no cyanosis no clubbing SKIN:  No rashes no nodules NEURO:  Cranial nerves II through XII grossly intact, motor grossly intact throughout PSYCH:  Cognitively intact, oriented to person place and time  EKG:  EKG is not ordered today.  LHC 08/29/15:  Normal coronary arteries LVF 40-50%.  LVEDP 28 mmHg  RHC 08/29/15:  RA 16, RV 64/15, PA 67/19, mean PA 43, PCWP 21  Echo 08/24/15: Study Conclusions  - Left ventricle: The cavity size was moderately dilated. There was   mild concentric hypertrophy. Systolic function was normal. Wall   motion was normal; there were no regional wall motion   abnormalities. The study is not technically sufficient to allow    evaluation of LV diastolic function. - Right ventricle: The cavity size was moderately dilated. Wall   thickness was normal. Systolic function was moderately reduced. - Pulmonic valve: Not visualized. - Pulmonary arteries: Systolic pressure couldn&'t be assessed. - Inferior vena cava: Not visualized. - Pericardium, extracardiac: There was no pericardial effusion.  Impressions:  - This is a very limited quality study.  Recent Labs: 08/17/2015: B Natriuretic Peptide 589.6 08/26/2015: Magnesium 2.2 08/27/2015: ALT 18; TSH 0.659 08/31/2015: Hemoglobin 11.1; Platelets 103 09/13/2015: BUN 11; Creat 0.97; Potassium 4.2; Sodium 142    Lipid Panel    Component Value Date/Time   CHOL 149 08/27/2015 0345   TRIG 166 (H) 08/27/2015 0345   HDL 20 (L) 08/27/2015 0345   CHOLHDL 7.5 08/27/2015 0345  VLDL 33 08/27/2015 0345   LDLCALC 96 08/27/2015 0345      Wt Readings from Last 3 Encounters:  10/06/15 (!) 313 lb 3.2 oz (142.1 kg)  09/23/15 (!) 309 lb (140.2 kg)  09/15/15 (!) 311 lb (141.1 kg)      ASSESSMENT AND PLAN:  # Severe pulmonary hypertension: # Chronic systolic and diastolic heart failure: Ms. Jeon appears to be euvolemic today.  Her weight has been labile, but she was congratulated on weighing daily.  We discussed 2L fluid and 2g sodium limitations.  She will continue to weigh daily and take lasix 40 mg bid instead of daily for two days if her weight increases by 2lb in one day or by 5 lb in one week.  Pulmonary pressures are elevated, likely due to untreated OSA.  She is in the process of getting BiPAP and is wearing oxygen at bedtime now.    # Hypertension: Blood pressure is well-controlled.  Continue carvedilol and losartan.  # OSA: Continue Bipap titation as planned.   # Morbid obesity:  We discussed the importance of increasing her physical activity and continuing to modify her diet to lose weight.  She expressed understanding and plans to stop drinking regular  sodas.   Current medicines are reviewed at length with the patient today.  The patient does not have concerns regarding medicines.  The following changes have been made:  no change  Labs/ tests ordered today include:  No orders of the defined types were placed in this encounter.    Disposition:   FU with Felder Lebeda C. Duke Salvia, MD, [facility] in 3 months.   This note was written with the assistance of speech recognition software.  Please excuse any transcriptional errors.  Signed, Chriss Redel C. Duke Salvia, MD, [facility]  10/06/2015 11:33 AM    [facility]

## 2015-10-10 ENCOUNTER — Telehealth: Payer: Self-pay | Admitting: Cardiovascular Disease

## 2015-10-10 NOTE — Telephone Encounter (Signed)
Patient states her significant other tested for positive for TB skin test today Patient's significant other has not had CXR to confirm   Patient had negative TB skin test a few months ago but has been in contact with significant other  Patient and her partner had testing done at [facility] patient to contact PCP for advice on how she should proceed as I am unfamiliar this this, regarding potential exposure, and do not want to advise her incorrectly.   Routed to Dr. Duke Salvia as Lorain Childes

## 2015-10-10 NOTE — Telephone Encounter (Signed)
New message       Pt would like nurse to call. Pt would not disclose any info. Please call.

## 2015-10-20 ENCOUNTER — Telehealth: Payer: Self-pay | Admitting: Cardiovascular Disease

## 2015-10-20 NOTE — Telephone Encounter (Signed)
New Message  Pt call requesting to speak with RN. Pt ask if RN could fax over paperwork to show her hospitalization from June to July to her Child psychotherapist.  Social Worker: Ms Morehead [phone] Fax [phone]

## 2015-10-20 NOTE — Telephone Encounter (Signed)
Spoke with pt, aware discharge summary faxed to the number provided.

## 2015-10-23 ENCOUNTER — Ambulatory Visit (HOSPITAL_BASED_OUTPATIENT_CLINIC_OR_DEPARTMENT_OTHER): Payer: Self-pay

## 2015-10-31 ENCOUNTER — Telehealth: Payer: Self-pay | Admitting: Physician Assistant

## 2015-10-31 NOTE — Telephone Encounter (Signed)
She saw Jennifer Owens is a 45 year old female with past medical history of chronic diastolic and systolic heart failure. She called after hour answering service for swelling of the left foot. She did increase her Lasix for 2 days with improvement however and the swelling came back. She says it is only on one side, it feels warm compared to the right side, it is also tender on the bottom. I have recommended her to continue the previous instruction to take increase the dose of Lasix for worsening swelling, however she needed to be seen by her PCP to rule out other things such as gout versus cellulitis.  Ramond Dial PA Pager: [phone]

## 2015-11-01 ENCOUNTER — Encounter (HOSPITAL_BASED_OUTPATIENT_CLINIC_OR_DEPARTMENT_OTHER): Payer: Self-pay

## 2015-11-20 ENCOUNTER — Emergency Department (HOSPITAL_COMMUNITY): Payer: Self-pay

## 2015-11-20 ENCOUNTER — Encounter (HOSPITAL_COMMUNITY): Payer: Self-pay

## 2015-11-20 ENCOUNTER — Emergency Department (HOSPITAL_COMMUNITY)
Admission: EM | Admit: 2015-11-20 | Discharge: 2015-11-20 | Disposition: A | Discharge status: Home / Self Care | Payer: Self-pay | Attending: Emergency Medicine | Admitting: Emergency Medicine

## 2015-11-20 ENCOUNTER — Telehealth: Payer: Self-pay | Admitting: Student

## 2015-11-20 DIAGNOSIS — M79671 Pain in right foot: Secondary | ICD-10-CM

## 2015-11-20 DIAGNOSIS — I5042 Chronic combined systolic (congestive) and diastolic (congestive) heart failure: Secondary | ICD-10-CM | POA: Insufficient documentation

## 2015-11-20 DIAGNOSIS — J45909 Unspecified asthma, uncomplicated: Secondary | ICD-10-CM | POA: Insufficient documentation

## 2015-11-20 DIAGNOSIS — I110 Hypertensive heart disease with heart failure: Secondary | ICD-10-CM | POA: Insufficient documentation

## 2015-11-20 DIAGNOSIS — Z87891 Personal history of nicotine dependence: Secondary | ICD-10-CM | POA: Insufficient documentation

## 2015-11-20 DIAGNOSIS — Z79899 Other long term (current) drug therapy: Secondary | ICD-10-CM | POA: Insufficient documentation

## 2015-11-20 DIAGNOSIS — R600 Localized edema: Secondary | ICD-10-CM | POA: Insufficient documentation

## 2015-11-20 DIAGNOSIS — Z955 Presence of coronary angioplasty implant and graft: Secondary | ICD-10-CM | POA: Insufficient documentation

## 2015-11-20 MED ORDER — OXYCODONE-ACETAMINOPHEN 5-325 MG PO TABS
1.0000 | ORAL_TABLET | Freq: Once | ORAL | Status: AC
  Administered 2015-11-20: 1 via ORAL
  Filled 2015-11-20: qty 1

## 2015-11-20 MED ORDER — OXYCODONE-ACETAMINOPHEN 5-325 MG PO TABS: 1.0000 | ORAL_TABLET | Freq: Four times a day (QID) | ORAL | 0 refills | Status: DC | PRN

## 2015-11-20 NOTE — ED Triage Notes (Signed)
Pt. Coming from home via [facility] for right foot pain x2 days. Pt. Recent CHF diagnosis and thinks this is edema. Pt. Unable to stand anything touching the extremity. Pt. Has been using crutches to get around her house x25mon. Pt. Aox4.

## 2015-11-20 NOTE — ED Notes (Signed)
Pt returns from xray

## 2015-11-20 NOTE — ED Notes (Signed)
EDP at bedside  

## 2015-11-20 NOTE — Telephone Encounter (Signed)
    Patient reports her right foot is swollen and hot to touch. She is in intense pain and can barely walk from one room to another.   Says the erythema has progressed over the past 3-4 days and pain is now radiating into her calf. She does not have an appointment with her PCP until October. I recommended she be seen at an Urgent Care or come to the ED, as the symptoms she describes are concerning for cellulitis.   Signed, Ellsworth Lennox, PA-C 11/20/2015, 10:16 AM

## 2015-11-20 NOTE — ED Provider Notes (Signed)
[facility]-EMERGENCY DEPT Provider Note   CSN: [id number] Arrival date & time: 11/20/15  1158     History   Chief Complaint Chief Complaint  Patient presents with  . Foot Pain    right    HPI Jennifer Owens is a 45 y.o. female.  Patient with history of heart failure on Lasix, hypertension, obesity, cardiomyopathy -- presents with complaint of right foot pain. Patient had similar pain in her left foot in the past due to edema, or at least improved with improvement in edema. Pain is been ongoing for approximately 3 days. Patient has been using crutches to get around. She has pain on the lateral aspect of her foot and on the bottom of her foot. No redness or fevers. Bearing weight makes the pain worse. No associated chest pain or shortness of breath. She has not increased her Lasix, elevated her feet, or applied compression. She has been taking Tylenol for pain without relief. The onset of this condition was acute. The course is constant.   Cardiology advised patient to come to the emergency department for evaluation of cellulitis. Telephone note reviewed.      Past Medical History:  Diagnosis Date  . Asthma   . Chronic combined systolic and diastolic CHF (congestive heart failure) (HCC)    a. Echo 5/17: mild LVH, EF 55-60%, no RWMA, Gr 1 DD, mildly dilated Ao root, trivial eff //  b. Echo 6/17: mild conc LVH, EF 35-40%, RVE with mod RV dysfunction  . Hypertensive heart disease with CHF (congestive heart failure) (HCC)   . Moderate to severe pulmonary hypertension (HCC)    a RHC 7/17 with PAP 76/30  . Morbid obesity (HCC)   . NICM (nonischemic cardiomyopathy) (HCC)    a. LHC 7/17 with normal cors  . Sciatica   . Thrombocytopenia (HCC)   . Tobacco abuse     Patient Active Problem List   Diagnosis Date Noted  . NICM (nonischemic cardiomyopathy) (HCC) 09/06/2015  . Pulmonary hypertension (HCC)   . Chronic combined systolic and diastolic heart failure (HCC)   . Morbid obesity (HCC)    . Hypertensive heart disease 08/17/2015  . Hypoxia 07/22/2015  . Asthma 07/22/2015  . Thrombocytopenia (HCC) 07/22/2015  . Tobacco abuse 07/22/2015    Past Surgical History:  Procedure Laterality Date  . APPENDECTOMY    . CARDIAC CATHETERIZATION N/A 08/29/2015   Procedure: Right/Left Heart Cath and Coronary Angiography;  Surgeon: [name], MD;  Location: [facility] INVASIVE CV LAB;  Service: Cardiovascular;  Laterality: N/A;  . CESAREAN SECTION    . CHOLECYSTECTOMY    . SMALL INTESTINE SURGERY      OB History    No data available       Home Medications    Prior to Admission medications   Medication Sig Start Date End Date Taking? Authorizing Provider  acetaminophen (TYLENOL) 500 MG tablet Take 1,000 mg by mouth every 4 (four) hours as needed for moderate pain or headache.   Yes Historical Provider, MD  albuterol (PROVENTIL HFA;VENTOLIN HFA) 108 (90 Base) MCG/ACT inhaler Inhale 2 puffs into the lungs every 6 (six) hours as needed for wheezing or shortness of breath. 08/31/15  Yes Shanker Levora Dredge, MD  carvedilol (COREG) 6.25 MG tablet Take 1 tablet (6.25 mg total) by mouth 2 (two) times daily with a meal. 09/29/15  Yes Scott T Alben Spittle, PA-C  furosemide (LASIX) 40 MG tablet Take 1 tablet (40 mg total) by mouth daily. 09/29/15  Yes Scott  T Weaver, PA-C  losartan (COZAAR) 25 MG tablet Take 1 tablet (25 mg total) by mouth daily. 09/29/15  Yes Beatrice Lecher, PA-C  potassium chloride SA (K-DUR,KLOR-CON) 20 MEQ tablet Take 1 tablet (20 mEq total) by mouth 2 (two) times daily. 09/29/15  Yes Beatrice Lecher, PA-C    Family History Family History  Problem Relation Age of Onset  . Diabetes Mellitus II Mother   . Hypertension Mother   . Diabetes Mellitus II Father     Social History Social History  Substance Use Topics  . Smoking status: Former Smoker    Packs/day: 0.25    Years: 10.00    Types: Cigarettes    Quit date: 07/2015  . Smokeless tobacco: Never Used  . Alcohol use No      Comment: very rarely - 1-2 drinks/month     Allergies   Review of patient's allergies indicates no known allergies.   Review of Systems Review of Systems  Constitutional: Negative for fever.  HENT: Negative for rhinorrhea and sore throat.   Eyes: Negative for redness.  Respiratory: Negative for cough.   Cardiovascular: Positive for leg swelling. Negative for chest pain.  Gastrointestinal: Negative for abdominal pain, diarrhea, nausea and vomiting.  Genitourinary: Negative for dysuria.  Musculoskeletal: Positive for arthralgias and myalgias.  Skin: Negative for rash.  Neurological: Negative for headaches.     Physical Exam Updated Vital Signs BP 143/93 (BP Location: Right Arm)   Pulse 95   Temp 98.9 F (37.2 C) (Oral)   Resp 20   Ht 5\' 3"  (1.6 m)   Wt (!) 140.6 kg   SpO2 100%   BMI 54.91 kg/m   Physical Exam  Constitutional: She appears well-developed and well-nourished.  HENT:  Head: Normocephalic and atraumatic.  Eyes: Conjunctivae are normal.  Neck: Normal range of motion. Neck supple.  Pulmonary/Chest: No respiratory distress.  Musculoskeletal:       Right knee: Normal.       Right ankle: Normal.       Right lower leg: Normal.       Right foot: There is decreased range of motion, tenderness, bony tenderness and swelling. There is no deformity.       Feet:  Neurological: She is alert.  Skin: Skin is warm and dry.  Psychiatric: She has a normal mood and affect.  Nursing note and vitals reviewed.    ED Treatments / Results   Radiology Dg Foot Complete Right  Result Date: 11/20/2015 CLINICAL DATA:  Right foot pain and swelling, greater laterally. No known injury. EXAM: RIGHT FOOT COMPLETE - 3+ VIEW COMPARISON:  None. FINDINGS: Diffuse soft tissue swelling, most pronounced dorsally and laterally. Mild calcaneal spur formation and mild degenerative changes at the articulation of the navicular and medial cuneiform. No bone destruction or periosteal reaction.  No soft tissue gas. IMPRESSION: Soft tissue swelling without acute underlying bony abnormality. Mild degenerative changes. Electronically Signed   By: Beckie Salts M.D.   On: 11/20/2015 13:44    Procedures Procedures (including critical care time)  Medications Ordered in ED Medications  oxyCODONE-acetaminophen (PERCOCET/ROXICET) 5-325 MG per tablet 1 tablet (1 tablet Oral Given 11/20/15 1357)     Initial Impression / Assessment and Plan / ED Course  I have reviewed the triage vital signs and the nursing notes.  Pertinent labs & imaging results that were available during my care of the patient were reviewed by me and considered in my medical decision making (see chart for details).  Clinical Course   Patient seen and examined. Work-up initiated. Medications ordered.   Vital signs reviewed and are as follows: BP 143/93 (BP Location: Right Arm)   Pulse 95   Temp 98.9 F (37.2 C) (Oral)   Resp 20   Ht 5\' 3"  (1.6 m)   Wt (!) 140.6 kg   SpO2 100%   BMI 54.91 kg/m   2:36 PM patient updated on x-ray results. Will provide with Ace wrap and give ortho/PCP follow-up. Patient given Percocet to use at home if pain is worse. Encouraged her to elevate the extremity as much as possible. Told her that she can adjust her Lasix based on instructions from her cardiologist.  Pt urged to return with worsening pain, worsening swelling, expanding area of redness or streaking up extremity, fever, or any other concerns. Counseled to take pain medications as prescribed. Pt verbalizes understanding and agrees with plan.   Final Clinical Impressions(s) / ED Diagnoses   Final diagnoses:  Foot pain, right   Patient with foot pain, bilateral pedal edema. No redness or swelling suggestive of cellulitis. Lower suspicion for gout but is on the differential. Possible plantar fasciitis, or pain and swelling related to osteoarthritis. No concern for infection. No calf tenderness or swelling suggestive of DVT. At  this time, treatment modalities will be conservative in nature with follow-up as needed.  New Prescriptions New Prescriptions   OXYCODONE-ACETAMINOPHEN (PERCOCET/ROXICET) 5-325 MG TABLET    Take 1-2 tablets by mouth every 6 (six) hours as needed for severe pain.     Renne CriglerJoshua Lakeisa Heninger, PA-C 11/20/15 1439    Lyndal Pulleyaniel Knott, MD 11/20/15 [phone]

## 2015-11-20 NOTE — Discharge Instructions (Signed)
Please read and follow all provided instructions.  Your diagnoses today include:  1. Foot pain, right     Tests performed today include:  An x-ray of the affected area - does NOT show any broken bones  Vital signs. See below for your results today.   Medications prescribed:   Percocet (oxycodone/acetaminophen) - narcotic pain medication  DO NOT drive or perform any activities that require you to be awake and alert because this medicine can make you drowsy. BE VERY CAREFUL not to take multiple medicines containing Tylenol (also called acetaminophen). Doing so can lead to an overdose which can damage your liver and cause liver failure and possibly death.  Take any prescribed medications only as directed.  Home care instructions:   Follow any educational materials contained in this packet  Follow R.I.C.E. Protocol:  R - rest your injury   I  - use ice on injury without applying directly to skin  C - compress injury with bandage or splint  E - elevate the injury as much as possible  Follow-up instructions: Please follow-up with your primary care provider or the provided orthopedic physician (bone specialist) if you continue to have significant pain in 1 week. In this case you may have a more severe injury that requires further care.   Return instructions:   Please return if your toes or feet are numb or tingling, appear gray or blue, or you have severe pain (also elevate the leg and loosen splint or wrap if you were given one)  Please return to the Emergency Department if you experience worsening symptoms.   Please return if you have any other emergent concerns.  Additional Information:  Your vital signs today were: BP 143/93 (BP Location: Right Arm)    Pulse 95    Temp 98.9 F (37.2 C) (Oral)    Resp 20    Ht 5\' 3"  (1.6 m)    Wt (!) 140.6 kg    SpO2 100%    BMI 54.91 kg/m  If your blood pressure (BP) was elevated above 135/85 this visit, please have this repeated by  your doctor within one month. --------------

## 2015-11-28 ENCOUNTER — Ambulatory Visit (INDEPENDENT_AMBULATORY_CARE_PROVIDER_SITE_OTHER): Payer: Self-pay | Admitting: Family Medicine

## 2015-11-28 ENCOUNTER — Encounter: Payer: Self-pay | Admitting: Family Medicine

## 2015-11-28 VITALS — BP 147/87 | HR 85 | Temp 98.0°F | Resp 18 | Ht 63.0 in | Wt 306.0 lb

## 2015-11-28 DIAGNOSIS — Z131 Encounter for screening for diabetes mellitus: Secondary | ICD-10-CM

## 2015-11-28 DIAGNOSIS — Z114 Encounter for screening for human immunodeficiency virus [HIV]: Secondary | ICD-10-CM

## 2015-11-28 DIAGNOSIS — Z23 Encounter for immunization: Secondary | ICD-10-CM

## 2015-11-28 NOTE — Progress Notes (Signed)
Jennifer Owens, is a 45 y.o. female  CWU:[id number]  TUU:[id number]  DOB - 1970-09-18  CC:  Chief Complaint  Patient presents with  . Foot Pain    RIGHT FOOT PAIN/SWELLING        HPI: Jennifer Owens is a 45 y.o. female here for follow-up foot pain. She was seen recently in ED and x-ray showed arthritic changes and well as some soft tissue. We was prescribed an ace bandage, percocet and told to follow-up here. She reports she continues to have significant pain and soreness. Is currently walking with crutches. She is wearing flip-flop type shoes. She has chronic heart issues followed by cardiology.   Health Maintenance: Needs influenza, reports Tdap up to date. Needs PAP, mammogram and HIV screening.   No Known Allergies Past Medical History:  Diagnosis Date  . Asthma   . Chronic combined systolic and diastolic CHF (congestive heart failure) (HCC)    a. Echo 5/17: mild LVH, EF 55-60%, no RWMA, Gr 1 DD, mildly dilated Ao root, trivial eff //  b. Echo 6/17: mild conc LVH, EF 35-40%, RVE with mod RV dysfunction  . Hypertensive heart disease with CHF (congestive heart failure) (HCC)   . Moderate to severe pulmonary hypertension    a RHC 7/17 with PAP 76/30  . Morbid obesity (HCC)   . NICM (nonischemic cardiomyopathy) (HCC)    a. LHC 7/17 with normal cors  . Sciatica   . Thrombocytopenia (HCC)   . Tobacco abuse    Current Outpatient Prescriptions on File Prior to Visit  Medication Sig Dispense Refill  . acetaminophen (TYLENOL) 500 MG tablet Take 1,000 mg by mouth every 4 (four) hours as needed for moderate pain or headache.    . albuterol (PROVENTIL HFA;VENTOLIN HFA) 108 (90 Base) MCG/ACT inhaler Inhale 2 puffs into the lungs every 6 (six) hours as needed for wheezing or shortness of breath. 1 Inhaler 0  . carvedilol (COREG) 6.25 MG tablet Take 1 tablet (6.25 mg total) by mouth 2 (two) times daily with a meal. 180 tablet 3  . furosemide (LASIX) 40 MG tablet Take 1 tablet (40 mg total) by  mouth daily. 90 tablet 3  . losartan (COZAAR) 25 MG tablet Take 1 tablet (25 mg total) by mouth daily. 90 tablet 3  . oxyCODONE-acetaminophen (PERCOCET/ROXICET) 5-325 MG tablet Take 1-2 tablets by mouth every 6 (six) hours as needed for severe pain. 10 tablet 0  . potassium chloride SA (K-DUR,KLOR-CON) 20 MEQ tablet Take 1 tablet (20 mEq total) by mouth 2 (two) times daily. 180 tablet 3   No current facility-administered medications on file prior to visit.    Family History  Problem Relation Age of Onset  . Diabetes Mellitus II Mother   . Hypertension Mother   . Diabetes Mellitus II Father    Social History   Social History  . Marital status: Legally Separated    Spouse name: N/A  . Number of children: N/A  . Years of education: N/A   Occupational History  . 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 Smoking status: Former Smoker    Packs/day: 0.25    Years: 10.00    Types: Cigarettes    Quit date: 07/2015  . Smokeless tobacco: Never Used  . Alcohol use No     Comment: very rarely - 1-2 drinks/month  . Drug use: No  . Sexual activity: Not on file   Other Topics Concern  . Not  on file   Social History Narrative  . No narrative on file    Review of Systems: Constitutional: Negative Skin: Negative HENT: Negative  Eyes: Negative  Neck: Negative Respiratory: Negative Cardiovascular: Negative Gastrointestinal: Negative Genitourinary: Negative  Musculoskeletal: Positive for bilateral inferior heel pain.  Neurological: Negative for Hematological: Negative  Psychiatric/Behavioral: Negative    Objective:   Vitals:   11/28/15 1356  BP: (!) 147/87  Pulse: 85  Resp: 18  Temp: 98 F (36.7 C)    Physical Exam: Constitutional: Patient appears well-developed and well-nourished. No distress. HENT: Normocephalic, atraumatic, External right and left ear normal. Oropharynx is clear and moist.  Eyes: Conjunctivae and EOM are normal. PERRLA,  no scleral icterus. Neck: Normal ROM. Neck supple. No lymphadenopathy, No thyromegaly. CVS: RRR, S1/S2 +, no murmurs, no gallops, no rubs Pulmonary: Effort and breath sounds normal, no stridor, rhonchi, wheezes, rales.  Abdominal: Soft. Normoactive BS,, no distension, tenderness, rebound or guarding.  Musculoskeletal: Normal range of motion. No edema and no tenderness.  Neuro: Alert.Normal muscle tone coordination. Non-focal Skin: Skin is warm and dry. No rash noted. Not diaphoretic. No erythema. No pallor. Psychiatric: Normal mood and affect. Behavior, judgment, thought content normal.  Lab Results  Component Value Date   WBC 6.2 08/31/2015   HGB 11.1 (L) 08/31/2015   HCT 35.0 (L) 08/31/2015   MCV 96.2 08/31/2015   PLT 103 (L) 08/31/2015   Lab Results  Component Value Date   CREATININE 0.97 09/13/2015   BUN 11 09/13/2015   NA 142 09/13/2015   K 4.2 09/13/2015   CL 101 09/13/2015   CO2 32 (H) 09/13/2015    No results found for: HGBA1C Lipid Panel     Component Value Date/Time   CHOL 149 08/27/2015 0345   TRIG 166 (H) 08/27/2015 0345   HDL 20 (L) 08/27/2015 0345   CHOLHDL 7.5 08/27/2015 0345   VLDL 33 08/27/2015 0345   LDLCALC 96 08/27/2015 0345       Assessment and plan:   1. Need for prophylactic vaccination and inoculation against influenza  - Flu Vaccine QUAD 36+ mos PF IM (Fluarix & Fluzone Quad PF)  2. Screening for diabetes mellitus  - Hemoglobin A1c  3. Screening for HIV (human immunodeficiency virus)  - HIV antibody (with reflex)  4. Need for mammogram -Has been ordered, call about scholarship  5. Need for PAP -return as planned in December.   6. Foot pain, probable plantar facitis. -Have discussed sympotomatic treatment and provided printed information --Will refer to podiatrist when financial resources available.   Return for Already has an appt in December.  The patient was given clear instructions to go to ER or return to medical center  if symptoms don't improve, worsen or new problems develop. The patient verbalized understanding.    Henrietta HooverLinda C Shalandria Elsbernd FNP  11/28/2015, 2:35 PM

## 2015-11-28 NOTE — Patient Instructions (Addendum)
Ice to feet as we discussed several times a day for 10-15 minutes Let me know as soon as you have insurance so I can send you to a foot doctor. Try to wear good, supportive, well padded shoes.  Plantar Fasciitis Plantar fasciitis is a painful foot condition that affects the heel. It occurs when the band of tissue that connects the toes to the heel bone (plantar fascia) becomes irritated. This can happen after exercising too much or doing other repetitive activities (overuse injury). The pain from plantar fasciitis can range from mild irritation to severe pain that makes it difficult for you to walk or move. The pain is usually worse in the morning or after you have been sitting or lying down for a while. CAUSES This condition may be caused by:  Standing for long periods of time.  Wearing shoes that do not fit.  Doing high-impact activities, including running, aerobics, and ballet.  Being overweight.  Having an abnormal way of walking (gait).  Having tight calf muscles.  Having high arches in your feet.  Starting a new athletic activity. SYMPTOMS The main symptom of this condition is heel pain. Other symptoms include:  Pain that gets worse after activity or exercise.  Pain that is worse in the morning or after resting.  Pain that goes away after you walk for a few minutes. DIAGNOSIS This condition may be diagnosed based on your signs and symptoms. Your health care provider will also do a physical exam to check for:  A tender area on the bottom of your foot.  A high arch in your foot.  Pain when you move your foot.  Difficulty moving your foot. You may also need to have imaging studies to confirm the diagnosis. These can include:  X-rays.  Ultrasound.  MRI. TREATMENT  Treatment for plantar fasciitis depends on the severity of the condition. Your treatment may include:  Rest, ice, and over-the-counter pain medicines to manage your pain.  Exercises to stretch your  calves and your plantar fascia.  A splint that holds your foot in a stretched, upward position while you sleep (night splint).  Physical therapy to relieve symptoms and prevent problems in the future.  Cortisone injections to relieve severe pain.  Extracorporeal shock wave therapy (ESWT) to stimulate damaged plantar fascia with electrical impulses. It is often used as a last resort before surgery.  Surgery, if other treatments have not worked after 12 months. HOME CARE INSTRUCTIONS  Take medicines only as directed by your health care provider.  Avoid activities that cause pain.  Roll the bottom of your foot over a bag of ice or a bottle of cold water. Do this for 20 minutes, 3-4 times a day.  Perform simple stretches as directed by your health care provider.  Try wearing athletic shoes with air-sole or gel-sole cushions or soft shoe inserts.  Wear a night splint while sleeping, if directed by your health care provider.  Keep all follow-up appointments with your health care provider. PREVENTION   Do not perform exercises or activities that cause heel pain.  Consider finding low-impact activities if you continue to have problems.  Lose weight if you need to. The best way to prevent plantar fasciitis is to avoid the activities that aggravate your plantar fascia. SEEK MEDICAL CARE IF:  Your symptoms do not go away after treatment with home care measures.  Your pain gets worse.  Your pain affects your ability to move or do your daily activities.   This  information is not intended to replace advice given to you by your health care provider. Make sure you discuss any questions you have with your health care provider.   Document Released: 11/07/2000 Document Revised: 11/03/2014 Document Reviewed: 12/23/2013 Elsevier Interactive Patient Education [employer]

## 2015-11-29 LAB — HEMOGLOBIN A1C
HEMOGLOBIN A1C: 5.1 % (ref <5.7)
MEAN PLASMA GLUCOSE: 100 mg/dL

## 2015-11-29 LAB — HIV ANTIBODY (ROUTINE TESTING W REFLEX): HIV: NONREACTIVE

## 2015-11-30 ENCOUNTER — Other Ambulatory Visit: Payer: Self-pay | Admitting: *Deleted

## 2015-11-30 NOTE — Telephone Encounter (Signed)
See phone note from Azalee Course looks like he is the one who told pt to double up. Send message to [facility] please.

## 2015-11-30 NOTE — Telephone Encounter (Signed)
Hello,  Patient is stating that she only has 8-9 days left on her  RX and the pharmacy isn't allowing her to refill. She states that she could double up due to swelling. Please advise

## 2015-12-05 MED ORDER — FUROSEMIDE 40 MG PO TABS: 40.0000 mg | ORAL_TABLET | Freq: Every day | ORAL | 3 refills | Status: DC

## 2015-12-05 NOTE — Telephone Encounter (Signed)
It was Dr. Duke Salvia to told her to double up on lasix for 2 days if her swelling gets worse, I told her to follow what Dr. Leonides Sake recommendation and schedule followup with her PCP because it did not make sense from heart failure perspective that she was swollen only on one side. I said need eval for gout vs cellulitis. But yes, she is suppose to be on lasix daily and only double up if has increased swelling. Looks like she called [place] later for the same thing who asked her to go to urgent care to evaluate for cellulitis. If she is still having swelling, she needs an closer office visit

## 2015-12-18 ENCOUNTER — Ambulatory Visit (HOSPITAL_BASED_OUTPATIENT_CLINIC_OR_DEPARTMENT_OTHER): Payer: Self-pay | Attending: Cardiology | Admitting: Cardiology

## 2015-12-18 VITALS — Ht 63.5 in | Wt 306.0 lb

## 2015-12-18 DIAGNOSIS — G4736 Sleep related hypoventilation in conditions classified elsewhere: Secondary | ICD-10-CM | POA: Insufficient documentation

## 2015-12-18 DIAGNOSIS — G4733 Obstructive sleep apnea (adult) (pediatric): Secondary | ICD-10-CM | POA: Insufficient documentation

## 2015-12-18 DIAGNOSIS — R0902 Hypoxemia: Secondary | ICD-10-CM

## 2015-12-18 DIAGNOSIS — I493 Ventricular premature depolarization: Secondary | ICD-10-CM | POA: Insufficient documentation

## 2015-12-18 DIAGNOSIS — Z79899 Other long term (current) drug therapy: Secondary | ICD-10-CM | POA: Insufficient documentation

## 2015-12-21 NOTE — Procedures (Signed)
   Patient Name: Jennifer Owens, Jennifer Owens Date: 12/18/2015 Gender: Female D.O.B: 04-Feb-1971 Age (years): 45 Referring Provider: [name] Height (inches): 63 Interpreting Physician: Armanda Magic MD, ABSM Weight (lbs): 311 RPSGT: Cherylann Parr BMI: 55 MRN: [id number] Neck Size: 16.00  CLINICAL INFORMATION The patient is referred for a BiPAP titration to treat sleep apnea. Date of NPSG, Split Night or HST:09/15/2015  SLEEP STUDY TECHNIQUE As per the AASM Manual for the Scoring of Sleep and Associated Events v2.3 (April 2016) with a hypopnea requiring 4% desaturations. The channels recorded and monitored were frontal, central and occipital EEG, electrooculogram (EOG), submentalis EMG (chin), nasal and oral airflow, thoracic and abdominal wall motion, anterior tibialis EMG, snore microphone, electrocardiogram, and pulse oximetry. Bilevel positive airway pressure (BPAP) was initiated at the beginning of the study and titrated to treat sleep-disordered breathing.  MEDICATIONS Medications self-administered by patient taken the night of the study : CARVEDILOL  RESPIRATORY PARAMETERS Optimal IPAP Pressure (cm): 19  AHI at Optimal Pressure (/hr) 0.0 Optimal EPAP Pressure (cm):15   Overall Minimal O2 (%):76.00  Minimal O2 at Optimal Pressure (%): 85.  SLEEP ARCHITECTURE Start Time:10:59:48 PM Stop Time:5:02:04 AM  Total Time (min):362.3 Total Sleep Time (min):144.0 Sleep Latency (min):30.7 Sleep Efficiency (%):39.7 REM Latency (min):291.5 WASO (min):187.5 Stage N1 (%):17.71  Stage N2 (%):70.14  Stage N3 (%):0.00  Stage R (%):12.15 Supine (%):21.18  Arousal Index (/hr):19.2      CARDIAC DATA The 2 lead EKG demonstrated sinus rhythm. The mean heart rate was 80.36 beats per minute. Other EKG findings include: PVCs.  LEG MOVEMENT DATA The total Periodic Limb Movements of Sleep (PLMS) were 0. The PLMS index was 0.00. A PLMS index of <15 is considered normal in adults.  IMPRESSIONS - An  optimal PAP pressure was selected for this patient ( 19/15 cm of water) - Central sleep apnea was not noted during this titration (CAI = 0.0/h). - Severe oxygen desaturations were observed during this titration (min O2 = 76.00%). - No snoring was audible during this study. - PVCs were observed during this study. - Clinically significant periodic limb movements were not noted during this study. Arousals associated with PLMs were rare.  DIAGNOSIS - Obstructive Sleep Apnea (327.23 [G47.33 ICD-10]) - Nocturnal hypoxemia  RECOMMENDATIONS - Trial of BiPAP therapy on 19/15 cm H2O with a Small size Fisher&Paykel Nasal Mask Eson mask and heated humidification. - Avoid alcohol, sedatives and other CNS depressants that may worsen sleep apnea and disrupt normal sleep architecture. - Sleep hygiene should be reviewed to assess factors that may improve sleep quality. - Weight management and regular exercise should be initiated or continued. - Return to Sleep Center for re-evaluation after 10 weeks of therapy - Overnight pulse oximetry to determine if supplemental oxygen is needed.  Armanda Magic Diplomate, American Board of Sleep Medicine  ELECTRONICALLY SIGNED ON:  12/21/2015, 12:28 PM [facility] PH: [phone]   FX: [phone] ACCREDITED BY THE AMERICAN ACADEMY OF SLEEP MEDICINE

## 2015-12-22 NOTE — Addendum Note (Signed)
Addended by: Lenis Dickinson on: 12/22/2015 11:13 AM   Modules accepted: Orders

## 2015-12-22 NOTE — Addendum Note (Signed)
Addended by: Lenis Dickinson on: 12/22/2015 11:11 AM   Modules accepted: Orders

## 2015-12-22 NOTE — Progress Notes (Signed)
Patient informed of results and the need for the ONO on BiPAP.  Orders placed and message sent to [facility].   Once patient is set up, we will schedule 10 week follow-up.

## 2016-01-16 ENCOUNTER — Ambulatory Visit: Payer: Self-pay | Admitting: Cardiovascular Disease

## 2016-01-26 ENCOUNTER — Ambulatory Visit (INDEPENDENT_AMBULATORY_CARE_PROVIDER_SITE_OTHER): Payer: Self-pay | Admitting: Cardiovascular Disease

## 2016-01-26 ENCOUNTER — Encounter: Payer: Self-pay | Admitting: Cardiovascular Disease

## 2016-01-26 VITALS — BP 176/100 | HR 91 | Ht 63.0 in | Wt 312.0 lb

## 2016-01-26 DIAGNOSIS — I110 Hypertensive heart disease with heart failure: Secondary | ICD-10-CM

## 2016-01-26 DIAGNOSIS — I272 Pulmonary hypertension, unspecified: Secondary | ICD-10-CM

## 2016-01-26 DIAGNOSIS — G4733 Obstructive sleep apnea (adult) (pediatric): Secondary | ICD-10-CM

## 2016-01-26 DIAGNOSIS — I428 Other cardiomyopathies: Secondary | ICD-10-CM

## 2016-01-26 DIAGNOSIS — I5042 Chronic combined systolic (congestive) and diastolic (congestive) heart failure: Secondary | ICD-10-CM

## 2016-01-26 MED ORDER — LOSARTAN POTASSIUM 50 MG PO TABS: 50.0000 mg | ORAL_TABLET | Freq: Every day | ORAL | 3 refills | Status: DC

## 2016-01-26 MED ORDER — CARVEDILOL 12.5 MG PO TABS: 12.5000 mg | ORAL_TABLET | Freq: Two times a day (BID) | ORAL | 3 refills | Status: DC

## 2016-01-26 NOTE — Patient Instructions (Signed)
Medication Instructions:  INCREASE YOUR CARVEDILOL TO 12.5 MG TWICE A DAY  INCREASE YOUR LOSARTAN TO 50 MG DAILY  Labwork: NONE  Testing/Procedures: NONE  Follow-Up: Your physician recommends that you schedule a follow-up appointment in: 1 MONTH WITH PHARM D FOR BLOOD PRESSURE  Your physician recommends that you schedule a follow-up appointment in: 3 MONTH OV WITH DR [facility]  Any Other Special Instructions Will Be Listed Below (If Applicable). LOOK AT [facility] ONLINE TO SEE YOUR OPTIONS FOR FILLING YOUR PRESCRIPTIONS   If you need a refill on your cardiac medications before your next appointment, please call your pharmacy.

## 2016-01-26 NOTE — Progress Notes (Signed)
Cardiology Office Note   Date:  01/26/2016   ID:  Jennifer Owens, DOB 04/08/1970, MRN [id number]  PCP:  Concepcion Living, NP  Cardiologist:   Chilton Si, MD   Chief Complaint  Patient presents with  . Follow-up  . Shortness of Breath    when walking.  . Edema    in feet.     History of Present Illness: Jennifer Owens is a 45 y.o. 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  Jennifer Owens was admitted to the hospital 6/21 through 7/5 with hypoxic respiratory failure in the setting of acute systolic and diastolic heart failure and MSSA pneumonia. She required intubation and diuresis.  She was noted to have severe pulmonary hypertension.  She had an echo 08/24/15 that revealed LVEF 35-40% with a moderately reduced right ventricular function.  She then had a left heart cath on 08/29/15 that revealed no obstructive coronary disease and her ejection fraction was 40-50% with LVEDP 28 mmHg.  her right heart catheterization revealed severe pulmonary hypertension with a PA pressure of 76/30.  She underwent a CT scan that was negative for pulmonary embolism.  After discharge she underwent a sleep study that revealed moderate obstructive sleep apnea.  She was not adequately titrated on CPAP due to ongoing respiratory events so BiPAP titration was recommended.  She followed up with [name] on 7/10 and was started on losartan.  Since her last appointment, Jennifer Owens has been doing OK  Her lower extremity edema continues to improve, but she does sometimes have swelling in her feet.  She was seen for BiPAP titration.  However, she doesn't have insurance and can't afford the machine right now.  She also complains of nausea when taking her medication.  She denies chest pain and states that her shortness of breath is stable.  She denies orthopnea or PND.  She states that she has been taking her  medication as prescribed.  She isn't exercising regularly.   Past Medical History:  Diagnosis Date  . Asthma   . Chronic combined systolic and diastolic CHF (congestive heart failure) (HCC)    a. Echo 5/17: mild LVH, EF 55-60%, no RWMA, Gr 1 DD, mildly dilated Ao root, trivial eff //  b. Echo 6/17: mild conc LVH, EF 35-40%, RVE with mod RV dysfunction  . Hypertensive heart disease with CHF (congestive heart failure) (HCC)   . Moderate to severe pulmonary hypertension    a RHC 7/17 with PAP 76/30  . Morbid obesity (HCC)   . NICM (nonischemic cardiomyopathy) (HCC)    a. LHC 7/17 with normal cors  . Sciatica   . Thrombocytopenia (HCC)   . Tobacco abuse     Past Surgical History:  Procedure Laterality Date  . APPENDECTOMY    . CARDIAC CATHETERIZATION N/A 08/29/2015   Procedure: Right/Left Heart Cath and Coronary Angiography;  Surgeon: [name], MD;  Location: [facility] INVASIVE CV LAB;  Service: Cardiovascular;  Laterality: N/A;  . CESAREAN SECTION    . CHOLECYSTECTOMY    . SMALL INTESTINE SURGERY       Current Outpatient Prescriptions  Medication Sig Dispense Refill  . acetaminophen (TYLENOL) 500 MG tablet Take 1,000 mg by mouth every 4 (four) hours as needed for moderate pain or headache.    . albuterol (PROVENTIL HFA;VENTOLIN HFA) 108 (90 Base) MCG/ACT inhaler Inhale 2 puffs into the lungs every 6 (six)  hours as needed for wheezing or shortness of breath. 1 Inhaler 0  . carvedilol (COREG) 12.5 MG tablet Take 1 tablet (12.5 mg total) by mouth 2 (two) times daily with a meal. 180 tablet 3  . furosemide (LASIX) 40 MG tablet Take 1 tablet (40 mg total) by mouth daily. 90 tablet 3  . losartan (COZAAR) 50 MG tablet Take 1 tablet (50 mg total) by mouth daily. 90 tablet 3  . potassium chloride SA (K-DUR,KLOR-CON) 20 MEQ tablet Take 1 tablet (20 mEq total) by mouth 2 (two) times daily. 180 tablet 3   No current facility-administered medications for this visit.     Allergies:   Patient has  no known allergies.    Social History:  The patient  reports that she quit smoking about 5 months ago. Her smoking use included Cigarettes. She has a 2.50 pack-year smoking history. She has never used smokeless tobacco. She reports that she does not drink alcohol or use drugs.   Family History:  The patient's family history includes Diabetes Mellitus II in her father and mother; Hypertension in her mother.    ROS:  Please see the history of present illness.   Otherwise, review of systems are positive for none.   All other systems are reviewed and negative.    PHYSICAL EXAM: VS:  BP (!) 176/100   Pulse 91   Ht 5\' 3"  (1.6 m)   Wt (!) 141.5 kg (312 lb)   BMI 55.27 kg/m  , BMI Body mass index is 55.27 kg/m. GENERAL:  Well appearing.  Morbidly obese. HEENT:  Pupils equal round and reactive, fundi not visualized, oral mucosa unremarkable NECK:  No jugular venous distention, waveform within normal limits, carotid upstroke brisk and symmetric, no bruits LYMPHATICS:  No cervical adenopathy LUNGS:  Clear to auscultation bilaterally HEART:  RRR.  PMI not displaced or sustained,S1 and S2 within normal limits, no S3, no S4, no clicks, no rubs, no murmurs ABD:  Flat, positive bowel sounds normal in frequency in pitch, no bruits, no rebound, no guarding, no midline pulsatile mass, no hepatomegaly, no splenomegaly.  +RUQ tenderness to palpation.  EXT:  2 plus pulses throughout, no edema, no cyanosis no clubbing SKIN:  No rashes no nodules NEURO:  Cranial nerves II through XII grossly intact, motor grossly intact throughout PSYCH:  Cognitively intact, oriented to person place and time  EKG:  EKG is not ordered today.  LHC 08/29/15:  Normal coronary arteries LVF 40-50%.  LVEDP 28 mmHg  RHC 08/29/15:  RA 16, RV 64/15, PA 67/19, mean PA 43, PCWP 21  Echo 08/24/15: Study Conclusions  - Left ventricle: The cavity size was moderately dilated. There was   mild concentric hypertrophy. Systolic function  was normal. Wall   motion was normal; there were no regional wall motion   abnormalities. The study is not technically sufficient to allow   evaluation of LV diastolic function. - Right ventricle: The cavity size was moderately dilated. Wall   thickness was normal. Systolic function was moderately reduced. - Pulmonic valve: Not visualized. - Pulmonary arteries: Systolic pressure couldn&'t be assessed. - Inferior vena cava: Not visualized. - Pericardium, extracardiac: There was no pericardial effusion.  Impressions:  - This is a very limited quality study.  Recent Labs: 08/17/2015: B Natriuretic Peptide 589.6 08/26/2015: Magnesium 2.2 08/27/2015: ALT 18; TSH 0.659 08/31/2015: Hemoglobin 11.1; Platelets 103 09/13/2015: BUN 11; Creat 0.97; Potassium 4.2; Sodium 142    Lipid Panel    Component Value Date/Time  CHOL 149 08/27/2015 0345   TRIG 166 (H) 08/27/2015 0345   HDL 20 (L) 08/27/2015 0345   CHOLHDL 7.5 08/27/2015 0345   VLDL 33 08/27/2015 0345   LDLCALC 96 08/27/2015 0345      Wt Readings from Last 3 Encounters:  01/26/16 (!) 141.5 kg (312 lb)  12/18/15 (!) 138.8 kg (306 lb)  11/28/15 (!) 138.8 kg (306 lb)      ASSESSMENT AND PLAN:  # Severe pulmonary hypertension: # Chronic systolic and diastolic heart failure: # Hypertensive heart disease: Jennifer Owens appears to be euvolemic today.  Her weight is up 6lb since her last appointment.  We discussed 2L fluid and 2g sodium limitations.  I encouraged her to increase her lasix but she is barely able to make it to the bathroom as it is.  We wil increase carvedilol to 12.5mg  bid and losartan to 50mg  daily.  She was encouraged to get the Bipap machine when able.    # Hypertension: Blood pressure is poorly-controlled.  We will increase carvedilol to 12.5 mg bid and losartan to 50mg  daily.  She will need a BMP at follow up.  # OSA: She is unable to afford BiPAP until she gets insurance.  She is unsure of when that will happen.    # Morbid obesity:  We discussed the importance of increasing her physical activity and continuing to modify her diet to lose weight.  She expressed understanding and plans to stop drinking regular sodas.   Current medicines are reviewed at length with the patient today.  The patient does not have concerns regarding medicines.  The following changes have been made:  Increase carvedilol to 12.5mg  bid and losartan to 50mg  daily.  Labs/ tests ordered today include:  No orders of the defined types were placed in this encounter.    Disposition:   FU with Warren Kugelman C. Duke Salvia, MD, [facility] in 3 months.  She will see our pharmacist in 1 month for BP check.    This note was written with the assistance of speech recognition software.  Please excuse any transcriptional errors.  Signed, Osborne Serio C. Duke Salvia, MD, [facility]  01/26/2016 1:21 PM    [facility]

## 2016-02-13 ENCOUNTER — Encounter: Payer: Self-pay | Admitting: Family Medicine

## 2016-02-13 ENCOUNTER — Ambulatory Visit (INDEPENDENT_AMBULATORY_CARE_PROVIDER_SITE_OTHER): Payer: Self-pay | Admitting: Family Medicine

## 2016-02-13 VITALS — BP 142/78 | HR 75 | Temp 98.1°F | Resp 16 | Ht 63.0 in | Wt 319.0 lb

## 2016-02-13 DIAGNOSIS — Z0000 Encounter for general adult medical examination without abnormal findings: Secondary | ICD-10-CM

## 2016-02-13 DIAGNOSIS — R062 Wheezing: Secondary | ICD-10-CM

## 2016-02-13 LAB — CBC WITH DIFFERENTIAL/PLATELET
BASOS PCT: 1 %
Basophils Absolute: 66 cells/uL (ref 0–200)
EOS PCT: 9 %
Eosinophils Absolute: 594 cells/uL — ABNORMAL HIGH (ref 15–500)
HCT: 40.1 % (ref 35.0–45.0)
Hemoglobin: 13.2 g/dL (ref 11.7–15.5)
LYMPHS PCT: 27 %
Lymphs Abs: 1782 cells/uL (ref 850–3900)
MCH: 31.7 pg (ref 27.0–33.0)
MCHC: 32.9 g/dL (ref 32.0–36.0)
MCV: 96.2 fL (ref 80.0–100.0)
MONOS PCT: 13 %
MPV: 11.4 fL (ref 7.5–12.5)
Monocytes Absolute: 858 cells/uL (ref 200–950)
NEUTROS ABS: 3300 {cells}/uL (ref 1500–7800)
Neutrophils Relative %: 50 %
PLATELETS: 30 10*3/uL — AB (ref 140–400)
RBC: 4.17 MIL/uL (ref 3.80–5.10)
RDW: 13.8 % (ref 11.0–15.0)
WBC: 6.6 10*3/uL (ref 3.8–10.8)

## 2016-02-13 LAB — COMPLETE METABOLIC PANEL WITH GFR
ALT: 13 U/L (ref 6–29)
AST: 16 U/L (ref 10–35)
Albumin: 4.1 g/dL (ref 3.6–5.1)
Alkaline Phosphatase: 46 U/L (ref 33–115)
BILIRUBIN TOTAL: 0.4 mg/dL (ref 0.2–1.2)
BUN: 14 mg/dL (ref 7–25)
CALCIUM: 9.2 mg/dL (ref 8.6–10.2)
CO2: 29 mmol/L (ref 20–31)
CREATININE: 1.03 mg/dL (ref 0.50–1.10)
Chloride: 100 mmol/L (ref 98–110)
GFR, EST AFRICAN AMERICAN: 75 mL/min (ref >=60)
GFR, EST NON AFRICAN AMERICAN: 65 mL/min (ref >=60)
Glucose, Bld: 91 mg/dL (ref 65–99)
Potassium: 4.4 mmol/L (ref 3.5–5.3)
Sodium: 139 mmol/L (ref 135–146)
TOTAL PROTEIN: 7.5 g/dL (ref 6.1–8.1)

## 2016-02-13 MED ORDER — PREDNISONE 20 MG PO TABS: 20.0000 mg | ORAL_TABLET | Freq: Every day | ORAL | 0 refills | Status: DC

## 2016-02-13 MED FILL — ?PREDNISONE 20 MG TABLET: 20 | 5 days supply | Qty: 5 | Fill #0

## 2016-02-13 NOTE — Patient Instructions (Signed)
Make appt for PAP

## 2016-02-13 NOTE — Progress Notes (Signed)
Jennifer Owens, is a 45 y.o. female  CWU:[id number]  TUU:[id number]  DOB - 08-22-1970  CC:  Chief Complaint  Patient presents with  . Bronchitis    cold virus started 4 days ago, cough, nasal drainage, congestion, ear pain, no fever  . follow up    doing okay, waiting on Medicaid so she can receive referrals       HPI: Jennifer Owens is a 45 y.o. female here for routine 6 month follow-up. She has recently seen her cardiologist for her CHF. She also has a history of asthma.  She currently has an upper respiratory infection which started 5 days ago. She complains of ear fullness, nasal congestion and cough. Her ST has resolved. She is using her albuterol 2-3 times a day for cough and wheezing.  She continues to have bilateral heel pain and walks with crutches for that reason. She cannot currently afford an orthopedic.   No Known Allergies Past Medical History:  Diagnosis Date  . Asthma   . Chronic combined systolic and diastolic CHF (congestive heart failure) (HCC)    a. Echo 5/17: mild LVH, EF 55-60%, no RWMA, Gr 1 DD, mildly dilated Ao root, trivial eff //  b. Echo 6/17: mild conc LVH, EF 35-40%, RVE with mod RV dysfunction  . Hypertensive heart disease with CHF (congestive heart failure) (HCC)   . Moderate to severe pulmonary hypertension    a RHC 7/17 with PAP 76/30  . Morbid obesity (HCC)   . NICM (nonischemic cardiomyopathy) (HCC)    a. LHC 7/17 with normal cors  . Sciatica   . Thrombocytopenia (HCC)   . Tobacco abuse    Current Outpatient Prescriptions on File Prior to Visit  Medication Sig Dispense Refill  . acetaminophen (TYLENOL) 500 MG tablet Take 1,000 mg by mouth every 4 (four) hours as needed for moderate pain or headache.    . albuterol (PROVENTIL HFA;VENTOLIN HFA) 108 (90 Base) MCG/ACT inhaler Inhale 2 puffs into the lungs every 6 (six) hours as needed for wheezing or shortness of breath. 1 Inhaler 0  . carvedilol (COREG) 12.5 MG tablet Take 1 tablet (12.5 mg total)  by mouth 2 (two) times daily with a meal. 180 tablet 3  . furosemide (LASIX) 40 MG tablet Take 1 tablet (40 mg total) by mouth daily. 90 tablet 3  . losartan (COZAAR) 50 MG tablet Take 1 tablet (50 mg total) by mouth daily. 90 tablet 3  . potassium chloride SA (K-DUR,KLOR-CON) 20 MEQ tablet Take 1 tablet (20 mEq total) by mouth 2 (two) times daily. 180 tablet 3   No current facility-administered medications on file prior to visit.    Family History  Problem Relation Age of Onset  . Diabetes Mellitus II Mother   . Hypertension Mother   . Diabetes Mellitus II Father    Social History   Social History  . Marital status: Legally Separated    Spouse name: N/A  . Number of children: N/A  . Years of education: N/A   Occupational History  . 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 Smoking status: Former Smoker    Packs/day: 0.25    Years: 10.00    Types: Cigarettes    Quit date: 07/2015  . Smokeless tobacco: Never Used  . Alcohol use No     Comment: very rarely - 1-2 drinks/month  . Drug use: No  . Sexual activity: Not on file   Other Topics  Concern  . Not on file   Social History Narrative  . No narrative on file    Review of Systems: Constitutional: +fatigue Skin: Negative HENT: + ear pain, nasal congestion Eyes: Negative  Neck: Negative Respiratory: + for cough, wheezing, shortness of breath Cardiovascular: Negative Gastrointestinal: Negative Genitourinary: Negative  Musculoskeletal: + heel pain bilaterally Neurological: Negative for Hematological: Negative  Psychiatric/Behavioral: Negative    Objective:   Vitals:   02/13/16 0958  BP: (!) 142/78  Pulse: 75  Resp: 16  Temp: 98.1 F (36.7 C)    Physical Exam: Constitutional: Patient appears well-developed and well-nourished. No distress. HENT: Normocephalic, atraumatic, External right and left normal TMS with fluid behind Oropharynx is clear and moist.  Eyes: Conjunctivae and  EOM are normal. PERRLA, no scleral icterus. Neck: Normal ROM. Neck supple. No lymphadenopathy, No thyromegaly. CVS: RRR, S1/S2 +, no murmurs, no gallops, no rubs Pulmonary: Effort and breath sounds normal, no stridor, rhonchi, rales. Audible wheezing from upper airway. Abdominal: Soft. Normoactive BS,, no distension, tenderness, rebound or guarding.  Musculoskeletal: Normal range of motion. No pedal edema Neuro: Alert.Normal muscle tone coordination. Non-focal Skin: Skin is warm and dry. No rash noted. Not diaphoretic. No erythema. No pallor. Psychiatric: Normal mood and affect. Behavior, judgment, thought content normal.  Lab Results  Component Value Date   WBC 6.2 08/31/2015   HGB 11.1 (L) 08/31/2015   HCT 35.0 (L) 08/31/2015   MCV 96.2 08/31/2015   PLT 103 (L) 08/31/2015   Lab Results  Component Value Date   CREATININE 0.97 09/13/2015   BUN 11 09/13/2015   NA 142 09/13/2015   K 4.2 09/13/2015   CL 101 09/13/2015   CO2 32 (H) 09/13/2015    Lab Results  Component Value Date   HGBA1C 5.1 11/28/2015   Lipid Panel     Component Value Date/Time   CHOL 149 08/27/2015 0345   TRIG 166 (H) 08/27/2015 0345   HDL 20 (L) 08/27/2015 0345   CHOLHDL 7.5 08/27/2015 0345   VLDL 33 08/27/2015 0345   LDLCALC 96 08/27/2015 0345        Assessment and plan:   1. Healthcare maintenance  - COMPLETE METABOLIC PANEL WITH GFR - CBC with Differential  2. Wheezing -Prednisone 20 mg. #5, one po q day.    Return in about 6 months (around 08/13/2016). and prn.  The patient was given clear instructions to go to ER or return to medical center if symptoms don't improve, worsen or new problems develop. The patient verbalized understanding.    Jennifer HooverLinda C Owens Alviar FNP  02/13/2016, 10:26 AM

## 2016-02-29 ENCOUNTER — Telehealth: Payer: Self-pay | Admitting: Cardiovascular Disease

## 2016-02-29 DIAGNOSIS — I5042 Chronic combined systolic (congestive) and diastolic (congestive) heart failure: Secondary | ICD-10-CM

## 2016-02-29 NOTE — Telephone Encounter (Signed)
Dicussed with patient and she will get Albuterol from PCP Will wait until after blood pressure check tomorrow for refills in case med changes

## 2016-02-29 NOTE — Telephone Encounter (Signed)
°  New Prob    *STAT* If patient is at the pharmacy, call can be transferred to refill team.   1. Which medications need to be refilled? (please list name of each medication and dose if known)  losartan (COZAAR) 50 MG tablet furosemide (LASIX) 40 MG tablet potassium chloride SA (K-DUR,KLOR-CON) 20 MEQ tablet albuterol (PROVENTIL HFA;VENTOLIN HFA) 108 (90 Base) MCG/ACT inhaler  2. Which pharmacy/location (including street and city if local pharmacy) is medication to be sent to? [facility] @ [address] [phone]  IN THE PAST MEDICATIONS HAVE BEEN SENT TO [facility] AND [facility]. HOWEVER, PT IS REQUESTING ALL MEDICATIONS BE SENT TO [facility] MOVING FORWARD.  3. Do they need a 30 day or 90 day supply? 30 days

## 2016-03-01 ENCOUNTER — Ambulatory Visit (INDEPENDENT_AMBULATORY_CARE_PROVIDER_SITE_OTHER): Payer: Self-pay | Admitting: Pharmacist

## 2016-03-01 VITALS — BP 132/84 | HR 73

## 2016-03-01 DIAGNOSIS — I110 Hypertensive heart disease with heart failure: Secondary | ICD-10-CM

## 2016-03-01 MED ORDER — CARVEDILOL 12.5 MG PO TABS: 12.5000 mg | ORAL_TABLET | Freq: Two times a day (BID) | ORAL | 3 refills | Status: DC

## 2016-03-01 MED ORDER — POTASSIUM CHLORIDE CRYS ER 20 MEQ PO TBCR: 20.0000 meq | EXTENDED_RELEASE_TABLET | Freq: Two times a day (BID) | ORAL | 3 refills | Status: DC

## 2016-03-01 MED ORDER — LOSARTAN POTASSIUM 50 MG PO TABS: 50.0000 mg | ORAL_TABLET | Freq: Every day | ORAL | 3 refills | Status: DC

## 2016-03-01 MED ORDER — FUROSEMIDE 40 MG PO TABS: ORAL_TABLET | ORAL | 3 refills | Status: DC

## 2016-03-01 MED FILL — ?FUROSEMIDE 40 MG TABLET: 40 | 30 days supply | Qty: 45 | Fill #0

## 2016-03-01 MED FILL — POTASSIUM CL ER 20 MEQ TAB: 20 | 30 days supply | Qty: 60 | Fill #0

## 2016-03-01 MED FILL — ?LOSARTAN POTASSIUM 50 MG T: 50 | 30 days supply | Qty: 30 | Fill #0

## 2016-03-01 NOTE — Telephone Encounter (Signed)
Refilled meds as requested Did change Furosemide Rx to daily and as needed per Raquel Pharm D Left message for pharmacy to put on file until she needed, all except Furosemide

## 2016-03-01 NOTE — Patient Instructions (Addendum)
Follow up with hypertension clinic as needed.  Blood pressure today is 132/84 with pulse 74  1. Continue taking BP medication as follow:       Carvedilol 12.5mg  twice daily       Furosemide 40mg  daily and extra 40mg  if needed for swelling       Losartan 50mg  daily  2. Continue with lifestyle modifications       Decrease salt in diet       Continue limits of 2L of fluids and 2g sodium   Bring all of your meds, your BP cuff and your record of home blood pressures to your next appointment.  Exercise as you're able, try to walk approximately 30 minutes per day.  Keep salt intake to a minimum, especially watch canned and prepared boxed foods.  Eat more fresh fruits and vegetables and fewer canned items.  Avoid eating in fast food restaurants.    HOW TO TAKE YOUR BLOOD PRESSURE:  Rest 5 minutes before taking your blood pressure.   Don't smoke or drink caffeinated beverages for at least 30 minutes before.  Take your blood pressure before (not after) you eat.  Sit comfortably with your back supported and both feet on the floor (don't cross your legs).  Elevate your arm to heart level on a table or a desk.  Use the proper sized cuff. It should fit smoothly and snugly around your bare upper arm. There should be enough room to slip a fingertip under the cuff. The bottom edge of the cuff should be 1 inch above the crease of the elbow.  Ideally, take 3 measurements at one sitting and record the average.

## 2016-03-01 NOTE — Progress Notes (Signed)
132Patient ID: Jennifer Owens                 DOB: 1970/07/10                      MRN: [id number]     HPI: Jennifer Owens is a 46 y.o. female referred by Dr. Duke Owens to HTN clinic. PMH relevant  for  asthma, COPD, morbid obesity, pulmonary hypertension , HF (EF 35-40%), and HTN.  Presents to clinic today for evaluation after carvedilol and losartan dose increased on Nov/30 by cardiologist.  Denies shortness of breath, headaches, dizziness, chest pain, or lethargy.  Main complaint today is swollen feet (left more that right) and unable to fit shoes today.  Also mentioned presence of blood in nostrils every morning for the past few days (recommended to use saline nasal drops/spray or saline gel to keep nostrils moisturize and decrease morning nose bleed).  Patient is very worried about increasing furosemide dose and decreasing blood pressure too much because it may damage her kidneys and end up with dialysis. Noted patient's limited understanding of disease state. Financial concerns also expressed for medication availability.  Current HTN meds: carvedilol 12.5mg  BID, furosemide 40mg  daily, losartan 50mg   Previously tried: carvedilol 6.25mg  , hydralazine 10-25mg  q8h, losartan 25mg   BP goal: 130/80  Family History: DM-II and HTN -  Mother; DM-II Father  Social History: Former smoker, denies smokeless tobacco, denies alcohol  Diet: eat mostly at home; usually 2 meals per day and significant amount of canned food and starches.  Exercise: none at the moment. Not able to ambulate very well.  Home BP readings: none available (no home monitor)  Wt Readings from Last 3 Encounters:  02/13/16 (!) 319 lb (144.7 kg)  01/26/16 (!) 312 lb (141.5 kg)  12/18/15 (!) 306 lb (138.8 kg)   BP Readings from Last 3 Encounters:  02/13/16 (!) 142/78  01/26/16 (!) 176/100  11/28/15 (!) 147/87   Pulse Readings from Last 3 Encounters:  02/13/16 75  01/26/16 91  11/28/15 85     Past Medical History:    Diagnosis Date  . Asthma   . Chronic combined systolic and diastolic CHF (congestive heart failure) (HCC)    a. Echo 5/17: mild LVH, EF 55-60%, no RWMA, Gr 1 DD, mildly dilated Ao root, trivial eff //  b. Echo 6/17: mild conc LVH, EF 35-40%, RVE with mod RV dysfunction  . Hypertensive heart disease with CHF (congestive heart failure) (HCC)   . Moderate to severe pulmonary hypertension    a RHC 7/17 with PAP 76/30  . Morbid obesity (HCC)   . NICM (nonischemic cardiomyopathy) (HCC)    a. LHC 7/17 with normal cors  . Sciatica   . Thrombocytopenia (HCC)   . Tobacco abuse     Current Outpatient Prescriptions on File Prior to Visit  Medication Sig Dispense Refill  . acetaminophen (TYLENOL) 500 MG tablet Take 1,000 mg by mouth every 4 (four) hours as needed for moderate pain or headache.    . albuterol (PROVENTIL HFA;VENTOLIN HFA) 108 (90 Base) MCG/ACT inhaler Inhale 2 puffs into the lungs every 6 (six) hours as needed for wheezing or shortness of breath. 1 Inhaler 0  . carvedilol (COREG) 12.5 MG tablet Take 1 tablet (12.5 mg total) by mouth 2 (two) times daily with a meal. 180 tablet 3  . furosemide (LASIX) 40 MG tablet Take 1 tablet (40 mg total) by mouth daily. 90 tablet 3  .  losartan (COZAAR) 50 MG tablet Take 1 tablet (50 mg total) by mouth daily. 90 tablet 3  . potassium chloride SA (K-DUR,KLOR-CON) 20 MEQ tablet Take 1 tablet (20 mEq total) by mouth 2 (two) times daily. 180 tablet 3  . predniSONE (DELTASONE) 20 MG tablet Take 1 tablet (20 mg total) by mouth daily with breakfast. 5 tablet 0   No current facility-administered medications on file prior to visit.     No Known Allergies  Assessment/Plan:  Hypertension - BP today of 132/84 is within goal of 130/80 and the lowest it has been in 1 month. Lifestyle modifications including: decrease use of canned food, using low sodium or no-salt added alternatives, eating 3 times a day while decreasing amount of snacks and try to ambulate  more when possible discussed with patient during visit.  CMP completed 02/13/16 show K 4.4 and Scr 0.97, all within normal limits.  Noted recommendation from Dr Jennifer Owens to increase furosemide to 80mg  to improve left/fett swelling.  Patient educated about effect of hypertension and furosemide on renal function/kidneys.  Will continue losartan 50mg  daily, carvedilol 12.5mg  BID, and furosemide 40mg  daily.  Patient agreed to take additional 40mg  of furosemide when needed for leg/feet edema. Patient to follow up with Dr Jennifer Owens in February as previously scheduled and f/u with hypertension clinic as needed.   Jennifer Owens PharmD, BCPS [facility] [address] [place] [id number] 03/01/2016 3:22 PM

## 2016-03-12 ENCOUNTER — Ambulatory Visit: Payer: Self-pay | Admitting: Family Medicine

## 2016-03-29 ENCOUNTER — Ambulatory Visit: Payer: Self-pay | Admitting: Family Medicine

## 2016-04-02 MED FILL — ?LOSARTAN POTASSIUM 50 MG T: 50 MG | 30 days supply | Qty: 30 | Fill #1

## 2016-04-02 MED FILL — ?FUROSEMIDE 40 MG TABLET: 40 | 30 days supply | Qty: 45 | Fill #1

## 2016-04-02 MED FILL — POTASSIUM CL ER 20 MEQ TAB: 20 | 30 days supply | Qty: 60 | Fill #1

## 2016-04-25 ENCOUNTER — Encounter: Payer: Self-pay | Admitting: Cardiovascular Disease

## 2016-04-25 ENCOUNTER — Ambulatory Visit (INDEPENDENT_AMBULATORY_CARE_PROVIDER_SITE_OTHER): Payer: Self-pay | Admitting: Cardiovascular Disease

## 2016-04-25 VITALS — BP 132/84 | HR 75 | Ht 63.0 in | Wt 311.4 lb

## 2016-04-25 DIAGNOSIS — G4733 Obstructive sleep apnea (adult) (pediatric): Secondary | ICD-10-CM

## 2016-04-25 DIAGNOSIS — I5042 Chronic combined systolic (congestive) and diastolic (congestive) heart failure: Secondary | ICD-10-CM

## 2016-04-25 DIAGNOSIS — I272 Pulmonary hypertension, unspecified: Secondary | ICD-10-CM

## 2016-04-25 DIAGNOSIS — I110 Hypertensive heart disease with heart failure: Secondary | ICD-10-CM

## 2016-04-25 NOTE — Progress Notes (Signed)
Cardiology Office Note   Date:  04/25/2016   ID:  ARYAHI DENZLER, DOB 13-Nov-1970, MRN [id number]  PCP:  Concepcion Living, NP  Cardiologist:   Chilton Si, MD   Chief Complaint  Patient presents with  . Follow-up  . Shortness of Breath    rarely.  . Edema    in feet     History of Present Illness: Jennifer Owens is a 46 y.o. female with chronic systolic ad diastolic heart failure (LVEF 45-50%), hypertensive heart disease, asthma, COPD, morbid obesity, OSA unable to afford BiPAP, and pulmonary hypertension who presents for follow up.  Ms. Windhorst was admitted to the hospital 6/21 through 7/5 2017 with hypoxic respiratory failure in the setting of acute systolic and diastolic heart failure and MSSA pneumonia. She required intubation and diuresis.  She was noted to have severe pulmonary hypertension.  She had an echo 08/24/15 that revealed LVEF 35-40% with a moderately reduced right ventricular function.  She then had a left heart cath on 08/29/15 that revealed no obstructive coronary disease and her ejection fraction was 40-50% with LVEDP 28 mmHg.  Her right heart catheterization revealed severe pulmonary hypertension with a PA pressure of 76/30.  She underwent a CT scan that was negative for pulmonary embolism.  After discharge she underwent a sleep study that revealed moderate obstructive sleep apnea and BiPAP was recommended.  At her last appointment 12/2015 Ms. Muldrew' blood pressure was poorly-controlled. Her weight was also up 6 pounds. She was reluctant to increase her Lasix as she is unable to get to the bathroom on time. Losartan and carvedilol were both increased. She followed up with our pharmacist on 02/2016 and her blood pressure was 132/84. Her basic metabolic panel was within normal limits at that time. She was instructed to take Lasix tablet as needed for lower extremity edema or weight gain. She notes that her breathing has been better. Last week she was able to walk around the  mall. However, she did get short of breath with this activity. She denies any chest pain. Her lower extremity edema has been better as well.  She is still awaiting insurance coverage so that she can get a BiPAP machine.  Past Medical History:  Diagnosis Date  . Asthma   . Chronic combined systolic and diastolic CHF (congestive heart failure) (HCC)    a. Echo 5/17: mild LVH, EF 55-60%, no RWMA, Gr 1 DD, mildly dilated Ao root, trivial eff //  b. Echo 6/17: mild conc LVH, EF 35-40%, RVE with mod RV dysfunction  . Hypertensive heart disease with CHF (congestive heart failure) (HCC)   . Moderate to severe pulmonary hypertension    a RHC 7/17 with PAP 76/30  . Morbid obesity (HCC)   . NICM (nonischemic cardiomyopathy) (HCC)    a. LHC 7/17 with normal cors  . Sciatica   . Thrombocytopenia (HCC)   . Tobacco abuse     Past Surgical History:  Procedure Laterality Date  . APPENDECTOMY    . CARDIAC CATHETERIZATION N/A 08/29/2015   Procedure: Right/Left Heart Cath and Coronary Angiography;  Surgeon: [name], MD;  Location[facility] INVASIVE CV LAB;  Service: Cardiovascular;  Laterality: N/A;  . CESAREAN SECTION    . CHOLECYSTECTOMY    . SMALL INTESTINE SURGERY       Current Outpatient Prescriptions  Medication Sig Dispense Refill  . acetaminophen (TYLENOL) 500 MG tablet Take 1,000 mg by mouth every 4 (four) hours as needed for moderate  pain or headache.    . albuterol (PROVENTIL HFA;VENTOLIN HFA) 108 (90 Base) MCG/ACT inhaler Inhale 2 puffs into the lungs every 6 (six) hours as needed for wheezing or shortness of breath. 1 Inhaler 0  . carvedilol (COREG) 12.5 MG tablet Take 1 tablet (12.5 mg total) by mouth 2 (two) times daily with a meal. 180 tablet 3  . furosemide (LASIX) 40 MG tablet 1 tablet by mouth daily and as needed 135 tablet 3  . losartan (COZAAR) 50 MG tablet Take 1 tablet (50 mg total) by mouth daily. 90 tablet 3  . potassium chloride SA (K-DUR,KLOR-CON) 20 MEQ tablet Take 1 tablet  (20 mEq total) by mouth 2 (two) times daily. 180 tablet 3   No current facility-administered medications for this visit.     Allergies:   Patient has no known allergies.    Social History:  The patient  reports that she quit smoking about 8 months ago. Her smoking use included Cigarettes. She has a 2.50 pack-year smoking history. She has never used smokeless tobacco. She reports that she does not drink alcohol or use drugs.   Family History:  The patient's family history includes Diabetes Mellitus II in her father and mother; Hypertension in her mother.    ROS:  Please see the history of present illness.   Otherwise, review of systems are positive for none.   All other systems are reviewed and negative.    PHYSICAL EXAM: VS:  BP 132/84   Pulse 75   Ht 5\' 3"  (1.6 m)   Wt (!) 141.3 kg (311 lb 6.4 oz)   BMI 55.16 kg/m  , BMI Body mass index is 55.16 kg/m. GENERAL:  Well appearing.  Morbidly obese. HEENT:  Pupils equal round and reactive, fundi not visualized, oral mucosa unremarkable NECK:  No jugular venous distention, waveform within normal limits, carotid upstroke brisk and symmetric, no bruits LYMPHATICS:  No cervical adenopathy LUNGS:  Clear to auscultation bilaterally HEART:  RRR.  PMI not displaced or sustained,S1 and S2 within normal limits, no S3, no S4, no clicks, no rubs, no murmurs ABD:  Flat, positive bowel sounds normal in frequency in pitch, no bruits, no rebound, no guarding, no midline pulsatile mass, no hepatomegaly, no splenomegaly.  +RUQ tenderness to palpation.  EXT:  2 plus pulses throughout, no edema, no cyanosis no clubbing SKIN:  No rashes no nodules NEURO:  Cranial nerves II through XII grossly intact, motor grossly intact throughout PSYCH:  Cognitively intact, oriented to person place and time  EKG:  EKG is ordered today. 04/25/16: Sinus rhythm. Rate 75 bpm. Nonspecific T wave abnormalities. QTc 460 ms.  LHC 08/29/15:  Normal coronary arteries LVF 40-50%.   LVEDP 28 mmHg  RHC 08/29/15:  RA 16, RV 64/15, PA 67/19, mean PA 43, PCWP 21  Echo 08/24/15: Study Conclusions  - Left ventricle: The cavity size was moderately dilated. There was   mild concentric hypertrophy. Systolic function was normal. Wall   motion was normal; there were no regional wall motion   abnormalities. The study is not technically sufficient to allow   evaluation of LV diastolic function. - Right ventricle: The cavity size was moderately dilated. Wall   thickness was normal. Systolic function was moderately reduced. - Pulmonic valve: Not visualized. - Pulmonary arteries: Systolic pressure couldn&'t be assessed. - Inferior vena cava: Not visualized. - Pericardium, extracardiac: There was no pericardial effusion.  Impressions:  - This is a very limited quality study.  Recent Labs: 08/17/2015:  B Natriuretic Peptide 589.6 08/26/2015: Magnesium 2.2 08/27/2015: TSH 0.659 02/13/2016: ALT 13; BUN 14; Creat 1.03; Hemoglobin 13.2; Platelets 30; Potassium 4.4; Sodium 139    Lipid Panel    Component Value Date/Time   CHOL 149 08/27/2015 0345   TRIG 166 (H) 08/27/2015 0345   HDL 20 (L) 08/27/2015 0345   CHOLHDL 7.5 08/27/2015 0345   VLDL 33 08/27/2015 0345   LDLCALC 96 08/27/2015 0345      Wt Readings from Last 3 Encounters:  04/25/16 (!) 141.3 kg (311 lb 6.4 oz)  02/13/16 (!) 144.7 kg (319 lb)  01/26/16 (!) 141.5 kg (312 lb)      ASSESSMENT AND PLAN:  # Severe pulmonary hypertension: # Chronic systolic and diastolic heart failure: # Hypertensive heart disease: Ms. Eustache appears to be euvolemic today.  Her weight is down 8 since her last appointment.  We discussed the importance of 2L fluid and 2g sodium limitations.Continue losartan and carvedilol.  She will get a BiPap machine as soon as she gets insurance.  # Hypertension: Blood pressure is better-controlled.  Continue losartan and carvedilol.  # OSA: She is unable to afford BiPAP until she gets insurance.   She is unsure of when that will happen.   # Morbid obesity:  We discussed the importance of increasing her physical activity and continuing to modify her diet to lose weight.  She was congratulated on starting to walk more.  Current medicines are reviewed at length with the patient today.  The patient does not have concerns regarding medicines.  The following changes have been made:  None Labs/ tests ordered today include:  No orders of the defined types were placed in this encounter.    Disposition:   FU with Erikka Follmer C. Duke Salvia, MD, [facility] in 4 months.    This note was written with the assistance of speech recognition software.  Please excuse any transcriptional errors.  Signed, Joelynn Dust C. Duke Salvia, MD, [facility]  04/25/2016 12:50 PM    [facility]

## 2016-04-25 NOTE — Patient Instructions (Signed)
Medication Instructions:  Your physician recommends that you continue on your current medications as directed. Please refer to the Current Medication list given to you today.  Labwork: NONE  Testing/Procedures: NONE  Follow-Up: Your physician recommends that you schedule a follow-up appointment in: 4 MONTH OFFICE VISIT   BLOOD PRESSURE ON RECHECK 132/84  If you need a refill on your cardiac medications before your next appointment, please call your pharmacy.

## 2016-05-07 MED FILL — ?LOSARTAN POTASSIUM 50 MG T: 50 MG | 30 days supply | Qty: 30 | Fill #2

## 2016-05-07 MED FILL — POTASSIUM CL ER 20 MEQ TAB: 20 | 30 days supply | Qty: 60 | Fill #2

## 2016-05-30 MED FILL — POTASSIUM CL ER 20 MEQ TAB: 20 | 30 days supply | Qty: 60 | Fill #3

## 2016-05-30 MED FILL — ?LOSARTAN POTASSIUM 50 MG T: 50 MG | 30 days supply | Qty: 30 | Fill #3

## 2016-05-30 MED FILL — FUROSEMIDE 40 MG TABLET: 40 | 30 days supply | Qty: 45 | Fill #2

## 2016-06-05 ENCOUNTER — Telehealth: Payer: Self-pay | Admitting: Cardiovascular Disease

## 2016-06-05 NOTE — Telephone Encounter (Signed)
Contacted patient-reports weight of 313lbs (311 lb at last OV). Just took her daily 40mg  Lasix.  Advised that ok to take extra (as rx is PRN as well) and monitor weights daily.  Reports appt with PCP on Thursday at 9:45.    Patient verbalized understanding.  Aware we would contact her back if Dr. Duke Owens has further recommendations.

## 2016-06-05 NOTE — Telephone Encounter (Signed)
I agree

## 2016-06-05 NOTE — Telephone Encounter (Signed)
Patient states that she has been coughing and spitting "green mucus" states that it has been going on for about three days. Patient has not contacted PCP. Thanks.

## 2016-06-05 NOTE — Telephone Encounter (Signed)
Returned call to patient-states she has had a productive cough with wheezing x 2-3 days.  Reports green, thick mucous.  Reports SOB with exertion.  Denies fever, chills, N/V.  Reports grandchildren are sick and "cough in my face".  Reports inhaler has not been helping.  Inquired about current weight, patient reports she has not weighed herself.  Reports she will weigh herself when she gets home and call to report.  Also reports swelling to BL LE, painful to walk on, abdomen "somewhat" distended.  Reports she did not take her meds today.    Per chart review-hx of HTN, CHF, pulm htn, NICM, asthma, PNA requiring intubation.  Lasix PRN daily, coreg 12.5mg  BID, losartan 50mg  q day.    Advised patient I would route to MD for recommendations, advised to contact PCP as well to rule out source of infection.  Patient verbalized understanding.  Patient  will call back -on her way home, will weigh and report back.

## 2016-06-07 ENCOUNTER — Encounter: Payer: Self-pay | Admitting: Family Medicine

## 2016-06-07 ENCOUNTER — Ambulatory Visit (INDEPENDENT_AMBULATORY_CARE_PROVIDER_SITE_OTHER): Payer: Self-pay | Admitting: Family Medicine

## 2016-06-07 VITALS — BP 140/82 | HR 92 | Temp 98.7°F | Resp 16 | Ht 63.0 in | Wt 314.0 lb

## 2016-06-07 DIAGNOSIS — R062 Wheezing: Secondary | ICD-10-CM

## 2016-06-07 DIAGNOSIS — J40 Bronchitis, not specified as acute or chronic: Secondary | ICD-10-CM

## 2016-06-07 DIAGNOSIS — J45909 Unspecified asthma, uncomplicated: Secondary | ICD-10-CM

## 2016-06-07 MED ORDER — AMOXICILLIN-POT CLAVULANATE 875-125 MG PO TABS: 1.0000 | ORAL_TABLET | Freq: Two times a day (BID) | ORAL | 0 refills | Status: DC

## 2016-06-07 MED ORDER — IPRATROPIUM BROMIDE 0.02 % IN SOLN
0.5000 mg | Freq: Once | RESPIRATORY_TRACT | Status: DC
  Administered 2016-06-07: 0.5 mg via RESPIRATORY_TRACT

## 2016-06-07 MED ORDER — PREDNISONE 20 MG PO TABS: 40.0000 mg | ORAL_TABLET | Freq: Every day | ORAL | 0 refills | Status: DC

## 2016-06-07 MED ORDER — ALBUTEROL SULFATE HFA 108 (90 BASE) MCG/ACT IN AERS: 2.0000 | INHALATION_SPRAY | Freq: Four times a day (QID) | RESPIRATORY_TRACT | 0 refills | Status: DC | PRN

## 2016-06-07 MED ORDER — ALBUTEROL SULFATE (2.5 MG/3ML) 0.083% IN NEBU
2.5000 mg | INHALATION_SOLUTION | Freq: Once | RESPIRATORY_TRACT | Status: DC
  Administered 2016-06-07: 2.5 mg via RESPIRATORY_TRACT

## 2016-06-07 MED ORDER — BENZONATATE 100 MG PO CAPS: 100.0000 mg | ORAL_CAPSULE | Freq: Three times a day (TID) | ORAL | 0 refills | Status: DC | PRN

## 2016-06-07 MED ORDER — IPRATROPIUM-ALBUTEROL 0.5-2.5 (3) MG/3ML IN SOLN: 3.0000 mL | Freq: Four times a day (QID) | RESPIRATORY_TRACT | Status: DC

## 2016-06-07 MED ORDER — IPRATROPIUM-ALBUTEROL 0.5-2.5 (3) MG/3ML IN SOLN: 3.0000 mL | Freq: Once | RESPIRATORY_TRACT | Status: DC

## 2016-06-07 MED FILL — AMOX-CLAV 875-125 MG TABLET: 875-125 | 10 days supply | Qty: 20 | Fill #0

## 2016-06-07 MED FILL — ?PREDNISONE 20 MG TABLET: 20 | 5 days supply | Qty: 10 | Fill #0

## 2016-06-07 MED FILL — !VENTOLIN HFA INHALER: 108 (90 BAS | 25 days supply | Qty: 18 | Fill #0

## 2016-06-07 MED FILL — BENZONATATE 100 MG CAPSULE: 100 | 10 days supply | Qty: 60 | Fill #0

## 2016-06-07 NOTE — Patient Instructions (Addendum)
For wheezing and shortness of breath  -Take prednisone 40 mg (2 pills) with breakfast for 5 days. -Albuterol inhaler, 2 puffs every 4-6 hours as needed for wheezing and shortness of breath.  Bronchitis -Start Augmentin 875/125 mg twice daily for 10 days with food.  Cough -Take benzonatate 1-2 tablets up to 3 times daily for cough as needed.   -The patient was given clear instructions to go to ER or return to medical center if symptoms do not improve, worsen or new problems develop. The patient verbalized understanding.   Bronchospasm, Adult Bronchospasm is a tightening of the airways going into the lungs. During an episode, it may be harder to breathe. You may cough, and you may make a whistling sound when you breathe (wheeze). This condition often affects people with asthma. What are the causes? This condition is caused by swelling and irritation in the airways. It can be triggered by:  An infection (common).  Seasonal allergies.  An allergic reaction.  Exercise.  Irritants. These include pollution, cigarette smoke, strong odors, aerosol sprays, and paint fumes.  Weather changes. Winds increase molds and pollens in the air. Cold air may cause swelling.  Stress and emotional upset. What are the signs or symptoms? Symptoms of this condition include:  Wheezing. If the episode was triggered by an allergy, wheezing may start right away or hours later.  Nighttime coughing.  Frequent or severe coughing with a simple cold.  Chest tightness.  Shortness of breath.  Decreased ability to exercise. How is this diagnosed? This condition is usually diagnosed with a review of your medical history and a physical exam. Tests, such as lung function tests, are sometimes done to look for other conditions. The need for a chest X-ray depends on where the wheezing occurs and whether it is the first time you have wheezed. How is this treated? This condition may be treated with:  Inhaled  medicines. These open up the airways and help you breathe. They can be taken with an inhaler or a nebulizer device.  Corticosteroid medicines. These may be given for severe bronchospasm, usually when it is associated with asthma.  Avoiding triggers, such as irritants, infection, or allergies. Follow these instructions at home: Medicines   Take over-the-counter and prescription medicines only as told by your health care provider.  If you need to use an inhaler or nebulizer to take your medicine, ask your health care provider to explain how to use it correctly. If you were given a spacer, always use it with your inhaler. Lifestyle   Reduce the number of triggers in your home. To do this:  Change your heating and air conditioning filter at least once a month.  Limit your use of fireplaces and wood stoves.  Do not smoke. Do not allow smoking in your home.  Avoid using perfumes and fragrances.  Get rid of pests, such as roaches and mice, and their droppings.  Remove any mold from your home.  Keep your house clean and dust free. Use unscented cleaning products.  Replace carpet with wood, tile, or vinyl flooring. Carpet can trap dander and dust.  Use allergy-proof pillows, mattress covers, and box spring covers.  Wash bed sheets and blankets every week in hot water. Dry them in a dryer.  Use blankets that are made of polyester or cotton.  Wash your hands often.  Do not allow pets in your bedroom.  Avoid breathing in cold air when you exercise. General instructions   Have a plan for seeking  medical care. Know when to call your health care provider and local emergency services, and where to get emergency care.  Stay up to date on your immunizations.  When you have an episode of bronchospasm, stay calm. Try to relax and breathe more slowly.  If you have asthma, make sure you have an asthma action plan.  Keep all follow-up visits as told by your health care provider. This is  important. Contact a health care provider if:  You have muscle aches.  You have chest pain.  The mucus that you cough up (sputum) changes from clear or white to yellow, green, gray, or bloody.  You have a fever.  Your sputum gets thicker. Get help right away if:  Your wheezing and coughing get worse, even after you take your prescribed medicines.  It gets even harder to breathe.  You develop severe chest pain. Summary  Bronchospasm is a tightening of the airways going into the lungs.  During an episode of bronchospasm, you may have a harder time breathing. You may cough and make a whistling sound when you breathe (wheeze).  Avoid exposure to triggers such as smoke, dust, mold, animal dander, and fragrances.  When you have an episode of bronchospasm, stay calm. Try to relax and breathe more slowly. This information is not intended to replace advice given to you by your health care provider. Make sure you discuss any questions you have with your health care provider. Document Released: 02/15/2003 Document Revised: 02/09/2016 Document Reviewed: 02/09/2016 Elsevier Interactive Patient Education  2017 [employer]         Acute Bronchitis, Adult Acute bronchitis is sudden (acute) swelling of the air tubes (bronchi) in the lungs. Acute bronchitis causes these tubes to fill with mucus, which can make it hard to breathe. It can also cause coughing or wheezing. In adults, acute bronchitis usually goes away within 2 weeks. A cough caused by bronchitis may last up to 3 weeks. Smoking, allergies, and asthma can make the condition worse. Repeated episodes of bronchitis may cause further lung problems, such as chronic obstructive pulmonary disease (COPD). What are the causes? This condition can be caused by germs and by substances that irritate the lungs, including:  Cold and flu viruses. This condition is most often caused by the same virus that causes a  cold.  Bacteria.  Exposure to tobacco smoke, dust, fumes, and air pollution. What increases the risk? This condition is more likely to develop in people who:  Have close contact with someone with acute bronchitis.  Are exposed to lung irritants, such as tobacco smoke, dust, fumes, and vapors.  Have a weak immune system.  Have a respiratory condition such as asthma. What are the signs or symptoms? Symptoms of this condition include:  A cough.  Coughing up clear, yellow, or green mucus.  Wheezing.  Chest congestion.  Shortness of breath.  A fever.  Body aches.  Chills.  A sore throat. How is this diagnosed? This condition is usually diagnosed with a physical exam. During the exam, your health care provider may order tests, such as chest X-rays, to rule out other conditions. He or she may also:  Test a sample of your mucus for bacterial infection.  Check the level of oxygen in your blood. This is done to check for pneumonia.  Do a chest X-ray or lung function testing to rule out pneumonia and other conditions.  Perform blood tests. Your health care provider will also ask about your symptoms and medical  history. How is this treated? Most cases of acute bronchitis clear up over time without treatment. Your health care provider may recommend:  Drinking more fluids. Drinking more makes your mucus thinner, which may make it easier to breathe.  Taking a medicine for a fever or cough.  Taking an antibiotic medicine.  Using an inhaler to help improve shortness of breath and to control a cough.  Using a cool mist vaporizer or humidifier to make it easier to breathe. Follow these instructions at home: Medicines   Take over-the-counter and prescription medicines only as told by your health care provider.  If you were prescribed an antibiotic, take it as told by your health care provider. Do not stop taking the antibiotic even if you start to feel better. General  instructions   Get plenty of rest.  Drink enough fluids to keep your urine clear or pale yellow.  Avoid smoking and secondhand smoke. Exposure to cigarette smoke or irritating chemicals will make bronchitis worse. If you smoke and you need help quitting, ask your health care provider. Quitting smoking will help your lungs heal faster.  Use an inhaler, cool mist vaporizer, or humidifier as told by your health care provider.  Keep all follow-up visits as told by your health care provider. This is important. How is this prevented? To lower your risk of getting this condition again:  Wash your hands often with soap and water. If soap and water are not available, use hand sanitizer.  Avoid contact with people who have cold symptoms.  Try not to touch your hands to your mouth, nose, or eyes.  Make sure to get the flu shot every year. Contact a health care provider if:  Your symptoms do not improve in 2 weeks of treatment. Get help right away if:  You cough up blood.  You have chest pain.  You have severe shortness of breath.  You become dehydrated.  You faint or keep feeling like you are going to faint.  You keep vomiting.  You have a severe headache.  Your fever or chills gets worse. This information is not intended to replace advice given to you by your health care provider. Make sure you discuss any questions you have with your health care provider. Document Released: 03/22/2004 Document Revised: 09/07/2015 Document Reviewed: 08/03/2015 Elsevier Interactive Patient Education  2017 [employer].

## 2016-06-07 NOTE — Progress Notes (Signed)
Patient ID: Jennifer Owens, female    DOB: [birth year], 46 y.o.   MRN: [id number]  PCP: Joaquin Courts, FNP  Chief Complaint  Patient presents with  . Nasal Congestion  . Cough  . Shortness of Breath    Subjective:  HPI  Jennifer Owens is a 46 y.o. female presents for evaluation of cough, congestion, with shortness of breath. Medical problems include pulmonary hypertension, CHD, asthma, and cardiomyopathy. Reports symptoms of shortness of breath, wheezing, productive cough (greenish sputum), and nasal congestion x  6 days. She has used her albuterol inhaler as prescribed with achieving only minimal relief of shortness of breath. She has remained afebrile although reports generalized body aches and fatigue as she is not sleeping related to  persistent coughing. She reports intermittent chest tightness with wheezing. Today she awakened with headache and some nasal drainage. Feels condition is worsening and decided to come in to be evaluated.   Social History   Social History  . Marital status: Legally Separated    Spouse name: N/A  . Number of children: N/A  . Years of education: N/A   Occupational History  . 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 Smoking status: Former Smoker    Packs/day: 0.25    Years: 10.00    Types: Cigarettes    Quit date: 07/2015  . Smokeless tobacco: Never Used  . Alcohol use No     Comment: very rarely - 1-2 drinks/month  . Drug use: No  . Sexual activity: Not on file   Other Topics Concern  . Not on file   Social History Narrative  . No narrative on file    Family History  Problem Relation Age of Onset  . Diabetes Mellitus II Mother   . Hypertension Mother   . Diabetes Mellitus II Father    Review of Systems See HPI  Patient Active Problem List   Diagnosis Date Noted  . NICM (nonischemic cardiomyopathy) (HCC) 09/06/2015  . Pulmonary hypertension   . Chronic combined systolic and diastolic heart  failure (HCC)   . Morbid obesity (HCC)   . Hypertensive heart disease 08/17/2015  . Hypoxia 07/22/2015  . Asthma 07/22/2015  . Thrombocytopenia (HCC) 07/22/2015  . Tobacco abuse 07/22/2015    No Known Allergies  Prior to Admission medications   Medication Sig Start Date End Date Taking? Authorizing Provider  acetaminophen (TYLENOL) 500 MG tablet Take 1,000 mg by mouth every 4 (four) hours as needed for moderate pain or headache.   Yes Historical Provider, MD  albuterol (PROVENTIL HFA;VENTOLIN HFA) 108 (90 Base) MCG/ACT inhaler Inhale 2 puffs into the lungs every 6 (six) hours as needed for wheezing or shortness of breath. 08/31/15  Yes Shanker Levora Dredge, MD  carvedilol (COREG) 12.5 MG tablet Take 1 tablet (12.5 mg total) by mouth 2 (two) times daily with a meal. 03/01/16  Yes Chilton Si, MD  furosemide (LASIX) 40 MG tablet 1 tablet by mouth daily and as needed 03/01/16  Yes Chilton Si, MD  losartan (COZAAR) 50 MG tablet Take 1 tablet (50 mg total) by mouth daily. 03/01/16  Yes Chilton Si, MD  potassium chloride SA (K-DUR,KLOR-CON) 20 MEQ tablet Take 1 tablet (20 mEq total) by mouth 2 (two) times daily. 03/01/16  Yes Chilton Si, MD    Past Medical, Surgical Family and Social History reviewed and updated.    Objective:   Today's Vitals   06/07/16 1009  BP: 140/82  Pulse: 92  Resp: 16  Temp: 98.7 F (37.1 C)  TempSrc: Oral  SpO2: 96%  Weight: (!) 314 lb (142.4 kg)  Height: 5\' 3"  (1.6 m)    Wt Readings from Last 3 Encounters:  06/07/16 (!) 314 lb (142.4 kg)  04/25/16 (!) 311 lb 6.4 oz (141.3 kg)  02/13/16 (!) 319 lb (144.7 kg)    Physical Exam  Constitutional: She is oriented to person, place, and time. She appears well-developed and well-nourished.  HENT:  Head: Normocephalic and atraumatic.  Right Ear: External ear normal.  Left Ear: External ear normal.  Nose: Mucosal edema present.  Mouth/Throat: Oropharynx is clear and moist.  Cardiovascular: Normal  rate, normal heart sounds and intact distal pulses.   Pulmonary/Chest: She has decreased breath sounds in the right upper field, the right middle field, the right lower field, the left upper field, the left middle field and the left lower field. She has wheezes in the right lower field and the left upper field. She has rhonchi in the right lower field and the left upper field. She exhibits tenderness.  Neurological: She is alert and oriented to person, place, and time.  Skin: Skin is warm and dry.  Psychiatric: She has a normal mood and affect. Her behavior is normal. Thought content normal.      Assessment & Plan:  1. Wheezing - ipratropium-albuterol (DUONEB) 0.5-2.5 (3) MG/3ML nebulizer solution 3 mL; Take 3 mLs by nebulization every 6 (six) hours. Take prednisone 40 mg (2 pills) with breakfast for 5 days. -Albuterol inhaler, 2 puffs every 4-6 hours as needed for wheezing and shortness of breath.  2. Asthma, unspecified asthma severity, unspecified whether complicated, unspecified whether persistent - ipratropium-albuterol (DUONEB) 0.5-2.5 (3) MG/3ML nebulizer solution 3 mL; Take 3 mLs by nebulization once.  3. Bronchitis  -Start Augmentin 875/125 mg twice daily for 10 days with food. -Take benzonatate, 100-200 mg, 1-2 tablets up to 3 times daily for cough as needed.  RTC: 2 weeks to evaluate effectiveness of treatment.  The patient was given clear instructions to go to ER or return to medical center if symptoms do not improve, worsen or new problems develop.  The patient verbalized understanding.   [name]. Tiburcio Pea, MSN, [facility] [address] [place], [place] [address] [phone]

## 2016-06-13 NOTE — Telephone Encounter (Signed)
Tried calling patient, unable to reach as phone not taking calls at this time

## 2016-06-21 ENCOUNTER — Ambulatory Visit (INDEPENDENT_AMBULATORY_CARE_PROVIDER_SITE_OTHER): Payer: Self-pay | Admitting: Family Medicine

## 2016-06-21 ENCOUNTER — Encounter: Payer: Self-pay | Admitting: Family Medicine

## 2016-06-21 VITALS — BP 134/84 | HR 81 | Temp 98.1°F | Resp 16 | Ht 63.0 in | Wt 311.0 lb

## 2016-06-21 DIAGNOSIS — IMO0001 Reserved for inherently not codable concepts without codable children: Secondary | ICD-10-CM

## 2016-06-21 DIAGNOSIS — Z6841 Body Mass Index (BMI) 40.0 and over, adult: Secondary | ICD-10-CM

## 2016-06-21 DIAGNOSIS — E669 Obesity, unspecified: Secondary | ICD-10-CM

## 2016-06-21 DIAGNOSIS — I509 Heart failure, unspecified: Secondary | ICD-10-CM

## 2016-06-21 DIAGNOSIS — Z131 Encounter for screening for diabetes mellitus: Secondary | ICD-10-CM

## 2016-06-21 DIAGNOSIS — J4540 Moderate persistent asthma, uncomplicated: Secondary | ICD-10-CM

## 2016-06-21 LAB — CBC WITH DIFFERENTIAL/PLATELET
BASOS PCT: 0 %
Basophils Absolute: 0 cells/uL (ref 0–200)
EOS ABS: 216 {cells}/uL (ref 15–500)
Eosinophils Relative: 3 %
HCT: 40.5 % (ref 35.0–45.0)
Hemoglobin: 13.5 g/dL (ref 11.7–15.5)
LYMPHS PCT: 30 %
Lymphs Abs: 2160 cells/uL (ref 850–3900)
MCH: 31.9 pg (ref 27.0–33.0)
MCHC: 33.3 g/dL (ref 32.0–36.0)
MCV: 95.7 fL (ref 80.0–100.0)
MONO ABS: 720 {cells}/uL (ref 200–950)
MONOS PCT: 10 %
MPV: 10.4 fL (ref 7.5–12.5)
NEUTROS ABS: 4104 {cells}/uL (ref 1500–7800)
Neutrophils Relative %: 57 %
PLATELETS: 75 10*3/uL — AB (ref 140–400)
RBC: 4.23 MIL/uL (ref 3.80–5.10)
RDW: 14.7 % (ref 11.0–15.0)
WBC: 7.2 10*3/uL (ref 3.8–10.8)

## 2016-06-21 MED ORDER — GABAPENTIN 300 MG PO CAPS: 300.0000 mg | ORAL_CAPSULE | Freq: Three times a day (TID) | ORAL | 3 refills | Status: DC

## 2016-06-21 MED ORDER — BUDESONIDE-FORMOTEROL FUMARATE 80-4.5 MCG/ACT IN AERO: 2.0000 | INHALATION_SPRAY | Freq: Two times a day (BID) | RESPIRATORY_TRACT | 3 refills | Status: DC

## 2016-06-21 MED ORDER — CETIRIZINE HCL 10 MG PO TABS: 10.0000 mg | ORAL_TABLET | Freq: Every day | ORAL | 11 refills | Status: AC

## 2016-06-21 MED FILL — GABAPENTIN 300 MG CAPSULE: 300 | 30 days supply | Qty: 90 | Fill #0

## 2016-06-21 MED FILL — SYMBICORT 80-4.5 MCG INH: 80-4.5 | 30 days supply | Qty: 10 | Fill #0

## 2016-06-21 MED FILL — ?CETIRIZINE HCL 10 MG TABLE: 10 | 30 days supply | Qty: 30 | Fill #0

## 2016-06-21 NOTE — Progress Notes (Signed)
Patient ID: Jennifer Owens, female    DOB: 03-05-1970, 46 y.o.   MRN: [id number]  PCP: Jennifer Courts, FNP  Chief Complaint  Patient presents with  . Follow-up    Subjective:  HPI Jennifer Owens is a 46 y.o. female presents for asthma follow-up.  [name] was seen on 06/07/2016 for an acute asthma exacerbation.   She was diagnosed with acute bronchitis, treated with a prednisone taper and oral antibiotics.  Today she reports overall improvement. Continues to experience intermittent shortness of breath and wheezing. Last used albuterol 2 night ago for an acute episodes of shortness of breath. Continue to have a non-productive cough.  Denies chest tightness or fever.  Jennifer Owens would also like recommendations for weigh loss today. She currently lives a very sedentary lifestyle. Currently unemployed. She is under the care of cardiology for pulmonary hypertension and chronic diastolic heart failure. She admits to continue sodium use, consuming fat, and sugary drinks/foods. Would like to know if she could purchase diet pills from TV to aid in weight loss. Feels exercise is difficult due to shortness of breath, chronic foot neuropathic pain,  and excessive body fat. Foot pain has been present since last year and she describes pain as sharp and as if needles are sticking in toes and feet. Foot pain initially  occurred after she had significant pitting peripheral edema. Although edema resolves, her foot pain never subsided. She take over the counter ibuprofen and tylenol without improvement of symptoms.  Social History   Social History  . Marital status: Legally Separated    Spouse name: N/A  . Number of children: N/A  . Years of education: N/A   Occupational History  . 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 Smoking status: Former Smoker    Packs/day: 0.25    Years: 10.00    Types: Cigarettes    Quit date: 07/2015  . Smokeless tobacco: Never Used  . Alcohol  use No     Comment: very rarely - 1-2 drinks/month  . Drug use: No  . Sexual activity: Not on file   Other Topics Concern  . Not on file   Social History Narrative  . No narrative on file    Family History  Problem Relation Age of Onset  . Diabetes Mellitus II Mother   . Hypertension Mother   . Diabetes Mellitus II Father    Review of Systems HPI  Patient Active Problem List   Diagnosis Date Noted  . NICM (nonischemic cardiomyopathy) (HCC) 09/06/2015  . Pulmonary hypertension (HCC)   . Chronic combined systolic and diastolic heart failure (HCC)   . Morbid obesity (HCC)   . Hypertensive heart disease 08/17/2015  . Hypoxia 07/22/2015  . Asthma 07/22/2015  . Thrombocytopenia (HCC) 07/22/2015  . Tobacco abuse 07/22/2015    No Known Allergies  Prior to Admission medications   Medication Sig Start Date End Date Taking? Authorizing Provider  acetaminophen (TYLENOL) 500 MG tablet Take 1,000 mg by mouth every 4 (four) hours as needed for moderate pain or headache.   Yes Historical Provider, MD  albuterol (PROVENTIL HFA;VENTOLIN HFA) 108 (90 Base) MCG/ACT inhaler Inhale 2 puffs into the lungs every 6 (six) hours as needed for wheezing or shortness of breath. 06/07/16  Yes Jennifer Askew, FNP  benzonatate (TESSALON) 100 MG capsule Take 1-2 capsules (100-200 mg total) by mouth 3 (three) times daily as needed for cough. 06/07/16  Yes Jennifer Owens Kitchen  Jennifer Creedon, FNP  carvedilol (COREG) 12.5 MG tablet Take 1 tablet (12.5 mg total) by mouth 2 (two) times daily with a meal. 03/01/16  Yes Jennifer Si, MD  furosemide (LASIX) 40 MG tablet 1 tablet by mouth daily and as needed 03/01/16  Yes Jennifer Si, MD  losartan (COZAAR) 50 MG tablet Take 1 tablet (50 mg total) by mouth daily. 03/01/16  Yes Jennifer Si, MD  potassium chloride SA (K-DUR,KLOR-CON) 20 MEQ tablet Take 1 tablet (20 mEq total) by mouth 2 (two) times daily. 03/01/16  Yes Jennifer Si, MD    Past Medical,  Surgical Family and Social History reviewed and updated.    Objective:   Today's Vitals   06/21/16 0946  BP: 134/84  Pulse: 81  Resp: 16  Temp: 98.1 F (36.7 C)  TempSrc: Oral  SpO2: 96%  Weight: (!) 311 lb (141.1 kg)  Height: 5\' 3"  (1.6 m)    Wt Readings from Last 3 Encounters:  06/21/16 (!) 311 lb (141.1 kg)  06/07/16 (!) 314 lb (142.4 kg)  04/25/16 (!) 311 lb 6.4 oz (141.3 kg)    Physical Exam  Constitutional: She is oriented to person, place, and time. She appears well-developed and well-nourished.  HENT:  Head: Normocephalic and atraumatic.  Eyes: Conjunctivae and EOM are normal. Pupils are equal, round, and reactive to light.  Neck: Normal range of motion.  Cardiovascular: Normal rate, regular rhythm, normal heart sounds and intact distal pulses.   Pulmonary/Chest: She has decreased breath sounds in the right upper field, the right middle field, the right lower field, the left upper field, the left middle field and the left lower field. She has no wheezes. She exhibits no tenderness.  Abdominal:  Morbidly Obese   Musculoskeletal: Normal range of motion.  Neurological: She is alert and oriented to person, place, and time.  Skin: Skin is warm and dry.  Psychiatric: She has a normal mood and affect. Her behavior is normal. Judgment and thought content normal.    Assessment & Plan:  1. Moderate persistent asthma without complication -Continue albuterol 2 puffs every 4-6 hours as needed. -Start budsonide-formoterol 2 puffs, 2 times per day for prevention  2. Class 3 obesity with serious comorbidity and body mass index (BMI) of 40.0 to 44.9 in adult, unspecified obesity type (HCC) Goal: Loss 5 lbs by next office visit. Implement 15 minutes of physical activity for a minimum of 3 days. You can perform abbreviated exercises in your chair-leg lifts, sits-ups, walking , water aerobics. Do Not purchase any weight loss pills or supplement as these can have irreversible effects  on your heart.  Discontinue today sodas, snack foods such as chips.    3. Chronic heart failure, unspecified heart failure type (HCC) -Continue cardiology follow-up and management  -Continue daily weights   4. Screening for diabetes mellitus - Hemoglobin A1C  5. Foot pain -Gabapentin 300 mg, three times daily   [name]. Tiburcio Pea, MSN, [facility] [address] [place], [place] [address] [phone]

## 2016-06-21 NOTE — Patient Instructions (Addendum)
Goal for weight loss 5 lbs at June visit. Increase physical activity to a minimal of 15 minutes per day for a minimum 3 days per week. Discontinue sodas and snack foods such as chips. Increase protein nuts and thinly sliced deli meats, vegetables, and fruits.  Start Gabapentin 300 mg three times daily for foot pain.   For asthma symptoms  Administer Symbicort inhaler  2 puffs twice daily regardless of symptoms. This is a maintenance inhaler use will prevent frequency fo asthma exacerbations.  Continue albuterol inhaler as needed.  See the attached information regarding weight loss tips.   DASH Eating Plan DASH stands for "Dietary Approaches to Stop Hypertension." The DASH eating plan is a healthy eating plan that has been shown to reduce high blood pressure (hypertension). It may also reduce your risk for type 2 diabetes, heart disease, and stroke. The DASH eating plan may also help with weight loss. What are tips for following this plan? General guidelines   Avoid eating more than 2,300 mg (milligrams) of salt (sodium) a day. If you have hypertension, you may need to reduce your sodium intake to 1,500 mg a day.  Limit alcohol intake to no more than 1 drink a day for nonpregnant women and 2 drinks a day for men. One drink equals 12 oz of beer, 5 oz of wine, or 1 oz of hard liquor.  Work with your health care provider to maintain a healthy body weight or to lose weight. Ask what an ideal weight is for you.  Get at least 30 minutes of exercise that causes your heart to beat faster (aerobic exercise) most days of the week. Activities may include walking, swimming, or biking.  Work with your health care provider or diet and nutrition specialist (dietitian) to adjust your eating plan to your individual calorie needs. Reading food labels   Check food labels for the amount of sodium per serving. Choose foods with less than 5 percent of the Daily Value of sodium. Generally, foods with less  than 300 mg of sodium per serving fit into this eating plan.  To find whole grains, look for the word "whole" as the first word in the ingredient list. Shopping   Buy products labeled as "low-sodium" or "no salt added."  Buy fresh foods. Avoid canned foods and premade or frozen meals. Cooking   Avoid adding salt when cooking. Use salt-free seasonings or herbs instead of table salt or sea salt. Check with your health care provider or pharmacist before using salt substitutes.  Do not fry foods. Cook foods using healthy methods such as baking, boiling, grilling, and broiling instead.  Cook with heart-healthy oils, such as olive, canola, soybean, or sunflower oil. Meal planning    Eat a balanced diet that includes:  5 or more servings of fruits and vegetables each day. At each meal, try to fill half of your plate with fruits and vegetables.  Up to 6-8 servings of whole grains each day.  Less than 6 oz of lean meat, poultry, or fish each day. A 3-oz serving of meat is about the same size as a deck of cards. One egg equals 1 oz.  2 servings of low-fat dairy each day.  A serving of nuts, seeds, or beans 5 times each week.  Heart-healthy fats. Healthy fats called Omega-3 fatty acids are found in foods such as flaxseeds and coldwater fish, like sardines, salmon, and mackerel.  Limit how much you eat of the following:  Canned or prepackaged foods.  Food that is high in trans fat, such as fried foods.  Food that is high in saturated fat, such as fatty meat.  Sweets, desserts, sugary drinks, and other foods with added sugar.  Full-fat dairy products.  Do not salt foods before eating.  Try to eat at least 2 vegetarian meals each week.  Eat more home-cooked food and less restaurant, buffet, and fast food.  When eating at a restaurant, ask that your food be prepared with less salt or no salt, if possible. What foods are recommended? The items listed may not be a complete list.  Talk with your dietitian about what dietary choices are best for you. Grains  Whole-grain or whole-wheat bread. Whole-grain or whole-wheat pasta. Brown rice. Orpah Cobb. Bulgur. Whole-grain and low-sodium cereals. Pita bread. Low-fat, low-sodium crackers. Whole-wheat flour tortillas. Vegetables  Fresh or frozen vegetables (raw, steamed, roasted, or grilled). Low-sodium or reduced-sodium tomato and vegetable juice. Low-sodium or reduced-sodium tomato sauce and tomato paste. Low-sodium or reduced-sodium canned vegetables. Fruits  All fresh, dried, or frozen fruit. Canned fruit in natural juice (without added sugar). Meat and other protein foods  Skinless chicken or [place]. Ground chicken or [place]. Pork with fat trimmed off. Fish and seafood. Egg whites. Dried beans, peas, or lentils. Unsalted nuts, nut butters, and seeds. Unsalted canned beans. Lean cuts of beef with fat trimmed off. Low-sodium, lean deli meat. Dairy  Low-fat (1%) or fat-free (skim) milk. Fat-free, low-fat, or reduced-fat cheeses. Nonfat, low-sodium ricotta or cottage cheese. Low-fat or nonfat yogurt. Low-fat, low-sodium cheese. Fats and oils  Soft margarine without trans fats. Vegetable oil. Low-fat, reduced-fat, or light mayonnaise and salad dressings (reduced-sodium). Canola, safflower, olive, soybean, and sunflower oils. Avocado. Seasoning and other foods  Herbs. Spices. Seasoning mixes without salt. Unsalted popcorn and pretzels. Fat-free sweets. What foods are not recommended? The items listed may not be a complete list. Talk with your dietitian about what dietary choices are best for you. Grains  Baked goods made with fat, such as croissants, muffins, or some breads. Dry pasta or rice meal packs. Vegetables  Creamed or fried vegetables. Vegetables in a cheese sauce. Regular canned vegetables (not low-sodium or reduced-sodium). Regular canned tomato sauce and paste (not low-sodium or reduced-sodium). Regular tomato and  vegetable juice (not low-sodium or reduced-sodium). Rosita Fire. Olives. Fruits  Canned fruit in a light or heavy syrup. Fried fruit. Fruit in cream or butter sauce. Meat and other protein foods  Fatty cuts of meat. Ribs. Fried meat. Tomasa Blase. Sausage. Bologna and other processed lunch meats. Salami. Fatback. Hotdogs. Bratwurst. Salted nuts and seeds. Canned beans with added salt. Canned or smoked fish. Whole eggs or egg yolks. Chicken or [place] with skin. Dairy  Whole or 2% milk, cream, and half-and-half. Whole or full-fat cream cheese. Whole-fat or sweetened yogurt. Full-fat cheese. Nondairy creamers. Whipped toppings. Processed cheese and cheese spreads. Fats and oils  Butter. Stick margarine. Lard. Shortening. Ghee. Bacon fat. Tropical oils, such as coconut, palm kernel, or palm oil. Seasoning and other foods  Salted popcorn and pretzels. Onion salt, garlic salt, seasoned salt, table salt, and sea salt. Worcestershire sauce. Tartar sauce. Barbecue sauce. Teriyaki sauce. Soy sauce, including reduced-sodium. Steak sauce. Canned and packaged gravies. Fish sauce. Oyster sauce. Cocktail sauce. Horseradish that you find on the shelf. Ketchup. Mustard. Meat flavorings and tenderizers. Bouillon cubes. Hot sauce and Tabasco sauce. Premade or packaged marinades. Premade or packaged taco seasonings. Relishes. Regular salad dressings. Where to find more information:  National Heart, Lung, and Blood  Institute: PopSteam.is  American Heart Association: www.heart.org Summary  The DASH eating plan is a healthy eating plan that has been shown to reduce high blood pressure (hypertension). It may also reduce your risk for type 2 diabetes, heart disease, and stroke.  With the DASH eating plan, you should limit salt (sodium) intake to 2,300 mg a day. If you have hypertension, you may need to reduce your sodium intake to 1,500 mg a day.  When on the DASH eating plan, aim to eat more fresh fruits and vegetables,  whole grains, lean proteins, low-fat dairy, and heart-healthy fats.  Work with your health care provider or diet and nutrition specialist (dietitian) to adjust your eating plan to your individual calorie needs. This information is not intended to replace advice given to you by your health care provider. Make sure you discuss any questions you have with your health care provider. Document Released: 02/01/2011 Document Revised: 02/06/2016 Document Reviewed: 02/06/2016 Elsevier Interactive Patient Education  2017 [employer].  Exercising to [employer] Exercising can help you to lose weight. In order to lose weight through exercise, you need to do vigorous-intensity exercise. You can tell that you are exercising with vigorous intensity if you are breathing very hard and fast and cannot hold a conversation while exercising. Moderate-intensity exercise helps to maintain your current weight. You can tell that you are exercising at a moderate level if you have a higher heart rate and faster breathing, but you are still able to hold a conversation. How often should I exercise? Choose an activity that you enjoy and set realistic goals. Your health care provider can help you to make an activity plan that works for you. Exercise regularly as directed by your health care provider. This may include:  Doing resistance training twice each week, such as:  Push-ups.  Sit-ups.  Lifting weights.  Using resistance bands.  Doing a given intensity of exercise for a given amount of time. Choose from these options:  150 minutes of moderate-intensity exercise every week.  75 minutes of vigorous-intensity exercise every week.  A mix of moderate-intensity and vigorous-intensity exercise every week. Children, pregnant women, people who are out of shape, people who are overweight, and older adults may need to consult a health care provider for individual recommendations. If you have any sort of medical condition,  be sure to consult your health care provider before starting a new exercise program. What are some activities that can help me to lose weight?  Walking at a rate of at least 4.5 miles an hour.  Jogging or running at a rate of 5 miles per hour.  Biking at a rate of at least 10 miles per hour.  Lap swimming.  Roller-skating or in-line skating.  Cross-country skiing.  Vigorous competitive sports, such as football, basketball, and soccer.  Jumping rope.  Aerobic dancing. How can I be more active in my day-to-day activities?  Use the stairs instead of the elevator.  Take a walk during your lunch break.  If you drive, park your car farther away from work or school.  If you take public transportation, get off one stop early and walk the rest of the way.  Make all of your phone calls while standing up and walking around.  Get up, stretch, and walk around every 30 minutes throughout the day. What guidelines should I follow while exercising?  Do not exercise so much that you hurt yourself, feel dizzy, or get very short of breath.  Consult your health  care provider prior to starting a new exercise program.  Wear comfortable clothes and shoes with good support.  Drink plenty of water while you exercise to prevent dehydration or heat stroke. Body water is lost during exercise and must be replaced.  Work out until you breathe faster and your heart beats faster. This information is not intended to replace advice given to you by your health care provider. Make sure you discuss any questions you have with your health care provider. Document Released: 03/17/2010 Document Revised: 07/21/2015 Document Reviewed: 07/16/2013 Elsevier Interactive Patient Education  2017 [employer] Inc.  Calorie Counting for [employer] Loss Calories are units of energy. Your body needs a certain amount of calories from food to keep you going throughout the day. When you eat more calories than your body needs, your  body stores the extra calories as fat. When you eat fewer calories than your body needs, your body burns fat to get the energy it needs. Calorie counting means keeping track of how many calories you eat and drink each day. Calorie counting can be helpful if you need to lose weight. If you make sure to eat fewer calories than your body needs, you should lose weight. Ask your health care provider what a healthy weight is for you. For calorie counting to work, you will need to eat the right number of calories in a day in order to lose a healthy amount of weight per week. A dietitian can help you determine how many calories you need in a day and will give you suggestions on how to reach your calorie goal.  A healthy amount of weight to lose per week is usually 1-2 lb (0.5-0.9 kg). This usually means that your daily calorie intake should be reduced by 500-750 calories.  Eating 1,200 - 1,500 calories per day can help most women lose weight.  Eating 1,500 - 1,800 calories per day can help most men lose weight. What is my plan? My goal is to have __________ calories per day. If I have this many calories per day, I should lose around __________ pounds per week. What do I need to know about calorie counting? In order to meet your daily calorie goal, you will need to:  Find out how many calories are in each food you would like to eat. Try to do this before you eat.  Decide how much of the food you plan to eat.  Write down what you ate and how many calories it had. Doing this is called keeping a food log. To successfully lose weight, it is important to balance calorie counting with a healthy lifestyle that includes regular activity. Aim for 150 minutes of moderate exercise (such as walking) or 75 minutes of vigorous exercise (such as running) each week. Where do I find calorie information?   The number of calories in a food can be found on a Nutrition Facts label. If a food does not have a Nutrition Facts  label, try to look up the calories online or ask your dietitian for help. Remember that calories are listed per serving. If you choose to have more than one serving of a food, you will have to multiply the calories per serving by the amount of servings you plan to eat. For example, the label on a package of bread might say that a serving size is 1 slice and that there are 90 calories in a serving. If you eat 1 slice, you will have eaten 90 calories. If you eat  2 slices, you will have eaten 180 calories. How do I keep a food log? Immediately after each meal, record the following information in your food log:  What you ate. Don't forget to include toppings, sauces, and other extras on the food.  How much you ate. This can be measured in cups, ounces, or number of items.  How many calories each food and drink had.  The total number of calories in the meal. Keep your food log near you, such as in a small notebook in your pocket, or use a mobile app or website. Some programs will calculate calories for you and show you how many calories you have left for the day to meet your goal. What are some calorie counting tips?  Use your calories on foods and drinks that will fill you up and not leave you hungry:  Some examples of foods that fill you up are nuts and nut butters, vegetables, lean proteins, and high-fiber foods like whole grains. High-fiber foods are foods with more than 5 g fiber per serving.  Drinks such as sodas, specialty coffee drinks, alcohol, and juices have a lot of calories, yet do not fill you up.  Eat nutritious foods and avoid empty calories. Empty calories are calories you get from foods or beverages that do not have many vitamins or protein, such as candy, sweets, and soda. It is better to have a nutritious high-calorie food (such as an avocado) than a food with few nutrients (such as a bag of chips).  Know how many calories are in the foods you eat most often. This will help you  calculate calorie counts faster.  Pay attention to calories in drinks. Low-calorie drinks include water and unsweetened drinks.  Pay attention to nutrition labels for "low fat" or "fat free" foods. These foods sometimes have the same amount of calories or more calories than the full fat versions. They also often have added sugar, starch, or salt, to make up for flavor that was removed with the fat.  Find a way of tracking calories that works for you. Get creative. Try different apps or programs if writing down calories does not work for you. What are some portion control tips?  Know how many calories are in a serving. This will help you know how many servings of a certain food you can have.  Use a measuring cup to measure serving sizes. You could also try weighing out portions on a kitchen scale. With time, you will be able to estimate serving sizes for some foods.  Take some time to put servings of different foods on your favorite plates, bowls, and cups so you know what a serving looks like.  Try not to eat straight from a bag or box. Doing this can lead to overeating. Put the amount you would like to eat in a cup or on a plate to make sure you are eating the right portion.  Use smaller plates, glasses, and bowls to prevent overeating.  Try not to multitask (for example, watch TV or use your computer) while eating. If it is time to eat, sit down at a table and enjoy your food. This will help you to know when you are full. It will also help you to be aware of what you are eating and how much you are eating. What are tips for following this plan? Reading food labels   Check the calorie count compared to the serving size. The serving size may be smaller than what  you are used to eating.  Check the source of the calories. Make sure the food you are eating is high in vitamins and protein and low in saturated and trans fats. Shopping   Read nutrition labels while you shop. This will help you  make healthy decisions before you decide to purchase your food.  Make a grocery list and stick to it. Cooking   Try to cook your favorite foods in a healthier way. For example, try baking instead of frying.  Use low-fat dairy products. Meal planning   Use more fruits and vegetables. Half of your plate should be fruits and vegetables.  Include lean proteins like poultry and fish. How do I count calories when eating out?  Ask for smaller portion sizes.  Consider sharing an entree and sides instead of getting your own entree.  If you get your own entree, eat only half. Ask for a box at the beginning of your meal and put the rest of your entree in it so you are not tempted to eat it.  If calories are listed on the menu, choose the lower calorie options.  Choose dishes that include vegetables, fruits, whole grains, low-fat dairy products, and lean protein.  Choose items that are boiled, broiled, grilled, or steamed. Stay away from items that are buttered, battered, fried, or served with cream sauce. Items labeled "crispy" are usually fried, unless stated otherwise.  Choose water, low-fat milk, unsweetened iced tea, or other drinks without added sugar. If you want an alcoholic beverage, choose a lower calorie option such as a glass of wine or light beer.  Ask for dressings, sauces, and syrups on the side. These are usually high in calories, so you should limit the amount you eat.  If you want a salad, choose a garden salad and ask for grilled meats. Avoid extra toppings like bacon, cheese, or fried items. Ask for the dressing on the side, or ask for olive oil and vinegar or lemon to use as dressing.  Estimate how many servings of a food you are given. For example, a serving of cooked rice is  cup or about the size of half a baseball. Knowing serving sizes will help you be aware of how much food you are eating at restaurants. The list below tells you how big or small some common portion  sizes are based on everyday objects:  1 oz-4 stacked dice.  3 oz-1 deck of cards.  1 tsp-1 die.  1 Tbsp- a ping-pong ball.  2 Tbsp-1 ping-pong ball.   cup- baseball.  1 cup-1 baseball. Summary  Calorie counting means keeping track of how many calories you eat and drink each day. If you eat fewer calories than your body needs, you should lose weight.  A healthy amount of weight to lose per week is usually 1-2 lb (0.5-0.9 kg). This usually means reducing your daily calorie intake by 500-750 calories.  The number of calories in a food can be found on a Nutrition Facts label. If a food does not have a Nutrition Facts label, try to look up the calories online or ask your dietitian for help.  Use your calories on foods and drinks that will fill you up, and not on foods and drinks that will leave you hungry.  Use smaller plates, glasses, and bowls to prevent overeating. This information is not intended to replace advice given to you by your health care provider. Make sure you discuss any questions you have with your health care  provider. Document Released: 02/12/2005 Document Revised: 01/13/2016 Document Reviewed: 01/13/2016 Elsevier Interactive Patient Education  2017 [employer].

## 2016-06-22 LAB — COMPLETE METABOLIC PANEL WITH GFR
AG RATIO: 1.2 ratio (ref 1.0–2.5)
ALBUMIN: 3.8 g/dL (ref 3.6–5.1)
ALK PHOS: 52 U/L (ref 33–115)
ALT: 10 U/L (ref 6–29)
AST: 12 U/L (ref 10–35)
BUN / CREAT RATIO: 15.3 ratio (ref 6–22)
BUN: 15 mg/dL (ref 7–25)
CALCIUM: 9 mg/dL (ref 8.6–10.2)
CO2: 29 mmol/L (ref 20–31)
CREATININE: 0.98 mg/dL (ref 0.50–1.10)
Chloride: 103 mmol/L (ref 98–110)
GFR, Est African American: 80 mL/min (ref >=60)
GFR, Est Non African American: 69 mL/min (ref >=60)
GLUCOSE: 93 mg/dL (ref 65–99)
Globulin: 3.1 g/dL (ref 1.9–3.7)
POTASSIUM: 4.5 mmol/L (ref 3.5–5.3)
Sodium: 142 mmol/L (ref 135–146)
Total Bilirubin: 0.6 mg/dL (ref 0.2–1.2)
Total Protein: 6.9 g/dL (ref 6.1–8.1)

## 2016-06-22 LAB — THYROID PANEL WITH TSH
Free Thyroxine Index: 2.7 (ref 1.4–3.8)
T3 Uptake: 32 % (ref 22–35)
T4 TOTAL: 8.5 ug/dL (ref 4.5–12.0)
TSH: 1.78 m[IU]/L

## 2016-06-22 LAB — HEMOGLOBIN A1C
HEMOGLOBIN A1C: 4.9 % (ref <5.7)
Mean Plasma Glucose: 94 mg/dL

## 2016-06-22 LAB — LIPID PANEL
CHOL/HDL RATIO: 4.8 ratio (ref <5.0)
Cholesterol: 150 mg/dL (ref <200)
HDL: 31 mg/dL — AB (ref >50)
LDL Cholesterol: 89 mg/dL (ref <100)
TRIGLYCERIDES: 148 mg/dL (ref <150)
VLDL: 30 mg/dL (ref <30)

## 2016-07-04 MED FILL — LOSARTAN POTASSIUM 50 MG TA: 50 | 30 days supply | Qty: 30 | Fill #4

## 2016-07-06 MED FILL — FUROSEMIDE 40 MG TABLET: 40 | 30 days supply | Qty: 45 | Fill #3

## 2016-07-12 MED FILL — POTASSIUM CL ER 20 MEQ TAB: 20 | 30 days supply | Qty: 60 | Fill #4

## 2016-07-30 MED FILL — **SYMBICORT 80-4.5 MCG INHA: 80-4.5 | 15 days supply | Qty: 1 | Fill #1

## 2016-08-01 MED FILL — FUROSEMIDE 40 MG TABLET: 40 | 30 days supply | Qty: 45 | Fill #4

## 2016-08-07 MED FILL — LOSARTAN POTASSIUM 50 MG TA: 50 | 25 days supply | Qty: 25 | Fill #5

## 2016-08-13 ENCOUNTER — Ambulatory Visit: Payer: Self-pay | Admitting: Family Medicine

## 2016-08-13 MED FILL — POTASSIUM CL ER 20 MEQ TAB: 20 | 30 days supply | Qty: 60 | Fill #5

## 2016-08-15 ENCOUNTER — Encounter: Payer: Self-pay | Admitting: Cardiovascular Disease

## 2016-08-15 ENCOUNTER — Ambulatory Visit (INDEPENDENT_AMBULATORY_CARE_PROVIDER_SITE_OTHER): Payer: Self-pay | Admitting: Cardiovascular Disease

## 2016-08-15 VITALS — BP 120/80 | HR 75 | Ht 63.0 in | Wt 312.0 lb

## 2016-08-15 DIAGNOSIS — R252 Cramp and spasm: Secondary | ICD-10-CM

## 2016-08-15 DIAGNOSIS — I110 Hypertensive heart disease with heart failure: Secondary | ICD-10-CM

## 2016-08-15 DIAGNOSIS — I272 Pulmonary hypertension, unspecified: Secondary | ICD-10-CM

## 2016-08-15 DIAGNOSIS — G4733 Obstructive sleep apnea (adult) (pediatric): Secondary | ICD-10-CM

## 2016-08-15 DIAGNOSIS — I5042 Chronic combined systolic (congestive) and diastolic (congestive) heart failure: Secondary | ICD-10-CM

## 2016-08-15 DIAGNOSIS — Z79899 Other long term (current) drug therapy: Secondary | ICD-10-CM

## 2016-08-15 LAB — BASIC METABOLIC PANEL
BUN/Creatinine Ratio: 15 (ref 9–23)
BUN: 16 mg/dL (ref 6–24)
CALCIUM: 9.3 mg/dL (ref 8.7–10.2)
CO2: 27 mmol/L (ref 20–29)
CREATININE: 1.04 mg/dL — AB (ref 0.57–1.00)
Chloride: 101 mmol/L (ref 96–106)
GFR calc Af Amer: 74 mL/min/{1.73_m2} (ref >59)
GFR calc non Af Amer: 65 mL/min/{1.73_m2} (ref >59)
GLUCOSE: 87 mg/dL (ref 65–99)
Potassium: 4.7 mmol/L (ref 3.5–5.2)
SODIUM: 143 mmol/L (ref 134–144)

## 2016-08-15 LAB — MAGNESIUM: Magnesium: 2.1 mg/dL (ref 1.6–2.3)

## 2016-08-15 NOTE — Patient Instructions (Signed)
Medication Instructions: Your physician recommends that you continue on your current medications as directed. Please refer to the Current Medication list given to you today.   Labwork: Your physician recommends that you have lab work today BMET and Mag  Procedures/Testing: None Ordered  Follow-Up: Your physician wants you to follow-up in: 6 months with Dr. Duke Salvia. You will receive a reminder letter in the mail two months in advance. If you don't receive a letter, please call our office to schedule the follow-up appointment.   Any Additional Special Instructions Will Be Listed Below (If Applicable).     If you need a refill on your cardiac medications before your next appointment, please call your pharmacy.

## 2016-08-15 NOTE — Progress Notes (Signed)
Cardiology Office Note   Date:  08/15/2016   ID:  DELAYSIA Owens, DOB May 23, 1970, MRN [id number]  PCP:  [name], FNP  Cardiologist:   Chilton Si, MD   No chief complaint on file.   History of Present Illness: Jennifer Owens is a 46 y.o. 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  [name] was admitted to the hospital 6/21 through 7/5 2017 with hypoxic respiratory failure in the setting of acute systolic and diastolic heart failure and MSSA pneumonia. She required intubation and diuresis.  She was noted to have severe pulmonary hypertension.  She had an echo 08/24/15 that revealed LVEF 35-40% with a moderately reduced right ventricular function.  She then had a left heart cath on 08/29/15 that revealed no obstructive coronary disease and her ejection fraction was 40-50% with LVEDP 28 mmHg.  Her right heart catheterization revealed severe pulmonary hypertension with a PA pressure of 76/30.  She underwent a CT scan that was negative for pulmonary embolism.  After discharge she underwent a sleep study that revealed moderate obstructive sleep apnea and BiPAP was recommended.  Since her last appointment she has been doing well.  She denies chest pain and her breathing has been stable.  She denies edema, orthopnea or PND.  She has been using oxygen overnight and still can't afford a BiPAP.  She complains of cramps in her hands and legs.  She hasn't been exercising due to pain in her legs.  She would like to pursue the gastric sleeve operation, but her PCP recommended that she use traditional methods.  She is wondering whether she could take Lipozene, which she saw advertised online.    Past Medical History:  Diagnosis Date  . Asthma   . Chronic combined systolic and diastolic CHF (congestive heart failure) (HCC)    a.  Echo 5/17: mild LVH, EF 55-60%, no RWMA, Gr 1 DD, mildly dilated Ao root, trivial eff //  b. Echo 6/17: mild conc LVH, EF 35-40%, RVE with mod RV dysfunction  . Hypertensive heart disease with CHF (congestive heart failure) (HCC)   . Moderate to severe pulmonary hypertension (HCC)    a RHC 7/17 with PAP 76/30  . Morbid obesity (HCC)   . NICM (nonischemic cardiomyopathy) (HCC)    a. LHC 7/17 with normal cors  . Sciatica   . Thrombocytopenia (HCC)   . Tobacco abuse     Past Surgical History:  Procedure Laterality Date  . APPENDECTOMY    . CARDIAC CATHETERIZATION N/A 08/29/2015   Procedure: Right/Left Heart Cath and Coronary Angiography;  Surgeon: [name], MD;  Location: [facility] INVASIVE CV LAB;  Service: Cardiovascular;  Laterality: N/A;  . CESAREAN SECTION    . CHOLECYSTECTOMY    . SMALL INTESTINE SURGERY       Current Outpatient Prescriptions  Medication Sig Dispense Refill  . acetaminophen (TYLENOL) 500 MG tablet Take 1,000 mg by mouth every 4 (four) hours as needed for moderate pain or headache.    . albuterol (PROVENTIL HFA;VENTOLIN HFA) 108 (90 Base) MCG/ACT inhaler Inhale 2 puffs into the lungs every 6 (six) hours as needed for wheezing or shortness of breath. 1 Inhaler 0  . budesonide-formoterol (SYMBICORT) 80-4.5 MCG/ACT inhaler Inhale 2 puffs into the lungs 2 (two) times daily. 1 Inhaler 3  . carvedilol (COREG) 12.5 MG tablet Take  1 tablet (12.5 mg total) by mouth 2 (two) times daily with a meal. 180 tablet 3  . cetirizine (ZYRTEC) 10 MG tablet Take 1 tablet (10 mg total) by mouth daily. 30 tablet 11  . furosemide (LASIX) 40 MG tablet 1 tablet by mouth daily and as needed 135 tablet 3  . losartan (COZAAR) 50 MG tablet Take 1 tablet (50 mg total) by mouth daily. 90 tablet 3  . potassium chloride SA (K-DUR,KLOR-CON) 20 MEQ tablet Take 1 tablet (20 mEq total) by mouth 2 (two) times daily. 180 tablet 3   No current facility-administered medications for this visit.      Allergies:   Patient has no known allergies.    Social History:  The patient  reports that she quit smoking about 12 months ago. Her smoking use included Cigarettes. She has a 2.50 pack-year smoking history. She has never used smokeless tobacco. She reports that she does not drink alcohol or use drugs.   Family History:  The patient's family history includes Diabetes Mellitus II in her father and mother; Hypertension in her mother.    ROS:  Please see the history of present illness.   Otherwise, review of systems are positive for none.   All other systems are reviewed and negative.    PHYSICAL EXAM: VS:  BP 120/80   Pulse 75   Ht 5\' 3"  (1.6 m)   Wt (!) 141.5 kg (312 lb)   SpO2 96%   BMI 55.27 kg/m  , BMI Body mass index is 55.27 kg/m. GENERAL:  Well appearing.  Morbidly obese. HEENT:  Pupils equal round and reactive, fundi not visualized, oral mucosa unremarkable NECK:  No jugular venous distention, waveform within normal limits, carotid upstroke brisk and symmetric, no bruits LUNGS:  Clear to auscultation bilaterally. No crackles, wheezes, or rhonchi. HEART:  Distant heart sounds. RRR.  PMI not displaced or sustained,S1 and S2 within normal limits, no S3, no S4, no clicks, no rubs, no murmurs ABD:  Flat, positive bowel sounds normal in frequency in pitch, no bruits, no rebound, no guarding, no midline pulsatile mass, no hepatomegaly, no splenomegaly.  +RUQ tenderness to palpation.  EXT:  2 plus pulses throughout, no edema, no cyanosis no clubbing SKIN:  No rashes no nodules NEURO:  Cranial nerves II through XII grossly intact, motor grossly intact throughout PSYCH:  Cognitively intact, oriented to person place and time  EKG:  EKG is not ordered today. 04/25/16: Sinus rhythm. Rate 75 bpm. Nonspecific T wave abnormalities. QTc 460 ms.  LHC 08/29/15:  Normal coronary arteries LVF 40-50%.  LVEDP 28 mmHg  RHC 08/29/15:  RA 16, RV 64/15, PA 67/19, mean PA 43, PCWP 21  Echo  08/24/15: Study Conclusions  - Left ventricle: The cavity size was moderately dilated. There was   mild concentric hypertrophy. Systolic function was normal. Wall   motion was normal; there were no regional wall motion   abnormalities. The study is not technically sufficient to allow   evaluation of LV diastolic function. - Right ventricle: The cavity size was moderately dilated. Wall   thickness was normal. Systolic function was moderately reduced. - Pulmonic valve: Not visualized. - Pulmonary arteries: Systolic pressure couldn&'t be assessed. - Inferior vena cava: Not visualized. - Pericardium, extracardiac: There was no pericardial effusion.  Impressions:  - This is a very limited quality study.  Recent Labs: 08/17/2015: B Natriuretic Peptide 589.6 08/26/2015: Magnesium 2.2 06/21/2016: ALT 10; BUN 15; Creat 0.98; Hemoglobin 13.5; Platelets 75; Potassium 4.5;  Sodium 142; TSH 1.78    Lipid Panel    Component Value Date/Time   CHOL 150 06/21/2016 1018   TRIG 148 06/21/2016 1018   HDL 31 (L) 06/21/2016 1018   CHOLHDL 4.8 06/21/2016 1018   VLDL 30 06/21/2016 1018   LDLCALC 89 06/21/2016 1018      Wt Readings from Last 3 Encounters:  08/15/16 (!) 141.5 kg (312 lb)  06/21/16 (!) 141.1 kg (311 lb)  06/07/16 (!) 142.4 kg (314 lb)      ASSESSMENT AND PLAN:  # Severe pulmonary hypertension: # Chronic systolic and diastolic heart failure: # Hypertensive heart disease: Ms. Arntz is euvolemic today and doing well.  Her weight is stable.  Continue losartan and carvedilol.  She will get a BiPap machine as soon as she gets insurance.  Check a BMP and magnesium due to her cramping.  # Hypertension: Blood pressure is well-controlled.  Continue losartan and carvedilol. she inquired as to whether she can get off of her blood pressure medicine. We discussed the fact that her blood pressure is well-controlled because of the medicine she has stopped them at this time.   # OSA: She is  unable to afford BiPAP until she gets insurance.  She is unsure of when that will happen.   # Morbid obesity:  She is considering a gastric sleeve operation. Recommended at least 3 months of serious attempts at weight loss first. I suggested that she follow-up with the obesity medicine clinic. I would not recommend using over-the-counter weight loss supplements, as it is unclear what is actually in these medications. Weight loss medications can worsen pulmonary hypertension.  Current medicines are reviewed at length with the patient today.  The patient does not have concerns regarding medicines.  The following changes have been made:  None Labs/ tests ordered today include:   Orders Placed This Encounter  Procedures  . Magnesium  . Basic metabolic panel     Disposition:   FU with Cheney Ewart C. Duke Salvia, MD, [facility] in 6 months.    This note was written with the assistance of speech recognition software.  Please excuse any transcriptional errors.  Signed, Issaiah Seabrooks C. Duke Salvia, MD, [facility]  08/15/2016 1:09 PM    [facility]

## 2016-08-21 ENCOUNTER — Ambulatory Visit (INDEPENDENT_AMBULATORY_CARE_PROVIDER_SITE_OTHER): Payer: Self-pay | Admitting: Family Medicine

## 2016-08-21 ENCOUNTER — Encounter: Payer: Self-pay | Admitting: Family Medicine

## 2016-08-21 VITALS — BP 150/86 | HR 76 | Temp 98.0°F | Resp 14 | Ht 63.0 in | Wt 314.4 lb

## 2016-08-21 DIAGNOSIS — R82998 Other abnormal findings in urine: Secondary | ICD-10-CM

## 2016-08-21 DIAGNOSIS — I10 Essential (primary) hypertension: Secondary | ICD-10-CM

## 2016-08-21 DIAGNOSIS — I509 Heart failure, unspecified: Secondary | ICD-10-CM

## 2016-08-21 DIAGNOSIS — R8299 Other abnormal findings in urine: Secondary | ICD-10-CM

## 2016-08-21 DIAGNOSIS — M79673 Pain in unspecified foot: Secondary | ICD-10-CM

## 2016-08-21 LAB — POCT URINALYSIS DIP (DEVICE)
BILIRUBIN URINE: NEGATIVE
Glucose, UA: NEGATIVE mg/dL
Ketones, ur: NEGATIVE mg/dL
NITRITE: NEGATIVE
PH: 6 (ref 5.0–8.0)
Protein, ur: NEGATIVE mg/dL
Specific Gravity, Urine: 1.01 (ref 1.005–1.030)
Urobilinogen, UA: 0.2 mg/dL (ref 0.0–1.0)

## 2016-08-21 NOTE — Patient Instructions (Addendum)
Continue to measure weight daily. Any weight gain greater than 2 pounds less than 2-1/2 pounds increase dose of Lasix for that day to 80 mg. If he experienced weight gain of 5 pounds or greater within 1 week follow-up here at my office for further evaluation. He developed weight gain in addition to cough and shortness of breath go immediately to the emergency department.  Schedule nurse visit for the end of September to obtain her influenza vaccine. Schedule follow-up for chronic disease management with me in 6 months. If you need to be seen sooner for any of acute needs,  please call to schedule an appointment.   Heart Failure Heart failure means your heart has trouble pumping blood. This makes it hard for your body to work well. Heart failure is usually a long-term (chronic) condition. You must take good care of yourself and follow your doctor's treatment plan. Follow these instructions at home:  Take your heart medicine as told by your doctor. ? Do not stop taking medicine unless your doctor tells you to. ? Do not skip any dose of medicine. ? Refill your medicines before they run out. ? Take other medicines only as told by your doctor or pharmacist.  Stay active if told by your doctor. The elderly and people with severe heart failure should talk with a doctor about physical activity.  Eat heart-healthy foods. Choose foods that are without trans fat and are low in saturated fat, cholesterol, and salt (sodium). This includes fresh or frozen fruits and vegetables, fish, lean meats, fat-free or low-fat dairy foods, whole grains, and high-fiber foods. Lentils and dried peas and beans (legumes) are also good choices.  Limit salt if told by your doctor.  Cook in a healthy way. Roast, grill, broil, bake, poach, steam, or stir-fry foods.  Limit fluids as told by your doctor.  Weigh yourself every morning. Do this after you pee (urinate) and before you eat breakfast. Write down your weight to  give to your doctor.  Take your blood pressure and write it down if your doctor tells you to.  Ask your doctor how to check your pulse. Check your pulse as told.  Lose weight if told by your doctor.  Stop smoking or chewing tobacco. Do not use gum or patches that help you quit without your doctor's approval.  Schedule and go to doctor visits as told.  Nonpregnant women should have no more than 1 drink a day. Men should have no more than 2 drinks a day. Talk to your doctor about drinking alcohol.  Stop illegal drug use.  Stay current with shots (immunizations).  Manage your health conditions as told by your doctor.  Learn to manage your stress.  Rest when you are tired.  If it is really hot outside: ? Avoid intense activities. ? Use air conditioning or fans, or get in a cooler place. ? Avoid caffeine and alcohol. ? Wear loose-fitting, lightweight, and light-colored clothing.  If it is really cold outside: ? Avoid intense activities. ? Layer your clothing. ? Wear mittens or gloves, a hat, and a scarf when going outside. ? Avoid alcohol.  Learn about heart failure and get support as needed.  Get help to maintain or improve your quality of life and your ability to care for yourself as needed. Contact a doctor if:  You gain weight quickly.  You are more short of breath than usual.  You cannot do your normal activities.  You tire easily.  You cough more than  normal, especially with activity.  You have any or more puffiness (swelling) in areas such as your hands, feet, ankles, or belly (abdomen).  You cannot sleep because it is hard to breathe.  You feel like your heart is beating fast (palpitations).  You get dizzy or light-headed when you stand up. Get help right away if:  You have trouble breathing.  There is a change in mental status, such as becoming less alert or not being able to focus.  You have chest pain or discomfort.  You faint. This information  is not intended to replace advice given to you by your health care provider. Make sure you discuss any questions you have with your health care provider. Document Released: 11/22/2007 Document Revised: 07/21/2015 Document Reviewed: 03/31/2012 Elsevier Interactive Patient Education  2017 [employer].

## 2016-08-21 NOTE — Progress Notes (Signed)
Patient ID: Jennifer Owens, female    DOB: 1970/03/23, 46 y.o.   MRN: [id number]  PCP: [name], FNP  Chief Complaint  Patient presents with  . Follow-up    6 MONTH    Subjective:  HPI Jennifer Owens is a 46 y.o. female presents for routine 6 month chronic disease management. Medical problems: Morbidly Obese, OSA, Pulmonary Hypertension, Nonischemic Cardiomyopathy, CHF, Asthma, Former Smoker. Reports good control over her asthma. She denies any recent weight gain, or worsening shortness of breath compared to her normal level of dyspnea. She also suffers from obstructive sleep apnea however has been unable to afford a BiPAP machine as she is uninsured and therefore uses 2 L of oxygen at bedtime only. She has noticed some mild lower extremity feet swelling in spite of taking chronic Lasix 40 mg daily as needed for lower extremity swelling. She admits to not performing daily weights to monitor status of heart failure. Her last cardiology visit was 08/15/2016. At present, Jennifer Owens reports no efforts to lose weight, she is morbidly obese with a Body mass index is 55.69 kg/m. She is uninsured and confirms that she has not reapplied Medicaid since 2017, nor has she applied for [employer], or [employer] patient assistance recently. She still reports that she is interested in surgical intervention for weight loss. During her last office visit on 06/21/2016 she committed to a goal weight loss of 5 pounds. According to today's weight she's actually gained 4 pounds. Social History   Social History  . Marital status: Legally Separated    Spouse name: N/A  . Number of children: N/A  . Years of education: N/A   Occupational History  . 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 Smoking status: Former Smoker    Packs/day: 0.25    Years: 10.00    Types: Cigarettes    Quit date: 07/2015  . Smokeless tobacco: Never Used  . Alcohol use No     Comment: very rarely - 1-2  drinks/month  . Drug use: No  . Sexual activity: Not on file   Other Topics Concern  . Not on file   Social History Narrative  . No narrative on file    Family History  Problem Relation Age of Onset  . Diabetes Mellitus II Mother   . Hypertension Mother   . Diabetes Mellitus II Father    Review of Systems See history of present illness  Patient Active Problem List   Diagnosis Date Noted  . NICM (nonischemic cardiomyopathy) (HCC) 09/06/2015  . Pulmonary hypertension (HCC)   . Chronic combined systolic and diastolic heart failure (HCC)   . Morbid obesity (HCC)   . Hypertensive heart disease 08/17/2015  . Hypoxia 07/22/2015  . Asthma 07/22/2015  . Thrombocytopenia (HCC) 07/22/2015  . Tobacco abuse 07/22/2015    No Known Allergies  Prior to Admission medications   Medication Sig Start Date End Date Taking? Authorizing Provider  acetaminophen (TYLENOL) 500 MG tablet Take 1,000 mg by mouth every 4 (four) hours as needed for moderate pain or headache.   Yes [provider]  albuterol (PROVENTIL HFA;VENTOLIN HFA) 108 (90 Base) MCG/ACT inhaler Inhale 2 puffs into the lungs every 6 (six) hours as needed for wheezing or shortness of breath. 06/07/16  Yes [name], FNP  budesonide-formoterol (SYMBICORT) 80-4.5 MCG/ACT inhaler Inhale 2 puffs into the lungs 2 (two) times daily. 06/21/16  Yes [name], FNP  carvedilol (COREG) 12.5 MG tablet Take 1 tablet (12.5 mg total) by mouth 2 (two) times daily with a meal. 03/01/16  Yes Chilton Si, MD  cetirizine (ZYRTEC) 10 MG tablet Take 1 tablet (10 mg total) by mouth daily. 06/21/16  Yes [name], FNP  furosemide (LASIX) 40 MG tablet 1 tablet by mouth daily and as needed 03/01/16  Yes Chilton Si, MD  losartan (COZAAR) 50 MG tablet Take 1 tablet (50 mg total) by mouth daily. 03/01/16  Yes Chilton Si, MD  potassium chloride SA (K-DUR,KLOR-CON) 20 MEQ tablet Take 1 tablet (20 mEq total) by mouth 2  (two) times daily. 03/01/16  Yes Chilton Si, MD    Past Medical, Surgical Family and Social History reviewed and updated.    Objective:   Today's Vitals   08/21/16 1334  BP: (!) 150/86  Pulse: 76  Resp: 14  Temp: 98 F (36.7 C)  TempSrc: Oral  SpO2: 94%  Weight: (!) 314 lb 6.4 oz (142.6 kg)  Height: 5\' 3"  (1.6 m)     Wt Readings from Last 3 Encounters:  08/21/16 (!) 314 lb 6.4 oz (142.6 kg)  08/15/16 (!) 312 lb (141.5 kg)  06/21/16 (!) 311 lb (141.1 kg)    Physical Exam  Constitutional: She is oriented to person, place, and time. She appears well-developed and well-nourished.  HENT:  Head: Normocephalic and atraumatic.  Eyes: Conjunctivae and EOM are normal. Pupils are equal, round, and reactive to light.  Neck: Normal range of motion. Neck supple.  Cardiovascular: Normal rate, regular rhythm, normal heart sounds and intact distal pulses.   Pulmonary/Chest: Effort normal. She has decreased breath sounds in the right upper field, the right middle field, the right lower field, the left upper field, the left middle field and the left lower field. She has no wheezes. She has no rhonchi. She has no rales.  Musculoskeletal: She exhibits edema.  Bilateral lower extremity swelling non-pitting  Lymphadenopathy:    She has no cervical adenopathy.  Neurological: She is alert and oriented to person, place, and time.  Skin: Skin is warm and dry.  Psychiatric: She has a normal mood and affect. Her behavior is normal. Judgment and thought content normal.   Assessment & Plan:  1. Essential hypertension, elevated today. -Continue Coreg 12.5 mg 2 times daily -Continue losartan 50 mg daily  2. Congestive heart failure, unspecified HF chronicity, unspecified heart failure type (HCC) -Continue Lasix 40 mg as needed however if you have over a 2 pound weight gain within 24 hours increase Lasix dose to 80 mg. Anytime you experienced greater than a 5 pound weight gain within one week  notify me here at the clinic and if symptomatic with shortness of breath, cough, significant edema, regardless of weight, go immediately to the emergency department for further evaluation.  3. Leukocytes in urine - Urine Culture  RTC: In September for influenza vaccination and month visit for chronic disease management. Also whenever you obtain any disability paperwork please submit here at the office for completion.   [name]. Tiburcio Pea, MSN, FNP-C [facility]  [address] [place], [place] [address] [phone]

## 2016-08-23 ENCOUNTER — Telehealth: Payer: Self-pay | Admitting: Family Medicine

## 2016-08-23 NOTE — Telephone Encounter (Signed)
Erroneous

## 2016-08-24 LAB — URINE CULTURE

## 2016-08-24 MED ORDER — CEPHALEXIN 500 MG PO CAPS: 500.0000 mg | ORAL_CAPSULE | Freq: Two times a day (BID) | ORAL | 0 refills | Status: DC

## 2016-08-24 NOTE — Addendum Note (Signed)
Addended by: [name] on: 08/24/2016 01:02 PM   Modules accepted: Orders

## 2016-08-27 ENCOUNTER — Telehealth: Payer: Self-pay

## 2016-08-27 MED ORDER — CEPHALEXIN 500 MG PO CAPS: 500.0000 mg | ORAL_CAPSULE | Freq: Two times a day (BID) | ORAL | 0 refills | Status: DC

## 2016-08-27 MED FILL — ?FUROSEMIDE 40 MG TABLET: 40 | 30 days supply | Qty: 45 | Fill #5

## 2016-08-27 MED FILL — CEPHALEXIN 500 MG CAPSULE: 500 | 10 days supply | Qty: 20 | Fill #0

## 2016-08-27 NOTE — Telephone Encounter (Signed)
Keflex was sent to [facility]. Tried to call patient no vm setup

## 2016-08-31 MED FILL — SYMBICORT 80-4.5 MCG INH: 80-4.5 | 30 days supply | Qty: 10 | Fill #2

## 2016-08-31 MED FILL — LOSARTAN POTASSIUM 50 MG TA: 50 | 30 days supply | Qty: 30 | Fill #6

## 2016-09-10 ENCOUNTER — Other Ambulatory Visit: Payer: Self-pay | Admitting: Family Medicine

## 2016-09-10 MED ORDER — FLUTICASONE FUROATE-VILANTEROL 100-25 MCG/INH IN AEPB: 1.0000 | INHALATION_SPRAY | Freq: Every day | RESPIRATORY_TRACT | 1 refills | Status: DC

## 2016-09-10 MED ORDER — FLUTICASONE FUROATE-VILANTEROL 100-25 MCG/INH IN AEPB
1.0000 | INHALATION_SPRAY | Freq: Every day | RESPIRATORY_TRACT | 6 refills | Status: DC
Start: 2016-09-10 — End: 2016-09-10

## 2016-09-10 NOTE — Progress Notes (Signed)
Discontinued Symbicort and ordered Breo due to availability at [facility].

## 2016-09-11 ENCOUNTER — Other Ambulatory Visit: Payer: Self-pay | Admitting: Family Medicine

## 2016-09-11 MED FILL — POTASSIUM CL ER 20 MEQ TAB: 20 | 30 days supply | Qty: 60 | Fill #6

## 2016-09-11 MED FILL — !VENTOLIN HFA INHALER: 108 (90 BAS | 25 days supply | Qty: 18 | Fill #0

## 2016-09-28 MED FILL — LOSARTAN POTASSIUM 50 MG TA: 50 | 30 days supply | Qty: 30 | Fill #7

## 2016-10-11 ENCOUNTER — Other Ambulatory Visit: Payer: Self-pay | Admitting: Family Medicine

## 2016-10-11 MED FILL — SYMBICORT 80-4.5 MCG INH: 80-4.5 | 30 days supply | Qty: 10 | Fill #3

## 2016-10-11 MED FILL — POTASSIUM CL ER 20 MEQ TAB: 20 | 30 days supply | Qty: 60 | Fill #7

## 2016-10-18 MED FILL — LOSARTAN POTASSIUM 50 MG TA: 50 | 30 days supply | Qty: 30 | Fill #8

## 2016-10-18 MED FILL — FUROSEMIDE 40 MG TABLET: 40 | 30 days supply | Qty: 45 | Fill #6

## 2016-10-26 ENCOUNTER — Other Ambulatory Visit: Payer: Self-pay | Admitting: *Deleted

## 2016-10-26 MED ORDER — ALBUTEROL SULFATE HFA 108 (90 BASE) MCG/ACT IN AERS: 2.0000 | INHALATION_SPRAY | Freq: Four times a day (QID) | RESPIRATORY_TRACT | 3 refills | Status: DC | PRN

## 2016-10-26 MED ORDER — FLUTICASONE FUROATE-VILANTEROL 100-25 MCG/INH IN AEPB: 1.0000 | INHALATION_SPRAY | Freq: Every day | RESPIRATORY_TRACT | 3 refills | Status: DC

## 2016-10-26 NOTE — Telephone Encounter (Signed)
PRINTED FOR PASS PROGRAM 

## 2016-11-13 ENCOUNTER — Ambulatory Visit (INDEPENDENT_AMBULATORY_CARE_PROVIDER_SITE_OTHER): Payer: Self-pay

## 2016-11-13 DIAGNOSIS — Z23 Encounter for immunization: Secondary | ICD-10-CM

## 2016-11-13 MED FILL — FUROSEMIDE 40 MG TABLET: 40 | 30 days supply | Qty: 45 | Fill #7

## 2016-11-19 ENCOUNTER — Other Ambulatory Visit: Payer: Self-pay | Admitting: Family Medicine

## 2016-11-19 MED FILL — POTASSIUM CL ER 20 MEQ TAB: 20 | 30 days supply | Qty: 60 | Fill #8

## 2016-11-19 MED FILL — SYMBICORT 80-4.5 MCG INH: 80-4.5 | 30 days supply | Qty: 10 | Fill #0

## 2016-11-27 MED FILL — LOSARTAN POTASSIUM 50 MG TA: 50 | 30 days supply | Qty: 30 | Fill #9

## 2016-11-28 MED FILL — ?CETIRIZINE HCL 10 MG TABLE: 10 | 30 days supply | Qty: 30 | Fill #1

## 2016-12-17 ENCOUNTER — Encounter: Payer: Self-pay | Admitting: Family Medicine

## 2016-12-17 ENCOUNTER — Ambulatory Visit (INDEPENDENT_AMBULATORY_CARE_PROVIDER_SITE_OTHER): Payer: Medicaid Other | Admitting: Family Medicine

## 2016-12-17 VITALS — BP 130/84 | HR 84 | Temp 98.0°F | Ht 63.0 in | Wt 324.0 lb

## 2016-12-17 DIAGNOSIS — J209 Acute bronchitis, unspecified: Secondary | ICD-10-CM

## 2016-12-17 DIAGNOSIS — H1033 Unspecified acute conjunctivitis, bilateral: Secondary | ICD-10-CM | POA: Diagnosis not present

## 2016-12-17 DIAGNOSIS — J4540 Moderate persistent asthma, uncomplicated: Secondary | ICD-10-CM | POA: Diagnosis not present

## 2016-12-17 MED ORDER — BENZONATATE 100 MG PO CAPS: 100.0000 mg | ORAL_CAPSULE | Freq: Three times a day (TID) | ORAL | 0 refills | Status: DC | PRN

## 2016-12-17 MED ORDER — ERYTHROMYCIN 5 MG/GM OP OINT: 1.0000 "application " | TOPICAL_OINTMENT | Freq: Every day | OPHTHALMIC | 0 refills | Status: AC

## 2016-12-17 MED ORDER — AMOXICILLIN-POT CLAVULANATE 875-125 MG PO TABS: 1.0000 | ORAL_TABLET | Freq: Two times a day (BID) | ORAL | 0 refills | Status: DC

## 2016-12-17 MED ORDER — FLUCONAZOLE 150 MG PO TABS: 150.0000 mg | ORAL_TABLET | Freq: Once | ORAL | 0 refills | Status: AC

## 2016-12-17 MED FILL — AMOX-CLAV 875-125 MG TABLET: 875-125 | 10 days supply | Qty: 20 | Fill #0

## 2016-12-17 MED FILL — BENZONATATE 100 MG CAPSULE: 100 | 10 days supply | Qty: 60 | Fill #0

## 2016-12-17 MED FILL — ERYTHROMYCIN EYE OINTMENT: 5 | 15 days supply | Qty: 4 | Fill #0

## 2016-12-17 MED FILL — FLUCONAZOLE 150 MG TABLET: 150 | 1 days supply | Qty: 1 | Fill #0

## 2016-12-17 NOTE — Patient Instructions (Signed)
Take Augmentin 1 tablet twice daily x 10 days. In the event you develop vaginal irritation, I have prescribed Diflucan 150 mg.  For cough, you can take Benzonatate 100-200 mg up to 3 times daily as needed for cough.  Apply 1 ribbon erythromycin to both eyes twice daily.

## 2016-12-17 NOTE — Progress Notes (Signed)
Patient ID: Jennifer Owens, female    DOB: 02/20/1971, 46 y.o.   MRN: [id number]  PCP: Bing NeighborsHarris, Cliffard Hair S, FNP  Chief Complaint  Patient presents with  . Nasal Congestion  . Sore Throat  . Headache  . Conjunctivitis  . Cough    Subjective:  HPI Jennifer Owens is a 46 y.o. female presents for evaluation illness and eye infection. Jennifer StanleyLisa reports a 3 day history significant for nasal congestion, sore throat, headache, bilateral eye itching and redness, shortness of breath, and chest tightness. She reports subjective feeling of fever. Cough is productive of yellow and green mucus. Reports consistent use of asthma inhalers. Unknown if she has encountered recent sick contacts. Social History   Social History  . Marital status: Legally Separated    Spouse name: N/A  . Number of children: N/A  . Years of education: N/A   Occupational History  . 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 Smoking status: Former Smoker    Packs/day: 0.25    Years: 10.00    Types: Cigarettes    Quit date: 07/2015  . Smokeless tobacco: Never Used  . Alcohol use No     Comment: very rarely - 1-2 drinks/month  . Drug use: No  . Sexual activity: Not on file   Other Topics Concern  . Not on file   Social History Narrative  . No narrative on file    Family History  Problem Relation Age of Onset  . Diabetes Mellitus II Mother   . Hypertension Mother   . Diabetes Mellitus II Father    Review of Systems SEE HPI   Patient Active Problem List   Diagnosis Date Noted  . NICM (nonischemic cardiomyopathy) (HCC) 09/06/2015  . Pulmonary hypertension (HCC)   . Chronic combined systolic and diastolic heart failure (HCC)   . Morbid obesity (HCC)   . Hypertensive heart disease 08/17/2015  . Hypoxia 07/22/2015  . Asthma 07/22/2015  . Thrombocytopenia (HCC) 07/22/2015  . Tobacco abuse 07/22/2015    No Known Allergies  Prior to Admission medications   Medication Sig Start Date  End Date Taking? Authorizing Provider  acetaminophen (TYLENOL) 500 MG tablet Take 1,000 mg by mouth every 4 (four) hours as needed for moderate pain or headache.   Yes [provider]  albuterol (VENTOLIN HFA) 108 (90 Base) MCG/ACT inhaler Inhale 2 puffs into the lungs every 6 (six) hours as needed for wheezing or shortness of breath. 10/26/16  Yes Quentin AngstJegede, Olugbemiga E, MD  carvedilol (COREG) 12.5 MG tablet Take 1 tablet (12.5 mg total) by mouth 2 (two) times daily with a meal. 03/01/16  Yes Chilton Siandolph, Tiffany, MD  cetirizine (ZYRTEC) 10 MG tablet Take 1 tablet (10 mg total) by mouth daily. 06/21/16  Yes Bing NeighborsHarris, Nahshon Reich S, FNP  fluticasone furoate-vilanterol (BREO ELLIPTA) 100-25 MCG/INH AEPB Inhale 1 puff into the lungs daily. 10/26/16  Yes Quentin AngstJegede, Olugbemiga E, MD  furosemide (LASIX) 40 MG tablet 1 tablet by mouth daily and as needed 03/01/16  Yes Chilton Siandolph, Tiffany, MD  losartan (COZAAR) 50 MG tablet Take 1 tablet (50 mg total) by mouth daily. 03/01/16  Yes Chilton Siandolph, Tiffany, MD  potassium chloride SA (K-DUR,KLOR-CON) 20 MEQ tablet Take 1 tablet (20 mEq total) by mouth 2 (two) times daily. 03/01/16  Yes Chilton Siandolph, Tiffany, MD  SYMBICORT 80-4.5 MCG/ACT inhaler INHALE 2 PUFFS INTO THE LUNGS 2 TIMES DAILY. 11/19/16  Yes Bing NeighborsHarris, Paulina Muchmore S, FNP  SYMBICORT 80-4.5  MCG/ACT inhaler INHALE 2 PUFFS INTO THE LUNGS 2 TIMES DAILY. Patient not taking: Reported on 12/17/2016 10/11/16   [name], FNP    Past Medical, Surgical Family and Social History reviewed and updated.    Objective:   Today's Vitals   12/17/16 1019  BP: 130/84  Pulse: 84  Temp: 98 F (36.7 C)  TempSrc: Oral  SpO2: 97%  Weight: (!) 324 lb (147 kg)  Height: 5\' 3"  (1.6 m)    Wt Readings from Last 3 Encounters:  12/17/16 (!) 324 lb (147 kg)  08/21/16 (!) 314 lb 6.4 oz (142.6 kg)  08/15/16 (!) 312 lb (141.5 kg)   Depression screen [facility] 2/9 12/17/2016 08/21/2016 06/21/2016 06/07/2016 06/07/2016  Decreased Interest 0 1 0 0 0   Down, Depressed, Hopeless 0 1 0 0 0  PHQ - 2 Score 0 2 0 0 0  Altered sleeping - 3 - - -  Tired, decreased energy - 1 - - -  Change in appetite - 2 - - -  Feeling bad or failure about yourself  - 3 - - -  Trouble concentrating - 1 - - -  Moving slowly or fidgety/restless - 0 - - -  Suicidal thoughts - 1 - - -  PHQ-9 Score - 13 - - -   Physical Exam  Constitutional: She is oriented to person, place, and time. She appears well-developed and well-nourished.  HENT:  Head: Normocephalic and atraumatic.  Nose: Mucosal edema and rhinorrhea present.  Mouth/Throat: Oropharynx is clear and moist.  Eyes: Pupils are equal, round, and reactive to light. Right eye exhibits discharge and exudate. Left eye exhibits discharge and exudate. Right conjunctiva is injected. Left conjunctiva is injected.  Scleral erythema in both eyes  Neck: Normal range of motion. Neck supple.  Cardiovascular: Normal rate, regular rhythm, normal heart sounds and intact distal pulses.   Pulmonary/Chest: Effort normal. She has wheezes. She has rhonchi.  Musculoskeletal: Normal range of motion.  Neurological: She is alert and oriented to person, place, and time.  Skin: Skin is warm and dry.  Psychiatric: She has a normal mood and affect. Her behavior is normal. Judgment and thought content normal.   Assessment & Plan:  1. Acute bacterial conjunctivitis of both eyes -Start erythromycin 1 ribbon in both eyes twice daily x 7 days  2. Acute bronchitis, unspecified organism, patient appears visibly ill and notable rhonchi and upper chest congestion present on exam.  Start Augmentin 1 tablet, every 12 hours x 10 days.  Diflucan 150 mg, once, if vaginal irritation develops. 3. Moderate persistent asthma, unspecified whether complicated - Continue SABA and LABA  Therapy  Return for follow if symptoms worsen, otherwise, keep scheduled follow-up.    Meds ordered this encounter  Medications  . amoxicillin-clavulanate  (AUGMENTIN) 875-125 MG tablet    Sig: Take 1 tablet by mouth 2 (two) times daily.    Dispense:  20 tablet    Refill:  0    Order Specific Question:   Supervising Provider    Answer:   Quentin Angst [id number]  . benzonatate (TESSALON) 100 MG capsule    Sig: Take 1-2 capsules (100-200 mg total) by mouth 3 (three) times daily as needed for cough.    Dispense:  60 capsule    Refill:  0    Order Specific Question:   Supervising Provider    Answer:   Quentin Angst [id number]  . erythromycin [facility]) ophthalmic ointment    Sig: Place 1  application into both eyes at bedtime.    Dispense:  3.5 g    Refill:  0    Order Specific Question:   Supervising Provider    Answer:   Quentin Angst [id number]  . fluconazole (DIFLUCAN) 150 MG tablet    Sig: Take 1 tablet (150 mg total) by mouth once.    Dispense:  1 tablet    Refill:  0    Order Specific Question:   Supervising Provider    Answer:   Quentin Angst [[id number]]     [id number] B. Tiburcio Pea, MSN, FNP-C [facility]  [address] [place], [place] [address] [phone]

## 2016-12-24 MED FILL — LOSARTAN POTASSIUM 50 MG TA: 50 | 30 days supply | Qty: 30 | Fill #10

## 2016-12-24 MED FILL — POTASSIUM CL ER 20 MEQ TAB: 20 | 30 days supply | Qty: 60 | Fill #9

## 2017-01-01 ENCOUNTER — Ambulatory Visit (INDEPENDENT_AMBULATORY_CARE_PROVIDER_SITE_OTHER): Payer: Medicaid Other | Admitting: Family Medicine

## 2017-01-01 ENCOUNTER — Encounter: Payer: Self-pay | Admitting: Family Medicine

## 2017-01-01 VITALS — BP 135/84 | HR 84 | Temp 98.2°F | Resp 16 | Ht 63.0 in | Wt 327.0 lb

## 2017-01-01 DIAGNOSIS — J4540 Moderate persistent asthma, uncomplicated: Secondary | ICD-10-CM | POA: Diagnosis not present

## 2017-01-01 DIAGNOSIS — R059 Cough, unspecified: Secondary | ICD-10-CM

## 2017-01-01 DIAGNOSIS — R05 Cough: Secondary | ICD-10-CM

## 2017-01-01 MED ORDER — BUDESONIDE-FORMOTEROL FUMARATE 80-4.5 MCG/ACT IN AERO: INHALATION_SPRAY | RESPIRATORY_TRACT | 3 refills | Status: AC

## 2017-01-01 MED ORDER — MELOXICAM 15 MG PO TABS: 15.0000 mg | ORAL_TABLET | Freq: Every day | ORAL | 0 refills | Status: DC

## 2017-01-01 MED FILL — MELOXICAM 15 MG TABLET: 15 | 30 days supply | Qty: 30 | Fill #0

## 2017-01-01 NOTE — Progress Notes (Signed)
Patient ID: Jennifer Owens, female    DOB: [birth year], 46 y.o.   MRN: [id number]  PCP: [name], FNP  Chief Complaint  Patient presents with  . Follow-up    2 weeks    Subjective:  HPI Jennifer Owens is a 46 y.o. female presents for 2 week follow-up. Jennifer Owens was seen in office on 12/17/2016 and was treated for bacterial conjunctivitis, sinusitis, and an acute asthma exacerbation. Today, she reports improvement of symptoms with the exception of a lingering cough. Cough is controlled with benzonatate. She continues to use, albuterol inhaler for chronic shortness of breath, almost daily. Jennifer Owens and misplaced her Symbicort and requests a refill. She denies chest tightness, sputum productions, lower extremity edema, or worsening dyspnea beyond her normal baseline.  Immunization History  Administered Date(s) Administered  . Influenza,inj,Quad PF,6+ Mos 11/28/2015, 11/13/2016  . Pneumococcal Polysaccharide-23 07/23/2015  . Tdap 06/27/2015   Social History   Socioeconomic History  . Marital status: Legally Separated    Spouse name: Not on file  . Number of children: Not on file  . Years of education: Not on file  . Highest education level: Not on file  Social Needs  . Financial resource strain: Not on file  . Food insecurity - worry: Not on file  . Food insecurity - inability: Not on file  . Transportation needs - medical: Not on file  . Transportation needs - non-medical: Not on file  Occupational History  . Occupation: CNA for [employer]  Tobacco Use  . Smoking status: Former Smoker    Packs/day: 0.25    Years: 10.00    Pack years: 2.50    Types: Cigarettes    Last attempt to quit: 07/2015    Years since quitting: 1.4  . Smokeless tobacco: Never Used  Substance and Sexual Activity  . Alcohol use: No    Alcohol/week: 0.6 oz    Types: 1 Standard drinks or equivalent per week    Comment: very rarely - 1-2 drinks/month  . Drug use: No  .  Sexual activity: Not on file  Other Topics Concern  . Not on file  Social History Narrative  . Not on file    Family History  Problem Relation Age of Onset  . Diabetes Mellitus II Mother   . Hypertension Mother   . Diabetes Mellitus II Father    Review of Systems See HPI  Patient Active Problem List   Diagnosis Date Noted  . NICM (nonischemic cardiomyopathy) (HCC) 09/06/2015  . Pulmonary hypertension (HCC)   . Chronic combined systolic and diastolic heart failure (HCC)   . Morbid obesity (HCC)   . Hypertensive heart disease 08/17/2015  . Hypoxia 07/22/2015  . Asthma 07/22/2015  . Thrombocytopenia (HCC) 07/22/2015  . Tobacco abuse 07/22/2015    No Known Allergies  Prior to Admission medications   Medication Sig Start Date End Date Taking? Authorizing Provider  acetaminophen (TYLENOL) 500 MG tablet Take 1,000 mg by mouth every 4 (four) hours as needed for moderate pain or headache.   Yes [provider]  albuterol (VENTOLIN HFA) 108 (90 Base) MCG/ACT inhaler Inhale 2 puffs into the lungs every 6 (six) hours as needed for wheezing or shortness of breath. 10/26/16  Yes Quentin Angst, MD  benzonatate (TESSALON) 100 MG capsule Take 1-2 capsules (100-200 mg total) by mouth 3 (three) times daily as needed for cough. 12/17/16  Yes [name], FNP  carvedilol (COREG) 12.5 MG  tablet Take 1 tablet (12.5 mg total) by mouth 2 (two) times daily with a meal. 03/01/16  Yes Chilton Siandolph, Tiffany, MD  cetirizine (ZYRTEC) 10 MG tablet Take 1 tablet (10 mg total) by mouth daily. 06/21/16  Yes Bing NeighborsHarris, Doloras Tellado S, FNP  fluticasone furoate-vilanterol (BREO ELLIPTA) 100-25 MCG/INH AEPB Inhale 1 puff into the lungs daily. 10/26/16  Yes Quentin AngstJegede, Olugbemiga E, MD  furosemide (LASIX) 40 MG tablet 1 tablet by mouth daily and as needed 03/01/16  Yes Chilton Siandolph, Tiffany, MD  losartan (COZAAR) 50 MG tablet Take 1 tablet (50 mg total) by mouth daily. 03/01/16  Yes Chilton Siandolph, Tiffany, MD  potassium  chloride SA (K-DUR,KLOR-CON) 20 MEQ tablet Take 1 tablet (20 mEq total) by mouth 2 (two) times daily. 03/01/16  Yes Chilton Siandolph, Tiffany, MD  SYMBICORT 80-4.5 MCG/ACT inhaler INHALE 2 PUFFS INTO THE LUNGS 2 TIMES DAILY. 10/11/16  Yes Bing NeighborsHarris, Johnjoseph Rolfe S, FNP  SYMBICORT 80-4.5 MCG/ACT inhaler INHALE 2 PUFFS INTO THE LUNGS 2 TIMES DAILY. 11/19/16  Yes Bing NeighborsHarris, Nidia Grogan S, FNP  amoxicillin-clavulanate (AUGMENTIN) 875-125 MG tablet Take 1 tablet by mouth 2 (two) times daily. Patient not taking: Reported on 01/01/2017 12/17/16   Bing NeighborsHarris, Gael Delude S, FNP    Past Medical, Surgical Family and Social History reviewed and updated.    Objective:   Today's Vitals   01/01/17 1018  BP: 135/84  Pulse: 84  Resp: 16  Temp: 98.2 F (36.8 C)  TempSrc: Oral  SpO2: 98%  Weight: (!) 327 lb (148.3 kg)  Height: 5\' 3"  (1.6 m)    Wt Readings from Last 3 Encounters:  01/01/17 (!) 327 lb (148.3 kg)  12/17/16 (!) 324 lb (147 kg)  08/21/16 (!) 314 lb 6.4 oz (142.6 kg)    Physical Exam Constitutional: Patient appears well-developed and well-nourished. No distress. HENT: Normocephalic, atraumatic, External right and left ear normal. Oropharynx is clear and moist.  Eyes: Conjunctivae and EOM are normal. PERRLA, no scleral icterus. Neck: Normal ROM. Neck supple. No JVD. No tracheal deviation. No thyromegaly. CVS: RRR, S1/S2 +, no murmurs, no gallops, no carotid bruit.  Pulmonary: Effort normal. Breath sounds mildly diminished. No stridor, rhonchi, wheezes, rales.  Abdominal: Soft. BS +, no distension, tenderness, rebound or guarding.  Musculoskeletal: Normal range of motion. No edema and no tenderness.  Lymphadenopathy: No lymphadenopathy noted, cervical, inguinal or axillary Neuro: Alert. Normal reflexes, muscle tone coordination. No cranial nerve deficit. Skin: Skin is warm and dry. No rash noted. Not diaphoretic. No erythema. No pallor. Psychiatric: Normal mood and affect. Behavior, judgment, thought content  normal.   Assessment & Plan:  1. Moderate persistent asthma, unspecified whether complicated, stable, symptoms controlled today.  -Continue SABA as needed. Reordered Symbicort, 2 puffs, twice daily regardless of symptoms. -Patient has already received annual influenza vaccination.  2. Cough -Continue benzonatate. Educated patient that residual cough post illness, may continue for up to 4-6 week. However, if cough worsens or becomes productive, follow-up sooner.  RTC: Keep follow-up scheduled for December.   Godfrey PickKimberly S. Tiburcio PeaHarris, MSN, FNP-C [facility]  [address] [place], [place] [address] [phone]

## 2017-01-11 ENCOUNTER — Other Ambulatory Visit: Payer: Self-pay | Admitting: Family Medicine

## 2017-01-11 MED FILL — BENZONATATE 100 MG CAPSULE: 100 | 10 days supply | Qty: 60 | Fill #0

## 2017-01-25 ENCOUNTER — Other Ambulatory Visit: Payer: Self-pay | Admitting: Family Medicine

## 2017-01-29 ENCOUNTER — Other Ambulatory Visit: Payer: Self-pay | Admitting: Family Medicine

## 2017-01-29 MED FILL — LOSARTAN POTASSIUM 50 MG TA: 50 | 30 days supply | Qty: 30 | Fill #11

## 2017-01-29 MED FILL — MELOXICAM 15 MG TABLET: 15 | 30 days supply | Qty: 30 | Fill #0

## 2017-01-29 MED FILL — POTASSIUM CL ER 20 MEQ TAB: 20 | 30 days supply | Qty: 60 | Fill #10

## 2017-01-29 MED FILL — FUROSEMIDE 40 MG TAB: 40 | 30 days supply | Qty: 45 | Fill #8

## 2017-01-29 MED FILL — SYMBICORT 80-4.5 MCG INH: 80-4.5 | 30 days supply | Qty: 10 | Fill #1

## 2017-02-05 ENCOUNTER — Ambulatory Visit: Payer: Medicaid Other | Admitting: Cardiovascular Disease

## 2017-02-12 ENCOUNTER — Encounter: Payer: Self-pay | Admitting: Cardiovascular Disease

## 2017-02-12 ENCOUNTER — Ambulatory Visit: Payer: Medicaid Other | Admitting: Cardiovascular Disease

## 2017-02-12 VITALS — BP 100/78 | HR 79 | Ht 63.5 in | Wt 335.0 lb

## 2017-02-12 DIAGNOSIS — G4733 Obstructive sleep apnea (adult) (pediatric): Secondary | ICD-10-CM | POA: Insufficient documentation

## 2017-02-12 DIAGNOSIS — I10 Essential (primary) hypertension: Secondary | ICD-10-CM

## 2017-02-12 DIAGNOSIS — Z5181 Encounter for therapeutic drug level monitoring: Secondary | ICD-10-CM

## 2017-02-12 DIAGNOSIS — I5042 Chronic combined systolic (congestive) and diastolic (congestive) heart failure: Secondary | ICD-10-CM

## 2017-02-12 HISTORY — DX: Obstructive sleep apnea (adult) (pediatric): G47.33

## 2017-02-12 NOTE — Progress Notes (Addendum)
Cardiology Office Note   Date:  02/12/2017   ID:  [name], DOB May 03, 1970, MRN [id number]  PCP:  [name], FNP  Cardiologist:   Chilton Si, MD   No chief complaint on file.   History of Present Illness: Jennifer Owens is a 46 y.o. female with chronic systolic and diastolic heart failure (LVEF 40-50%), hypertensive heart disease, asthma, COPD, morbid obesity, OSA unable to afford BiPAP, and pulmonary hypertension who presents for follow up.  Jennifer Owens was admitted to the hospital 6/21 through 7/5 2017 with hypoxic respiratory failure in the setting of acute systolic and diastolic heart failure and MSSA pneumonia. She required intubation and diuresis.  She was noted to have severe pulmonary hypertension.  She had an echo 08/24/15 that revealed LVEF 35-40% with a moderately reduced right ventricular function.  She then had a left heart cath on 08/29/15 that revealed no obstructive coronary disease and her ejection fraction was 40-50% with LVEDP 28 mmHg.  Her right heart catheterization revealed severe pulmonary hypertension with a PA pressure of 76/30.  She underwent a CT scan that was negative for pulmonary embolism.  After discharge she underwent a sleep study that revealed moderate obstructive sleep apnea and BiPAP was recommended. She was unable to afford the Bipap prior to getting insurance and has been using supplemental oxygen overnight.  Since her last appointment Jennifer Owens has been doing well.  She tries to exercise by walking but hasn't been doing this as much lately because of the weather and R knee pain.  She continues to have exertional dyspnea.  Overall her edema has been well-controlled.  She has to take supplemental lasix less than once per month.  She does report orthopnea and PND.  She continues to struggle with weight gain despite moving her body more.  She tries not only to have healthy snacks and limits her portions.  She wonders what option she might have  for weight loss.  Past Medical History:  Diagnosis Date  . Asthma   . Chronic combined systolic and diastolic CHF (congestive heart failure) (HCC)    a. Echo 5/17: mild LVH, EF 55-60%, no RWMA, Gr 1 DD, mildly dilated Ao root, trivial eff //  b. Echo 6/17: mild conc LVH, EF 35-40%, RVE with mod RV dysfunction  . Hypertensive heart disease with CHF (congestive heart failure) (HCC)   . Moderate to severe pulmonary hypertension (HCC)    a RHC 7/17 with PAP 76/30  . Morbid obesity (HCC)   . NICM (nonischemic cardiomyopathy) (HCC)    a. LHC 7/17 with normal cors  . Obstructive sleep apnea 02/12/2017  . Sciatica   . Thrombocytopenia (HCC)   . Tobacco abuse     Past Surgical History:  Procedure Laterality Date  . APPENDECTOMY    . CARDIAC CATHETERIZATION N/A 08/29/2015   Procedure: Right/Left Heart Cath and Coronary Angiography;  Surgeon: [name], MD;  Location: [facility] INVASIVE CV LAB;  Service: Cardiovascular;  Laterality: N/A;  . CESAREAN SECTION    . CHOLECYSTECTOMY    . SMALL INTESTINE SURGERY       Current Outpatient Medications  Medication Sig Dispense Refill  . acetaminophen (TYLENOL) 500 MG tablet Take 1,000 mg by mouth at bedtime as needed.    Marland Kitchen albuterol (VENTOLIN HFA) 108 (90 Base) MCG/ACT inhaler Inhale 2 puffs into the lungs every 6 (six) hours as needed for wheezing or shortness of breath. 54 g 3  . benzonatate (TESSALON) 100  MG capsule TAKE 1-2 CAPSULES BY MOUTH 3 TIMES DAILY AS NEEDED FOR COUGH. 60 capsule 0  . budesonide-formoterol (SYMBICORT) 80-4.5 MCG/ACT inhaler INHALE 2 PUFFS INTO THE LUNGS 2 TIMES DAILY. 10.2 g 3  . carvedilol (COREG) 12.5 MG tablet Take 1 tablet (12.5 mg total) by mouth 2 (two) times daily with a meal. 180 tablet 3  . cetirizine (ZYRTEC) 10 MG tablet Take 1 tablet (10 mg total) by mouth daily. 30 tablet 11  . furosemide (LASIX) 40 MG tablet 1 tablet by mouth daily and as needed 135 tablet 3  . losartan (COZAAR) 50 MG tablet Take 1 tablet (50 mg  total) by mouth daily. 90 tablet 3  . meloxicam (MOBIC) 15 MG tablet TAKE 1 TABLET DAILY BY MOUTH. 30 tablet 0  . potassium chloride SA (K-DUR,KLOR-CON) 20 MEQ tablet Take 1 tablet (20 mEq total) by mouth 2 (two) times daily. 180 tablet 3   No current facility-administered medications for this visit.     Allergies:   Patient has no known allergies.    Social History:  The patient  reports that she quit smoking about 18 months ago. Her smoking use included cigarettes. She has a 2.50 pack-year smoking history. she has never used smokeless tobacco. She reports that she does not drink alcohol or use drugs.   Family History:  The patient's family history includes Diabetes Mellitus II in her father and mother; Hypertension in her mother.    ROS:  Please see the history of present illness.   Otherwise, review of systems are positive for none.   All other systems are reviewed and negative.    PHYSICAL EXAM: VS:  BP 100/78   Pulse 79   Ht 5' 3.5" (1.613 m)   Wt (!) 335 lb (152 kg)   BMI 58.41 kg/m  , BMI Body mass index is 58.41 kg/m. GENERAL:  Well appearing.  Morbid obesity HEENT: Pupils equal round and reactive, fundi not visualized, oral mucosa unremarkable NECK:  No jugular venous distention, waveform within normal limits, carotid upstroke brisk and symmetric, no bruits, no thyromegaly LYMPHATICS:  No cervical adenopathy LUNGS:  Clear to auscultation bilaterally HEART:  RRR.  PMI not displaced or sustained,S1 and S2 within normal limits, no S3, no S4, no clicks, no rubs, no murmurs.  Distant heart sounds. ABD: Central adiposity.  Positive bowel sounds normal in frequency in pitch, no bruits, no rebound, no guarding, no midline pulsatile mass, no hepatomegaly, no splenomegaly EXT:  2 plus pulses throughout, no edema, no cyanosis no clubbing SKIN:  No rashes no nodules NEURO:  Cranial nerves II through XII grossly intact, motor grossly intact throughout PSYCH:  Cognitively intact,  oriented to person place and time   EKG:  EKG is ordered today. 04/25/16: Sinus rhythm. Rate 75 bpm. Nonspecific T wave abnormalities. QTc 460 ms. 02/12/17: Sinus rhythm.  Rate 79 bpm.  L axis deviation.    LHC 08/29/15:  Normal coronary arteries LVF 40-50%.  LVEDP 28 mmHg  RHC 08/29/15:  RA 16, RV 64/15, PA 67/19, mean PA 43, PCWP 21  Echo 08/24/15: Study Conclusions  - Left ventricle: The cavity size was moderately dilated. There was   mild concentric hypertrophy. Systolic function was normal. Wall   motion was normal; there were no regional wall motion   abnormalities. The study is not technically sufficient to allow   evaluation of LV diastolic function. - Right ventricle: The cavity size was moderately dilated. Wall   thickness was normal. Systolic  function was moderately reduced. - Pulmonic valve: Not visualized. - Pulmonary arteries: Systolic pressure couldn&'t be assessed. - Inferior vena cava: Not visualized. - Pericardium, extracardiac: There was no pericardial effusion.  Impressions:  - This is a very limited quality study.  Recent Labs: 06/21/2016: ALT 10; Hemoglobin 13.5; Platelets 75; TSH 1.78 08/15/2016: BUN 16; Creatinine, Ser 1.04; Magnesium 2.1; Potassium 4.7; Sodium 143    Lipid Panel    Component Value Date/Time   CHOL 150 06/21/2016 1018   TRIG 148 06/21/2016 1018   HDL 31 (L) 06/21/2016 1018   CHOLHDL 4.8 06/21/2016 1018   VLDL 30 06/21/2016 1018   LDLCALC 89 06/21/2016 1018      Wt Readings from Last 3 Encounters:  02/12/17 (!) 335 lb (152 kg)  01/01/17 (!) 327 lb (148.3 kg)  12/17/16 (!) 324 lb (147 kg)      ASSESSMENT AND PLAN:  # Severe pulmonary hypertension: # Chronic systolic and diastolic heart failure: # Hypertensive heart disease: Ms. Hasselman is euvolemic today and doing well.  Her weight is going up.  It seems to be more attributable to weight gain than volume overload, as she seems to be euvolemic.  Continue losartan and  carvedilol.  She can continue to take extra furosemide as needed.  # Hypertension: Stable.  Continue losartan and carvedilol.  We will check a basic metabolic panel today.  # OSA: She now has insurance and can get a BiPAP machine.  We will contact Dr. Mayford Knife.    # Morbid obesity: Ms. Mara continues to struggle with weight gain.  She would like to speak with an obesity medicine specialist.  I will refer her to Dr. Dalbert Garnet.   Current medicines are reviewed at length with the patient today.  The patient does not have concerns regarding medicines.  The following changes have been made:  None Labs/ tests ordered today include:   Orders Placed This Encounter  Procedures  . Comprehensive metabolic panel  . Ambulatory referral to [facility]  . EKG 12-Lead     Disposition:   FU with Oreatha Fabry C. Duke Salvia, MD, [facility] in 6 months.    This note was written with the assistance of speech recognition software.  Please excuse any transcriptional errors.  Signed, Keeara Frees C. Duke Salvia, MD, [facility]  02/12/2017 2:55 PM    [facility]

## 2017-02-12 NOTE — Patient Instructions (Signed)
Medication Instructions:  Your physician recommends that you continue on your current medications as directed. Please refer to the Current Medication list given to you today.  Labwork: CMET TODAY   Testing/Procedures: NONE  Follow-Up: Your physician wants you to follow-up in: 6 MONTH OV You will receive a reminder letter in the mail two months in advance. If you don't receive a letter, please call our office to schedule the follow-up appointment.  You have been referred to DR CAREN BEASLEY  IF YOU DO NOT HEAR FROM THE OFFICE GIVE [place] A CALL AT [phone]  Any Other Special Instructions Will Be Listed Below (If Applicable). DR TURNER'S OFFICE WILL BE IN TOUCH REGARDING YOUR CPAP MACHINE  If you need a refill on your cardiac medications before your next appointment, please call your pharmacy.

## 2017-02-13 LAB — COMPREHENSIVE METABOLIC PANEL
ALT: 16 IU/L (ref 0–32)
AST: 18 IU/L (ref 0–40)
Albumin/Globulin Ratio: 1.1 — ABNORMAL LOW (ref 1.2–2.2)
Albumin: 4 g/dL (ref 3.5–5.5)
Alkaline Phosphatase: 69 IU/L (ref 39–117)
BILIRUBIN TOTAL: 0.4 mg/dL (ref 0.0–1.2)
BUN/Creatinine Ratio: 19 (ref 9–23)
BUN: 18 mg/dL (ref 6–24)
CHLORIDE: 99 mmol/L (ref 96–106)
CO2: 30 mmol/L — AB (ref 20–29)
Calcium: 9.1 mg/dL (ref 8.7–10.2)
Creatinine, Ser: 0.93 mg/dL (ref 0.57–1.00)
GFR calc Af Amer: 85 mL/min/{1.73_m2} (ref >59)
GFR calc non Af Amer: 73 mL/min/{1.73_m2} (ref >59)
GLUCOSE: 80 mg/dL (ref 65–99)
Globulin, Total: 3.7 g/dL (ref 1.5–4.5)
Potassium: 4.7 mmol/L (ref 3.5–5.2)
SODIUM: 142 mmol/L (ref 134–144)
Total Protein: 7.7 g/dL (ref 6.0–8.5)

## 2017-02-15 ENCOUNTER — Other Ambulatory Visit: Payer: Self-pay | Admitting: Family Medicine

## 2017-02-15 MED FILL — PROAIR HFA 90 MCG INHALER: 108 (90 BAS | 25 days supply | Qty: 9 | Fill #0

## 2017-02-21 ENCOUNTER — Ambulatory Visit: Payer: Self-pay | Admitting: Family Medicine

## 2017-02-22 ENCOUNTER — Ambulatory Visit (HOSPITAL_COMMUNITY)
Admission: RE | Admit: 2017-02-22 | Discharge: 2017-02-22 | Disposition: A | Discharge status: Home / Self Care | Payer: Medicaid Other | Source: Ambulatory Visit | Attending: Family Medicine | Admitting: Family Medicine

## 2017-02-22 ENCOUNTER — Ambulatory Visit (INDEPENDENT_AMBULATORY_CARE_PROVIDER_SITE_OTHER): Payer: Medicaid Other | Admitting: Family Medicine

## 2017-02-22 ENCOUNTER — Encounter: Payer: Self-pay | Admitting: Family Medicine

## 2017-02-22 VITALS — BP 140/80 | HR 85 | Temp 98.1°F | Resp 14 | Ht 63.5 in | Wt 341.0 lb

## 2017-02-22 DIAGNOSIS — J4541 Moderate persistent asthma with (acute) exacerbation: Secondary | ICD-10-CM

## 2017-02-22 DIAGNOSIS — R5383 Other fatigue: Secondary | ICD-10-CM | POA: Insufficient documentation

## 2017-02-22 DIAGNOSIS — I517 Cardiomegaly: Secondary | ICD-10-CM | POA: Diagnosis not present

## 2017-02-22 DIAGNOSIS — G4733 Obstructive sleep apnea (adult) (pediatric): Secondary | ICD-10-CM | POA: Diagnosis not present

## 2017-02-22 DIAGNOSIS — I509 Heart failure, unspecified: Secondary | ICD-10-CM

## 2017-02-22 DIAGNOSIS — R609 Edema, unspecified: Secondary | ICD-10-CM | POA: Insufficient documentation

## 2017-02-22 DIAGNOSIS — Z9981 Dependence on supplemental oxygen: Secondary | ICD-10-CM

## 2017-02-22 MED ORDER — BUDESONIDE 0.25 MG/2ML IN SUSP: 0.2500 mg | Freq: Two times a day (BID) | RESPIRATORY_TRACT | 12 refills | Status: AC

## 2017-02-22 MED ORDER — ALBUTEROL SULFATE (2.5 MG/3ML) 0.083% IN NEBU: 2.5000 mg | INHALATION_SOLUTION | Freq: Four times a day (QID) | RESPIRATORY_TRACT | 1 refills | Status: DC | PRN

## 2017-02-22 MED ORDER — FLUTICASONE PROPIONATE 50 MCG/ACT NA SUSP: 2.0000 | Freq: Every day | NASAL | 6 refills | Status: DC

## 2017-02-22 MED FILL — ALBUTEROL 0.083% INHAL SOLN: (2.5 MG/3ML | 7 days supply | Qty: 90 | Fill #0

## 2017-02-22 MED FILL — FLUTICASONE PROP 50 MCG SPR: 50 | 30 days supply | Qty: 16 | Fill #0

## 2017-02-22 NOTE — Progress Notes (Signed)
Patient ID: Jennifer Owens, female    DOB: 11/16/1970, 46 y.o.   MRN: [id number]  PCP: Jennifer Owens  Chief Complaint  Patient presents with  . Follow-up    6 MONTH-chronic conditions   . Fatigue  . Shortness of Breath    Subjective:  HPI Jennifer Owens is a 46 y.o. female with pulmonary hypertension, CHF, Asthma, oxygen dependency, OSA (w/o BIPAP), presents for six month follow-up of chronic conditions. Jennifer Owens has been uninsured for along period of time though reports today she recently received Medicaid. Jennifer Owens is followed cardiologist Dr. Chilton Siiffany [place], MD and was last seen 02/12/2017 with recommendation / referral to weight management and recommended obtaining BIPAP. Last echocardiogram 08/24/2015  LVEF 35-40% and right ventricle w/ dilated moderate systolic dysfunction. No recent studies performed. [name]'s last sleep study was performed 12/18/2015 which BIPAP was recommended to treat OSA and nocturnal hypoxemia. Due to financial constraints, Jennifer Owens was unable to obtain BiPAP. At present, she is on continuous home oxygen. Prescribed 2 liter continuous oxygen via nasal cannula. Jennifer Owens reports over the last 2 weeks, she has increased her home oxygen to 3 liters due to worsening SOB at rest and with exertion.  She suffers from asthma and reports associated wheezing daily. She uses albuterol at least once to twice daily and reports compliance with Symbicort-maintenance inhaler. Denies URI symptoms, chest tightness, lower extremity edema, productive coughing, or fever. Reports compliance with all medications. No home monitoring of blood pressure. She has continued to gain weight and has not attempted any lifestyle efforts to loose weight or physical activity. Social History   Socioeconomic History  . Marital status: Legally Separated    Spouse name: Not on file  . Number of children: Not on file  . Years of education: Not on file  . Highest education level: Not on file  Social Needs  .  Financial resource strain: Not on file  . Food insecurity - worry: Not on file  . Food insecurity - inability: Not on file  . Transportation needs - medical: Not on file  . Transportation needs - non-medical: Not on file  Occupational History  . Occupation: CNA for [employer]  Tobacco Use  . Smoking status: Former Smoker    Packs/day: 0.25    Years: 10.00    Pack years: 2.50    Types: Cigarettes    Last attempt to quit: 07/2015    Years since quitting: 1.5  . Smokeless tobacco: Never Used  Substance and Sexual Activity  . Alcohol use: No    Alcohol/week: 0.6 oz    Types: 1 Standard drinks or equivalent per week    Comment: very rarely - 1-2 drinks/month  . Drug use: No  . Sexual activity: Not on file  Other Topics Concern  . Not on file  Social History Narrative  . Not on file    Family History  Problem Relation Age of Onset  . Diabetes Mellitus II Mother   . Hypertension Mother   . Diabetes Mellitus II Father    Review of Systems  Constitutional: Positive for fatigue. Negative for fever and unexpected weight change.  HENT: Negative.   Respiratory: Positive for shortness of breath and wheezing.   Cardiovascular: Negative.   Endocrine: Negative.   Neurological: Negative.   Psychiatric/Behavioral: Negative.     Patient Active Problem List   Diagnosis Date Noted  . Obstructive sleep apnea 02/12/2017  . NICM (nonischemic cardiomyopathy) (HCC) 09/06/2015  . Pulmonary  hypertension (HCC)   . Chronic combined systolic and diastolic heart failure (HCC)   . Morbid obesity (HCC)   . Hypertensive heart disease 08/17/2015  . Hypoxia 07/22/2015  . Asthma 07/22/2015  . Thrombocytopenia (HCC) 07/22/2015  . Tobacco abuse 07/22/2015    No Known Allergies  Prior to Admission medications   Medication Sig Start Date End Date Taking? Authorizing Provider  acetaminophen (TYLENOL) 500 MG tablet Take 1,000 mg by mouth at bedtime as needed.   Yes [provider]  albuterol (VENTOLIN HFA) 108 (90 Base) MCG/ACT inhaler Inhale 2 puffs into the lungs every 6 (six) hours as needed for wheezing or shortness of breath. 10/26/16  Yes Jegede, Olugbemiga E, MD  benzonatate (TESSALON) 100 MG capsule TAKE 1-2 CAPSULES BY MOUTH 3 TIMES DAILY AS NEEDED FOR COUGH. 01/11/17  Yes Jennifer Neighbors, Owens  budesonide-formoterol (SYMBICORT) 80-4.5 MCG/ACT inhaler INHALE 2 PUFFS INTO THE LUNGS 2 TIMES DAILY. 01/01/17  Yes Jennifer Neighbors, Owens  carvedilol (COREG) 12.5 MG tablet Take 1 tablet (12.5 mg total) by mouth 2 (two) times daily with a meal. 03/01/16  Yes Chilton Si, MD  cetirizine (ZYRTEC) 10 MG tablet Take 1 tablet (10 mg total) by mouth daily. 06/21/16  Yes Jennifer Neighbors, Owens  furosemide (LASIX) 40 MG tablet 1 tablet by mouth daily and as needed 03/01/16  Yes Chilton Si, MD  losartan (COZAAR) 50 MG tablet Take 1 tablet (50 mg total) by mouth daily. 03/01/16  Yes Chilton Si, MD  meloxicam (MOBIC) 15 MG tablet TAKE 1 TABLET DAILY BY MOUTH. 01/25/17  Yes Jennifer Neighbors, Owens  potassium chloride SA (K-DUR,KLOR-CON) 20 MEQ tablet Take 1 tablet (20 mEq total) by mouth 2 (two) times daily. 03/01/16  Yes Chilton Si, MD  VENTOLIN HFA 108 [phone] Base) MCG/ACT inhaler INHALE 2 PUFFS INTO THE LUNGS EVERY 6 (SIX) HOURS AS NEEDED FOR WHEEZING OR SHORTNESS OF BREATH. 02/15/17  Yes Jennifer Neighbors, Owens    Past Medical, Surgical Family and Social History reviewed and updated.    Objective:   Today's Vitals   02/22/17 1401  BP: 140/80  Pulse: 85  Resp: 14  Temp: 98.1 F (36.7 C)  TempSrc: Oral  Weight: (!) 341 lb (154.7 kg)  Height: 5' 3.5" (1.613 m)    Wt Readings from Last 3 Encounters:  02/22/17 (!) 341 lb (154.7 kg)  02/12/17 (!) 335 lb (152 kg)  01/01/17 (!) 327 lb (148.3 kg)    Physical Exam  Constitutional: She is oriented to person, place, and time. She appears well-developed and well-nourished.  Doesn't have oxygen  on during visit today.   HENT:  Head: Normocephalic and atraumatic.  Eyes: Conjunctivae and EOM are normal. Pupils are equal, round, and reactive to light.  Neck: Normal range of motion. Neck supple. No thyromegaly present.  Cardiovascular: Normal rate, regular rhythm, normal heart sounds and intact distal pulses.  Pulmonary/Chest: Effort normal. No respiratory distress. She has decreased breath sounds. She has no wheezes. She has no rhonchi. She has no rales.  Musculoskeletal: Normal range of motion.  Lymphadenopathy:    She has no cervical adenopathy.  Neurological: She is alert and oriented to person, place, and time.  Skin: Skin is warm and dry.  Psychiatric: She has a normal mood and affect. Her behavior is normal. Judgment and thought content normal.   Assessment & Plan:  1. OSA (obstructive sleep apnea), prior sleep study occurred more than 12 months ago. Ordering a new split-sleep  study. Untreated OSA is likely the reason for persistent fatigue.   2. Moderate persistent asthma with acute exacerbation, uncontrolled. Adding nebulizer treatments every 6 hours as needed. Continue Symbicort daily regardless of symptoms and albuterol inhaler every 4-6 hours as needed for short of breath and wheezing. Complaining of worsening SOB, will obtain a CXR.  3. On home oxygen therapy, continue 2-3 liters to facilitate improved ventilation. Adding humidification to reduce nasal mucosal drying.   4. Fatigue, unspecified type, likely resulting from persistent untreated OSA. Checking thyroid panel.   5. Chronic heart failure, unspecified heart failure type [facility]), followed by [facility], Dr. Chilton Si.  5. Pulmonary hypertension, continue supplemental oxygen and follow-up at [facility]. Will obtain a chest x-ray to rule out acute cause for worsening dyspnea.   Meds ordered this encounter  Medications  . budesonide (PULMICORT) 0.25 MG/2ML nebulizer solution    Sig:  Take 2 mLs (0.25 mg total) by nebulization 2 (two) times daily.    Dispense:  60 mL    Refill:  12    Order Specific Question:   Supervising Provider    Answer:   Quentin Angst [id number]  . albuterol (PROVENTIL) (2.5 MG/3ML) 0.083% nebulizer solution    Sig: Take 3 mLs (2.5 mg total) by nebulization every 6 (six) hours as needed for wheezing or shortness of breath.    Dispense:  150 mL    Refill:  1    Order Specific Question:   Supervising Provider    Answer:   Quentin Angst [id number]  . fluticasone (FLONASE) 50 MCG/ACT nasal spray    Sig: Place 2 sprays into both nostrils daily.    Dispense:  16 g    Refill:  6    Order Specific Question:   Supervising Provider    Answer:   Quentin Angst [[id number]]   Orders Placed This Encounter  Procedures  . For home use only DME Pulse oximeter  . DME Nebulizer machine  . DME Other see comment  . DG Chest 2 View  . CBC with Differential  . Thyroid Panel With TSH  . Split night study    RTC: 6 weeks to re-evaluate chronic conditions.   [name]. Tiburcio Pea, MSN, Owens-C [facility]  [address] [place], [place] [address] [phone]

## 2017-02-22 NOTE — Patient Instructions (Addendum)
-  Obtain chest x-ray once you leave the office today.  -I am faxing over an order for nebulizer machine for you to perform home nebulizer treatments.  -I am placing an order albuterol solution and budesonide which you will perform every 6 hours as needed for moderate to severe shortness of breath and wheezing.    -for nasal congestion, I will trial flonase nasal spray, 2 spray each nares once daily.

## 2017-02-23 ENCOUNTER — Telehealth: Payer: Self-pay | Admitting: Family Medicine

## 2017-02-23 LAB — THYROID PANEL WITH TSH
Free Thyroxine Index: 1.6 (ref 1.2–4.9)
T3 Uptake Ratio: 27 % (ref 24–39)
T4 TOTAL: 5.8 ug/dL (ref 4.5–12.0)
TSH: 1.13 u[IU]/mL (ref 0.450–4.500)

## 2017-02-23 LAB — CBC WITH DIFFERENTIAL/PLATELET
BASOS ABS: 0 10*3/uL (ref 0.0–0.2)
Basos: 1 %
EOS (ABSOLUTE): 0.3 10*3/uL (ref 0.0–0.4)
Eos: 5 %
Hematocrit: 38.9 % (ref 34.0–46.6)
Hemoglobin: 12.5 g/dL (ref 11.1–15.9)
IMMATURE GRANULOCYTES: 0 %
Immature Grans (Abs): 0 10*3/uL (ref 0.0–0.1)
LYMPHS ABS: 1.9 10*3/uL (ref 0.7–3.1)
Lymphs: 29 %
MCH: 31.3 pg (ref 26.6–33.0)
MCHC: 32.1 g/dL (ref 31.5–35.7)
MCV: 97 fL (ref 79–97)
MONOS ABS: 0.6 10*3/uL (ref 0.1–0.9)
Monocytes: 9 %
NEUTROS PCT: 56 %
Neutrophils Absolute: 3.7 10*3/uL (ref 1.4–7.0)
PLATELETS: 59 10*3/uL — AB (ref 150–379)
RBC: 4 x10E6/uL (ref 3.77–5.28)
RDW: 14.1 % (ref 12.3–15.4)
WBC: 6.5 10*3/uL (ref 3.4–10.8)

## 2017-02-23 MED ORDER — FUROSEMIDE 40 MG PO TABS: 40.0000 mg | ORAL_TABLET | Freq: Two times a day (BID) | ORAL | 3 refills | Status: DC

## 2017-02-23 MED ORDER — POTASSIUM CHLORIDE CRYS ER 20 MEQ PO TBCR: 20.0000 meq | EXTENDED_RELEASE_TABLET | Freq: Two times a day (BID) | ORAL | 3 refills | Status: AC

## 2017-02-23 NOTE — Telephone Encounter (Signed)
Left voicemail for patient at the phone number provided on EMR advising that she had an abnormal chest x-ray.  For now I am going to resume Lasix 40 mg twice daily, along with potassium 20 mEq with each dose of Lasix.  Advised patient if she sustained any worsening shortness of breath, cough or just feeling worse she should go immediately to the ED.  Also advised of abnormal platelet count which is 59.  Patient has a has chronic thrombocytopenia of unknown etiology.  However advised that she experienced any bruising or bleeding she should go immediately to the ED.  Advised to come into the office on Wednesday, 02/27/2017 I will work her in for evaluation however until that time she should follow instructions as provided.  Advised her to page the on-call provider to advise that she has received this message by calling our office number at [phone].   [name]. Tiburcio Pea, MSN, FNP-C [facility]  [address] [place], [place] [address] [phone]

## 2017-02-24 ENCOUNTER — Telehealth: Payer: Self-pay | Admitting: Family Medicine

## 2017-02-24 NOTE — Telephone Encounter (Signed)
Attempted to call patient again in reference to prior note regarding abnormal chest x-ray and resuming furosemide/potassium. Left another voicemail for patient to phone answering service to verify receipt of original message.  [name]. Tiburcio Pea, MSN, FNP-C [facility]  [address] [place], [place] [address] [phone]

## 2017-02-25 MED FILL — FUROSEMIDE 40 MG TAB: 40 | 30 days supply | Qty: 45 | Fill #9

## 2017-02-27 ENCOUNTER — Encounter: Payer: Self-pay | Admitting: Family Medicine

## 2017-02-27 ENCOUNTER — Ambulatory Visit: Payer: Medicaid Other | Admitting: Family Medicine

## 2017-02-27 ENCOUNTER — Telehealth: Payer: Self-pay | Admitting: Hematology and Oncology

## 2017-02-27 VITALS — BP 134/86 | HR 82 | Temp 98.6°F | Resp 12 | Ht 63.5 in | Wt 337.0 lb

## 2017-02-27 DIAGNOSIS — R0609 Other forms of dyspnea: Secondary | ICD-10-CM

## 2017-02-27 DIAGNOSIS — D696 Thrombocytopenia, unspecified: Secondary | ICD-10-CM

## 2017-02-27 DIAGNOSIS — J810 Acute pulmonary edema: Secondary | ICD-10-CM | POA: Diagnosis not present

## 2017-02-27 MED ORDER — ALBUTEROL SULFATE (2.5 MG/3ML) 0.083% IN NEBU
2.5000 mg | INHALATION_SOLUTION | Freq: Once | RESPIRATORY_TRACT | Status: AC
  Administered 2017-02-27: 2.5 mg via RESPIRATORY_TRACT

## 2017-02-27 MED ORDER — IPRATROPIUM BROMIDE 0.02 % IN SOLN
0.5000 mg | Freq: Once | RESPIRATORY_TRACT | Status: AC
  Administered 2017-02-27: 0.5 mg via RESPIRATORY_TRACT

## 2017-02-27 NOTE — Telephone Encounter (Signed)
Called patient and left message om voice mail regarding appointment D/T/Loc/PH#

## 2017-02-27 NOTE — Progress Notes (Signed)
Patient ID: Jennifer Owens, female    DOB: Mar 03, 1970, 47 y.o.   MRN: [id number]  PCP: [name], FNP  Chief Complaint  Patient presents with  . Follow-up    xray results    Subjective:  HPI Jennifer Owens is a 47 y.o. female with CHF, Asthma, Pulmonary HTN, OSA, presents for evaluation of abnormal chest x-ray.   [name] was seen in office 02/22/2017 and was evaluated for worsening shortness of breath, wheezing, and increased oxygen demand. Chest x-ray was ordered and was significant for "cardiac enlargement with vascular congestion and mild edema". She was placed on Furosemide 80 mg daily and advised to return to office today for follow-up. Today, she reports remaining on 3 liters of oxygen, although notes mild improvement in SOB. Since resuming Lasix, her weight has decreased 3 lbs. Continues to have intermittent wheezing. Reports mild edema in lower extremities. She suffers from OSA and was recently referred for split sleep study and pending appointment.  She denies cough, headache, sputum production, fever, or chest pain.  [name] has experienced thrombocytopenia for an extended period of time. Denies any prior work-up or evaluation for low platelets. Has a history of extended heparin therapy during a hospitalization in 2017, since that time platelets have progressively decreased below 100. Most recent labs reveal a platelet count of 59. [name] complains of occasional bruising and notes prolonged bleeding when blood is collected.  Denies visible hematuria, dark stools, or hematemesis. Social History   Socioeconomic History  . Marital status: Legally Separated    Spouse name: Not on file  . Number of children: Not on file  . Years of education: Not on file  . Highest education level: Not on file  Social Needs  . Financial resource strain: Not on file  . Food insecurity - worry: Not on file  . Food insecurity - inability: Not on file  . Transportation needs - medical: Not on file  .  Transportation needs - non-medical: Not on file  Occupational History  . Occupation: CNA for [employer]  Tobacco Use  . Smoking status: Former Smoker    Packs/day: 0.25    Years: 10.00    Pack years: 2.50    Types: Cigarettes    Last attempt to quit: 07/2015    Years since quitting: 1.5  . Smokeless tobacco: Never Used  Substance and Sexual Activity  . Alcohol use: No    Alcohol/week: 0.6 oz    Types: 1 Standard drinks or equivalent per week    Comment: very rarely - 1-2 drinks/month  . Drug use: No  . Sexual activity: Not on file  Other Topics Concern  . Not on file  Social History Narrative  . Not on file    Family History  Problem Relation Age of Onset  . Diabetes Mellitus II Mother   . Hypertension Mother   . Diabetes Mellitus II Father    Review of Systems  HENT: Positive for congestion.   Eyes: Negative.   Respiratory: Positive for shortness of breath and wheezing.   Cardiovascular: Negative.   Neurological: Negative.   Psychiatric/Behavioral: Negative.     Patient Active Problem List   Diagnosis Date Noted  . Obstructive sleep apnea 02/12/2017  . NICM (nonischemic cardiomyopathy) (HCC) 09/06/2015  . Pulmonary hypertension (HCC)   . Chronic combined systolic and diastolic heart failure (HCC)   . Morbid obesity (HCC)   . Hypertensive heart disease 08/17/2015  . Hypoxia 07/22/2015  .  Asthma 07/22/2015  . Thrombocytopenia (HCC) 07/22/2015  . Tobacco abuse 07/22/2015   No Known Allergies  Prior to Admission medications   Medication Sig Start Date End Date Taking? Authorizing Provider  acetaminophen (TYLENOL) 500 MG tablet Take 1,000 mg by mouth at bedtime as needed.   Yes [provider]  albuterol (PROVENTIL) (2.5 MG/3ML) 0.083% nebulizer solution Take 3 mLs (2.5 mg total) by nebulization every 6 (six) hours as needed for wheezing or shortness of breath. 02/22/17  Yes [name], FNP  albuterol (VENTOLIN HFA) 108 (90 Base)  MCG/ACT inhaler Inhale 2 puffs into the lungs every 6 (six) hours as needed for wheezing or shortness of breath. 10/26/16  Yes Jegede, Olugbemiga E, MD  benzonatate (TESSALON) 100 MG capsule TAKE 1-2 CAPSULES BY MOUTH 3 TIMES DAILY AS NEEDED FOR COUGH. 01/11/17  Yes [name], FNP  budesonide (PULMICORT) 0.25 MG/2ML nebulizer solution Take 2 mLs (0.25 mg total) by nebulization 2 (two) times daily. 02/22/17  Yes [name], FNP  budesonide-formoterol (SYMBICORT) 80-4.5 MCG/ACT inhaler INHALE 2 PUFFS INTO THE LUNGS 2 TIMES DAILY. 01/01/17  Yes [name], FNP  carvedilol (COREG) 12.5 MG tablet Take 1 tablet (12.5 mg total) by mouth 2 (two) times daily with a meal. 03/01/16  Yes Chilton Si, MD  cetirizine (ZYRTEC) 10 MG tablet Take 1 tablet (10 mg total) by mouth daily. 06/21/16  Yes [name], FNP  fluticasone (FLONASE) 50 MCG/ACT nasal spray Place 2 sprays into both nostrils daily. 02/22/17  Yes [name], FNP  furosemide (LASIX) 40 MG tablet Take 1 tablet (40 mg total) by mouth 2 (two) times daily. 1 tablet by mouth daily and as needed 02/23/17  Yes [name], FNP  losartan (COZAAR) 50 MG tablet Take 1 tablet (50 mg total) by mouth daily. 03/01/16  Yes Chilton Si, MD  meloxicam (MOBIC) 15 MG tablet TAKE 1 TABLET DAILY BY MOUTH. 01/25/17  Yes [name], FNP  potassium chloride SA (K-DUR,KLOR-CON) 20 MEQ tablet Take 1 tablet (20 mEq total) by mouth 2 (two) times daily. Take with each dose of lasix. 02/23/17  Yes [name], FNP  VENTOLIN HFA 108 (90 Base) MCG/ACT inhaler INHALE 2 PUFFS INTO THE LUNGS EVERY 6 (SIX) HOURS AS NEEDED FOR WHEEZING OR SHORTNESS OF BREATH. Patient not taking: Reported on 02/27/2017 02/15/17   [name], FNP    Past Medical, Surgical Family and Social History reviewed and updated.   Objective:   Today's Vitals   02/27/17 0852  BP: 134/86  Pulse: 82  Resp: 12  Temp: 98.6 F (37 C)   TempSrc: Oral  SpO2: 96%  Weight: (!) 337 lb (152.9 kg)  Height: 5' 3.5" (1.613 m)    Wt Readings from Last 3 Encounters:  02/27/17 (!) 337 lb (152.9 kg)  02/22/17 (!) 341 lb (154.7 kg)  02/12/17 (!) 335 lb (152 kg)    Physical Exam and well-nourished.  Doesn't have oxygen on during visit today.   HENT:  Head: Normocephalic and atraumatic.  Eyes: Conjunctivae and EOM are normal. Pupils are equal, round, and reactive to light.  Neck: Normal range of motion. Neck supple. No thyromegaly present.  Cardiovascular: Normal rate, regular rhythm, normal heart sounds and intact distal pulses.  Pulmonary/Chest: Effort normal. No respiratory distress. She has decreased breath sounds. She has no wheezes. She has no rhonchi. She has no rales. "subjective wheezing reported, although non noted on ausculation". Musculoskeletal: Normal range of motion.  Lymphadenopathy:   She has no cervical adenopathy.  Neurological: She is alert and oriented to person, place, and time.  Skin: Skin is warm and dry.  Psychiatric: She has a normal mood and affect. Her behavior is normal. Judgment and thought content normal.  Assessment & Plan:  1. Acute pulmonary edema (HCC), mild edema noted on imaging. Patient appears without distress today and is presently not displaying any symptoms of air hunger or respiratory distress. Will continue to closely manage patient with diuretic therapy, home oxygen, and routine home nebulizer treatments. Obtaining a BNP to ensure no underlying CHF exacerbation is contributing to symptoms. Continue SABA and LABA. Repeat CXR in 7 days.   2. Thrombocytopenia (HCC),  idiopathic, history of IV heparin greater than 1 year ago. No prior hematology work-up. Will refer to hematology for second opinion and evaluation .- Ambulatory referral to Hematology  3. Other form of dyspnea, obtaining BNP to rule out heart failure contributing to current symptoms. Administering a Duo-nebulizer treatment  in office to improve air movement.  Meds ordered this encounter  Medications  . ipratropium (ATROVENT) nebulizer solution 0.5 mg  . albuterol (PROVENTIL) (2.5 MG/3ML) 0.083% nebulizer solution 2.5 mg   Orders Placed This Encounter  Procedures  . Brain natriuretic peptide  . Ambulatory referral to Hematology   RTC: 1 week repeat CXR and evaluation dyspnea.   [name]. Tiburcio Pea, MSN, FNP-C [facility]  [address] [place], [place] [address] [phone]

## 2017-02-28 LAB — BRAIN NATRIURETIC PEPTIDE: BNP: 17.6 pg/mL (ref 0.0–100.0)

## 2017-03-01 ENCOUNTER — Other Ambulatory Visit: Payer: Self-pay | Admitting: Family Medicine

## 2017-03-01 MED FILL — BENZONATATE 100 MG CAPSULE: 100 | 10 days supply | Qty: 60 | Fill #0

## 2017-03-04 ENCOUNTER — Other Ambulatory Visit: Payer: Self-pay | Admitting: Cardiovascular Disease

## 2017-03-04 DIAGNOSIS — I5042 Chronic combined systolic (congestive) and diastolic (congestive) heart failure: Secondary | ICD-10-CM

## 2017-03-04 NOTE — Telephone Encounter (Signed)
Please review for refill. Thanks!  

## 2017-03-04 NOTE — Telephone Encounter (Signed)
Patient notified not to take the Meloxicam and to take Tylenol.

## 2017-03-04 NOTE — Telephone Encounter (Signed)
Follow up   Patient calling to request refill   *STAT* If patient is at the pharmacy, call can be transferred to refill team.   1. Which medications need to be refilled? (please list name of each medication and dose if known) losartan (COZAAR) 50 MG tablet and potassium chloride SA (K-DUR,KLOR-CON) 20 MEQ tablet  2. Which pharmacy/location (including street and city if local pharmacy) is medication to be sent to?[facility]   3. Do they need a 30 day or 90 day supply?90

## 2017-03-05 ENCOUNTER — Ambulatory Visit (HOSPITAL_COMMUNITY)
Admission: RE | Admit: 2017-03-05 | Discharge: 2017-03-05 | Disposition: A | Discharge status: Home / Self Care | Payer: Medicaid Other | Source: Ambulatory Visit | Attending: Family Medicine | Admitting: Family Medicine

## 2017-03-05 ENCOUNTER — Telehealth: Payer: Self-pay | Admitting: Family Medicine

## 2017-03-05 ENCOUNTER — Ambulatory Visit (INDEPENDENT_AMBULATORY_CARE_PROVIDER_SITE_OTHER): Payer: Medicaid Other | Admitting: Family Medicine

## 2017-03-05 ENCOUNTER — Encounter: Payer: Self-pay | Admitting: Family Medicine

## 2017-03-05 VITALS — BP 130/74 | HR 80 | Temp 98.2°F | Resp 12 | Ht 63.5 in | Wt 333.0 lb

## 2017-03-05 DIAGNOSIS — J810 Acute pulmonary edema: Secondary | ICD-10-CM | POA: Insufficient documentation

## 2017-03-05 DIAGNOSIS — G4733 Obstructive sleep apnea (adult) (pediatric): Secondary | ICD-10-CM | POA: Diagnosis not present

## 2017-03-05 DIAGNOSIS — D696 Thrombocytopenia, unspecified: Secondary | ICD-10-CM

## 2017-03-05 DIAGNOSIS — I517 Cardiomegaly: Secondary | ICD-10-CM | POA: Diagnosis not present

## 2017-03-05 DIAGNOSIS — R9389 Abnormal findings on diagnostic imaging of other specified body structures: Secondary | ICD-10-CM | POA: Diagnosis not present

## 2017-03-05 DIAGNOSIS — R0602 Shortness of breath: Secondary | ICD-10-CM

## 2017-03-05 MED ORDER — FUROSEMIDE 40 MG PO TABS: 40.0000 mg | ORAL_TABLET | Freq: Every day | ORAL | 3 refills | Status: AC

## 2017-03-05 MED FILL — POTASSIUM CL ER 20 MEQ TAB: 20 | 30 days supply | Qty: 60 | Fill #0

## 2017-03-05 MED FILL — LOSARTAN POTASSIUM 50 MG TA: 50 | 30 days supply | Qty: 30 | Fill #0

## 2017-03-05 NOTE — Telephone Encounter (Signed)
Please contact [facility] in [place], supply company-regarding DME orders placed on 02/22/2017 for pulse oximetry, nebulizer machine, and humidification oxygen attachment.  Patient was told previously order was pending. Yesterday she arrived to pick-up items, and was informed orders were not received. Please contact [facility] and if needed resend orders from orders tab.

## 2017-03-05 NOTE — Telephone Encounter (Signed)
I called [facility] and spoke with [place]. She states they do have the orders and they are being mailed to patient via [employer]ps. [place] states according to their records patient called yesterday and said she had not received supplies yet and needed them asap, they offered her to come into the retail location and pick them up if she couldn't wait on the shipment. According to [place] patient never came into retail location to pick up supplies. [place] also says the reperesentative  that Ms. Jennifer Owens with yesterday offered her the tracking number for the package and patient refused to take it.   I spoke with patient and explained this to her. She says she went to the store and the didn't have the order. Also that she is not going back to the store. I advised her to call back and speak with [place] and get the tracking number to find when it should be delivered to her house since she refuses to go back to the retail location. Thanks!

## 2017-03-05 NOTE — Telephone Encounter (Signed)
Advise patient that recent chest x-ray shows improvement compared to prior image. She should continue 40 mg furosemide once daily as prescribed.  Jennifer Owens. Tiburcio Pea, MSN, FNP-C [facility]  [address] [place], [place] [address] [phone]

## 2017-03-05 NOTE — Telephone Encounter (Signed)
Left a vm for patient to callback 

## 2017-03-05 NOTE — Telephone Encounter (Signed)
Patient notified of results.

## 2017-03-06 ENCOUNTER — Ambulatory Visit: Payer: Medicaid Other | Admitting: Family Medicine

## 2017-03-10 NOTE — Progress Notes (Signed)
Patient ID: Jennifer Owens, female    DOB: Jan 08, 1971, 47 y.o.   MRN: [id number]  PCP: [name], FNP  Chief Complaint  Patient presents with  . Shortness of Breath    abnormal chest x-ray     Subjective:  HPI Jennifer Owens is a 47 y.o. female presents for follow-up of recent abnormal chest x-ray and dyspnea. Jennifer Owens presented in office on 02/22/2017 complaining of increased oxygen demand. She chronically uses home oxygen, 2 liters via nasal cannula, although recently had to titrate oxygen to 3 L in order to achieve adequate ventilation.  She obtained a chest x-ray to evaluate worsening shortness of breath on 02/22/2017.  Chest x-ray was significant for mild pulmonary edema and therefore the patient was placed on aggressive furosemide therapy orally and advised to return to the office the following week for follow-up. On 02/27/2017 visit, she continued to experienced shortness of breath although achieved improvement with diuretic therapy. She was to continue furosemide through today and obtain a repeat chest x-ray. Today, Jennifer Owens reports complete return to her normal baseline and is stable on 2 L of continuous oxygen.  She denies any worsening shortness of breath or wheezing.  She has lost a total of 8 lbs since starting diuretics on 02/22/2017. In the past, she was chronically prescribed Lasix, however once her edema and shortness of breath improved, medication was as needed.  Denies any chest tightness, wheezing, chest heaviness, or peripheral edema. Social History   Socioeconomic History  . Marital status: Legally Separated    Spouse name: Not on file  . Number of children: Not on file  . Years of education: Not on file  . Highest education level: Not on file  Social Needs  . Financial resource strain: Not on file  . Food insecurity - worry: Not on file  . Food insecurity - inability: Not on file  . Transportation needs - medical: Not on file  . Transportation needs - non-medical: Not  on file  Occupational History  . Occupation: CNA for [employer]  Tobacco Use  . Smoking status: Former Smoker    Packs/day: 0.25    Years: 10.00    Pack years: 2.50    Types: Cigarettes    Last attempt to quit: 07/2015    Years since quitting: 1.6  . Smokeless tobacco: Never Used  Substance and Sexual Activity  . Alcohol use: No    Alcohol/week: 0.6 oz    Types: 1 Standard drinks or equivalent per week    Comment: very rarely - 1-2 drinks/month  . Drug use: No  . Sexual activity: Not on file  Other Topics Concern  . Not on file  Social History Narrative  . Not on file    Family History  Problem Relation Age of Onset  . Diabetes Mellitus II Mother   . Hypertension Mother   . Diabetes Mellitus II Father    Review of Systems  Constitutional: Negative.   Respiratory: Positive for shortness of breath.        Oxygen dependence 2 liter Jennifer Owens   Gastrointestinal: Negative for blood in stool.  Genitourinary: Negative for hematuria.  Musculoskeletal: Negative.   Skin: Negative.   Hematological: Negative for adenopathy. Does not bruise/bleed easily.  Psychiatric/Behavioral: Negative.     Patient Active Problem List   Diagnosis Date Noted  . Obstructive sleep apnea 02/12/2017  . NICM (nonischemic cardiomyopathy) (HCC) 09/06/2015  . Pulmonary hypertension (HCC)   . Chronic combined  systolic and diastolic heart failure (HCC)   . Morbid obesity (HCC)   . Hypertensive heart disease 08/17/2015  . Hypoxia 07/22/2015  . Asthma 07/22/2015  . Thrombocytopenia (HCC) 07/22/2015  . Tobacco abuse 07/22/2015    No Known Allergies  Prior to Admission medications   Medication Sig Start Date End Date Taking? Authorizing Provider  acetaminophen (TYLENOL) 500 MG tablet Take 1,000 mg by mouth at bedtime as needed.   Yes [provider]  albuterol (PROVENTIL) (2.5 MG/3ML) 0.083% nebulizer solution Take 3 mLs (2.5 mg total) by nebulization every 6 (six) hours as needed  for wheezing or shortness of breath. 02/22/17  Yes [name], FNP  albuterol (VENTOLIN HFA) 108 (90 Base) MCG/ACT inhaler Inhale 2 puffs into the lungs every 6 (six) hours as needed for wheezing or shortness of breath. 10/26/16  Yes Jegede, Olugbemiga E, MD  benzonatate (TESSALON) 100 MG capsule TAKE 1-2 CAPSULES BY MOUTH 3 TIMES DAILY AS NEEDED FOR COUGH. 03/01/17  Yes [name], FNP  budesonide (PULMICORT) 0.25 MG/2ML nebulizer solution Take 2 mLs (0.25 mg total) by nebulization 2 (two) times daily. 02/22/17  Yes [name], FNP  budesonide-formoterol (SYMBICORT) 80-4.5 MCG/ACT inhaler INHALE 2 PUFFS INTO THE LUNGS 2 TIMES DAILY. 01/01/17  Yes [name], FNP  carvedilol (COREG) 12.5 MG tablet Take 1 tablet (12.5 mg total) by mouth 2 (two) times daily with a meal. 03/01/16  Yes Chilton Si, MD  cetirizine (ZYRTEC) 10 MG tablet Take 1 tablet (10 mg total) by mouth daily. 06/21/16  Yes [name], FNP  fluticasone (FLONASE) 50 MCG/ACT nasal spray Place 2 sprays into both nostrils daily. 02/22/17  Yes [name], FNP  furosemide (LASIX) 40 MG tablet Take 1 tablet (40 mg total) by mouth daily. 1 tablet by mouth daily and as needed 03/05/17  Yes [name], FNP  losartan (COZAAR) 50 MG tablet TAKE 1 TABLET BY MOUTH DAILY. 03/04/17  Yes Chilton Si, MD  meloxicam (MOBIC) 15 MG tablet TAKE 1 TABLET DAILY BY MOUTH. 03/01/17  Yes [name], FNP  Potassium Chloride ER 20 MEQ TBCR TAKE 1 TABLET BY MOUTH 2 TIMES DAILY. 03/04/17  Yes Chilton Si, MD  potassium chloride SA (K-DUR,KLOR-CON) 20 MEQ tablet Take 1 tablet (20 mEq total) by mouth 2 (two) times daily. Take with each dose of lasix. 02/23/17  Yes [name], FNP  VENTOLIN HFA 108 (90 Base) MCG/ACT inhaler INHALE 2 PUFFS INTO THE LUNGS EVERY 6 (SIX) HOURS AS NEEDED FOR WHEEZING OR SHORTNESS OF BREATH. 02/15/17  Yes [name], FNP    Past Medical, Surgical Family and  Social History reviewed and updated.    Objective:   Today's Vitals   03/05/17 0902  BP: 130/74  Pulse: 80  Resp: 12  Temp: 98.2 F (36.8 C)  TempSrc: Oral  Weight: (!) 333 lb (151 kg)  Height: 5' 3.5" (1.613 m)    Wt Readings from Last 3 Encounters:  03/05/17 (!) 333 lb (151 kg)  02/27/17 (!) 337 lb (152.9 kg)  02/22/17 (!) 341 lb (154.7 kg)    Physical Exam HENT:  Head:Normocephalicand atraumatic.  Eyes:Conjunctivaeand EOMare normal. Pupils are equal, round, and reactive to light.  Neck:Normal range of motion.Neck supple.No thyromegalypresent.  Cardiovascular:Normal rate,regular rhythm,normal heart soundsand intact distal pulses.  Pulmonary/Chest:Effort normal. Norespiratory distress. She hasdecreased breath sounds. She hasno wheezes. She hasno rhonchi. She hasno rales or crackles Musculoskeletal:Normal range of motion.  Lymphadenopathy: She has no  cervical adenopathy.  Neurological: She isalertand oriented to person, place, and time.  Skin: Skin iswarmand dry.  Psychiatric: She has anormal mood and affect. Herbehavior is normal.Judgmentand thought contentnormal.    Assessment & Plan:  1. Shortness of breath, secondary to pulmonary hypertension, chronic heart failure, and likely asthma.  Patient has returned to baseline with aggressive diuretic therapy.  She is able to maintain adequate ventilation on 2 L of continuous oxygen. Plan for now to continue furosemide at 40 mg daily with replacement potassium 20 MEQ. We will consider reducing furosemide to 20 mg daily if patient continues to diuresis and improve.  2. Abnormal chest xray-improving, treatment ongoing, clinical symptoms have improved Chest x-ray 02/22/17: Cardiac enlargement with vascular congestion and mild edema. Repeat Chest X-ray 03/05/2017: Cardiomegaly with mild central pulmonary vascular prominence but overall the appearance of the chest has improved slightly since theprevious  study. No alveolar edema or pneumonia. -Continue diuresis   3. Thrombocytopenia (HCC), referred to hematology. Appointment pending. Take extra precaution to avoid in trauma or injury due to increased risk of bleeding .  4. OSA (obstructive sleep apnea) -Sleep study scheduled   Meds ordered this encounter  Medications  . furosemide (LASIX) 40 MG tablet    Sig: Take 1 tablet (40 mg total) by mouth daily. 1 tablet by mouth daily and as needed    Dispense:  60 tablet    Refill:  3    Order Specific Question:   Supervising Provider    Answer:   Quentin Angst [[id number]]    RTC: 4 weeks-will discuss a referral to pulmonology, and schedule a follow-up with cardiology, check electrolytes, weight,   [name]. Tiburcio Pea, MSN, FNP-C [facility]  [address] [place], [place] [address] [phone]

## 2017-03-13 ENCOUNTER — Other Ambulatory Visit: Payer: Self-pay | Admitting: Cardiovascular Disease

## 2017-03-13 ENCOUNTER — Telehealth: Payer: Self-pay | Admitting: Cardiovascular Disease

## 2017-03-13 MED ORDER — CARVEDILOL 12.5 MG PO TABS: 12.5000 mg | ORAL_TABLET | Freq: Two times a day (BID) | ORAL | 1 refills | Status: AC

## 2017-03-13 NOTE — Telephone Encounter (Signed)
°*  STAT* If patient is at the pharmacy, call can be transferred to refill team.   1. Which medications need to be refilled? (please list name of each medication and dose if known) Carvediolol 12.5 mg   2. Which pharmacy/location (including street and city if local pharmacy) is medication to be sent to?[facility] on [address]  3. Do they need a 30 day or 90 day supply? 90

## 2017-03-20 ENCOUNTER — Inpatient Hospital Stay: Payer: Medicaid Other | Attending: Hematology and Oncology | Admitting: Hematology and Oncology

## 2017-03-20 ENCOUNTER — Inpatient Hospital Stay: Payer: Medicaid Other

## 2017-03-20 ENCOUNTER — Ambulatory Visit (HOSPITAL_BASED_OUTPATIENT_CLINIC_OR_DEPARTMENT_OTHER): Payer: Medicaid Other | Attending: Family Medicine | Admitting: Internal Medicine

## 2017-03-20 ENCOUNTER — Telehealth: Payer: Self-pay

## 2017-03-20 ENCOUNTER — Telehealth: Payer: Self-pay | Admitting: Hematology and Oncology

## 2017-03-20 VITALS — Ht 63.5 in | Wt 330.0 lb

## 2017-03-20 DIAGNOSIS — Z7951 Long term (current) use of inhaled steroids: Secondary | ICD-10-CM | POA: Diagnosis not present

## 2017-03-20 DIAGNOSIS — D696 Thrombocytopenia, unspecified: Secondary | ICD-10-CM

## 2017-03-20 DIAGNOSIS — E669 Obesity, unspecified: Secondary | ICD-10-CM | POA: Diagnosis not present

## 2017-03-20 DIAGNOSIS — G4733 Obstructive sleep apnea (adult) (pediatric): Secondary | ICD-10-CM | POA: Insufficient documentation

## 2017-03-20 DIAGNOSIS — Z79899 Other long term (current) drug therapy: Secondary | ICD-10-CM | POA: Insufficient documentation

## 2017-03-20 DIAGNOSIS — I10 Essential (primary) hypertension: Secondary | ICD-10-CM | POA: Insufficient documentation

## 2017-03-20 DIAGNOSIS — Z6841 Body Mass Index (BMI) 40.0 and over, adult: Secondary | ICD-10-CM | POA: Insufficient documentation

## 2017-03-20 DIAGNOSIS — R161 Splenomegaly, not elsewhere classified: Secondary | ICD-10-CM

## 2017-03-20 DIAGNOSIS — G4736 Sleep related hypoventilation in conditions classified elsewhere: Secondary | ICD-10-CM | POA: Diagnosis not present

## 2017-03-20 LAB — CBC WITH DIFFERENTIAL (CANCER CENTER ONLY)
BASOS ABS: 0 10*3/uL (ref 0.0–0.1)
Basophils Relative: 0 %
EOS ABS: 0.2 10*3/uL (ref 0.0–0.5)
Eosinophils Relative: 3 %
HEMATOCRIT: 43.3 % (ref 34.8–46.6)
Hemoglobin: 13.6 g/dL (ref 11.6–15.9)
Lymphocytes Relative: 29 %
Lymphs Abs: 2 10*3/uL (ref 0.9–3.3)
MCH: 31.9 pg (ref 25.1–34.0)
MCHC: 31.4 g/dL — AB (ref 31.5–36.0)
MCV: 101.6 fL — ABNORMAL HIGH (ref 79.5–101.0)
Monocytes Absolute: 0.7 10*3/uL (ref 0.1–0.9)
Monocytes Relative: 10 %
NEUTROS ABS: 4.1 10*3/uL (ref 1.5–6.5)
Neutrophils Relative %: 58 %
Platelet Count: 77 10*3/uL — ABNORMAL LOW (ref 145–400)
RBC: 4.26 MIL/uL (ref 3.70–5.45)
RDW: 13.1 % (ref 11.2–16.1)
WBC: 7.1 10*3/uL (ref 3.9–10.3)

## 2017-03-20 MED ORDER — GABAPENTIN 300 MG PO CAPS: 300.0000 mg | ORAL_CAPSULE | Freq: Three times a day (TID) | ORAL | 3 refills | Status: DC | PRN

## 2017-03-20 NOTE — Telephone Encounter (Signed)
Advise patient I will trial her on gabapentin 300 mg 3 times daily as needed for hand and joint pain.  If no improvement after consistently taking medication for 7-14 days, she should schedule follow-up appointment.  She can also continue the Tylenol 500 mg every 8 hours as needed with the gabapentin.  [name]. Tiburcio Pea, MSN, FNP-C [facility]  [address] [place], [place] [address] [phone]

## 2017-03-20 NOTE — Assessment & Plan Note (Signed)
Platelet counts ranged from 30 on 05/07/2014 to 59 December 2018 During June and July 2017 hospitalization platelet counts were between 45-106 The rest of the CBC and differential are normal   Differential diagnosis: 1. Low-grade ITP 2. medication induced: On further review patient is not taking any medications that are associated with thrombocytopenia 3. Bone marrow factors 4. Hepatitis B/C 5. Splenomegaly: No enlarged spleen was palpable to physical exam.  I recommended ultrasound evaluation for spleen Hepatitis B and C testing Previous HIV testing was negative If none of these were identified and it could be ITP.  As long as the patient is not bleeding and platelet counts remain over 50 we do not have to treat ITP.  I recommended watchful monitoring.  If her platelet count continues to decline then we may have to do a bone marrow biopsy

## 2017-03-20 NOTE — Telephone Encounter (Signed)
Gave patient AVS and calendar of upcoming April appointments.  °

## 2017-03-20 NOTE — Telephone Encounter (Signed)
Patient would like to know if she can be prescribed something else for the pain in her joints and hands. Patient states that the Tylenol is not helping at all.

## 2017-03-20 NOTE — Progress Notes (Signed)
[place] CONSULT NOTE  Patient Care Team: Scot Jun, FNP as PCP - General (Family Medicine)  CHIEF COMPLAINTS/PURPOSE OF CONSULTATION:  Thrombocytopenia  HISTORY OF PRESENTING ILLNESS:  Jennifer Owens 47 y.o. female is here because of thrombocytopenia.  Patient has had a history of low platelet counts for the past 2 years at least.  Platelet counts have fluctuated from 30,000 as the lowest to most recently 59,000 3 weeks ago.  Briefly she had platelet counts over 100 intermittently.  There is no clear trend in the platelet counts.  She has no issues with the hemoglobin or white blood cell count.  She does not have any complaints of bruising or bleeding.  Based on the most recent blood work showing a platelet count of 59, she was referred to see [place]. Patient is morbidly obese and has had CHF for which she is using Lasix.  She also has obstructive sleep apnea.  She used to take meloxicam for arthritis which she has now discontinued at the recommendation of her primary care physician. She gets menstrual cycles twice a year and each time they tend to be heavy and last for 7 days. Other than this she has no complaints of excessive bruising or bleeding.  I reviewed her records extensively and collaborated the history with the patient.   MEDICAL HISTORY:  Past Medical History:  Diagnosis Date  . Asthma   . Chronic combined systolic and diastolic CHF (congestive heart failure) ([place])    a. Echo 5/17: mild LVH, EF 55-60%, no RWMA, Gr 1 DD, mildly dilated Ao root, trivial eff //  b. Echo 6/17: mild conc LVH, EF 35-40%, RVE with mod RV dysfunction  . Hypertensive heart disease with CHF (congestive heart failure) ([place])   . Moderate to severe pulmonary hypertension ([place])    a RHC 7/17 with PAP 76/30  . Morbid obesity ([place])   . NICM (nonischemic cardiomyopathy) ([place])    a. [place] 7/17 with normal cors  . Obstructive sleep apnea 02/12/2017  . Sciatica   . Thrombocytopenia ([place])    . Tobacco abuse     SURGICAL HISTORY: Past Surgical History:  Procedure Laterality Date  . APPENDECTOMY    . CARDIAC CATHETERIZATION N/A 08/29/2015   Procedure: Right/Left Heart Cath and Coronary Angiography;  Surgeon: Belva Crome, MD;  Location: [facility];  Service: Cardiovascular;  Laterality: N/A;  . CESAREAN SECTION    . CHOLECYSTECTOMY    . SMALL INTESTINE SURGERY      SOCIAL HISTORY: Social History   Socioeconomic History  . Marital status: Legally Separated    Spouse name: Not on file  . Number of children: Not on file  . Years of education: Not on file  . Highest education level: Not on file  Social Needs  . Financial resource strain: Not on file  . Food insecurity - worry: Not on file  . Food insecurity - inability: Not on file  . Transportation needs - medical: Not on file  . Transportation needs - non-medical: Not on file  Occupational History  . Occupation: CNA for [employer]  Tobacco Use  . Smoking status: Former Smoker    Packs/day: 0.25    Years: 10.00    Pack years: 2.50    Types: Cigarettes    Last attempt to quit: 07/2015    Years since quitting: 1.6  . Smokeless tobacco: Never Used  Substance and Sexual Activity  . Alcohol use: No  Alcohol/week: 0.6 oz    Types: 1 Standard drinks or equivalent per week    Comment: very rarely - 1-2 drinks/month  . Drug use: No  . Sexual activity: Not on file  Other Topics Concern  . Not on file  Social History Narrative  . Not on file    FAMILY HISTORY: Family History  Problem Relation Age of Onset  . Diabetes Mellitus II Mother   . Hypertension Mother   . Diabetes Mellitus II Father     ALLERGIES:  has No Known Allergies.  MEDICATIONS:  Current Outpatient Medications  Medication Sig Dispense Refill  . acetaminophen (TYLENOL) 500 MG tablet Take 1,000 mg by mouth at bedtime as needed.    Marland Kitchen albuterol (PROVENTIL) (2.5 MG/3ML) 0.083% nebulizer solution Take 3 mLs (2.5 mg  total) by nebulization every 6 (six) hours as needed for wheezing or shortness of breath. 150 mL 1  . albuterol (VENTOLIN HFA) 108 (90 Base) MCG/ACT inhaler Inhale 2 puffs into the lungs every 6 (six) hours as needed for wheezing or shortness of breath. 54 g 3  . benzonatate (TESSALON) 100 MG capsule TAKE 1-2 CAPSULES BY MOUTH 3 TIMES DAILY AS NEEDED FOR COUGH. 60 capsule 0  . budesonide (PULMICORT) 0.25 MG/2ML nebulizer solution Take 2 mLs (0.25 mg total) by nebulization 2 (two) times daily. 60 mL 12  . budesonide-formoterol (SYMBICORT) 80-4.5 MCG/ACT inhaler INHALE 2 PUFFS INTO THE LUNGS 2 TIMES DAILY. 10.2 g 3  . carvedilol (COREG) 12.5 MG tablet Take 1 tablet (12.5 mg total) by mouth 2 (two) times daily with a meal. 180 tablet 1  . cetirizine (ZYRTEC) 10 MG tablet Take 1 tablet (10 mg total) by mouth daily. 30 tablet 11  . fluticasone (FLONASE) 50 MCG/ACT nasal spray Place 2 sprays into both nostrils daily. 16 g 6  . furosemide (LASIX) 40 MG tablet Take 1 tablet (40 mg total) by mouth daily. 1 tablet by mouth daily and as needed 60 tablet 3  . losartan (COZAAR) 50 MG tablet TAKE 1 TABLET BY MOUTH DAILY. 90 tablet 1  . meloxicam (MOBIC) 15 MG tablet TAKE 1 TABLET DAILY BY MOUTH. 30 tablet 0  . Potassium Chloride ER 20 MEQ TBCR TAKE 1 TABLET BY MOUTH 2 TIMES DAILY. 180 tablet 1  . potassium chloride SA (K-DUR,KLOR-CON) 20 MEQ tablet Take 1 tablet (20 mEq total) by mouth 2 (two) times daily. Take with each dose of lasix. 180 tablet 3  . VENTOLIN HFA 108 (90 Base) MCG/ACT inhaler INHALE 2 PUFFS INTO THE LUNGS EVERY 6 (SIX) HOURS AS NEEDED FOR WHEEZING OR SHORTNESS OF BREATH. 18 g 0   No current facility-administered medications for this visit.     REVIEW OF SYSTEMS:   Constitutional: Denies fevers, chills or abnormal night sweats Eyes: Denies blurriness of vision, double vision or watery eyes Ears, nose, mouth, throat, and face: Denies mucositis or sore throat Respiratory: Denies cough, dyspnea  or wheezes Cardiovascular: Leg swelling and shortness of breath to exertion Gastrointestinal:  Denies nausea, heartburn or change in bowel habits Skin: Denies abnormal skin rashes Lymphatics: Denies new lymphadenopathy or easy bruising Neurological:Denies numbness, tingling or new weaknesses Behavioral/Psych: Mood is stable, no new changes   All other systems were reviewed with the patient and are negative.  PHYSICAL EXAMINATION: ECOG PERFORMANCE STATUS: 1 - Symptomatic but completely ambulatory  Vitals:   03/20/17 1305  BP: (!) 151/90  Pulse: 89  Resp: 16  Temp: 98.4 F (36.9 C)  SpO2: 93%  Filed Weights   03/20/17 1305  Weight: (!) 336 lb 3.2 oz (152.5 kg)    GENERAL:alert, no distress and comfortable SKIN: skin color, texture, turgor are normal, no rashes or significant lesions EYES: normal, conjunctiva are pink and non-injected, sclera clear OROPHARYNX:no exudate, no erythema and lips, buccal mucosa, and tongue normal  NECK: supple, thyroid normal size, non-tender, without nodularity LYMPH:  no palpable lymphadenopathy in the cervical, axillary or inguinal LUNGS: clear to auscultation and percussion with normal breathing effort HEART: regular rate & rhythm and no murmurs and no lower extremity edema ABDOMEN:abdomen soft, non-tender and normal bowel sounds Musculoskeletal:no cyanosis of digits and no clubbing  PSYCH: alert & oriented x 3 with fluent speech NEURO: no focal motor/sensory deficits   LABORATORY DATA:  I have reviewed the data as listed Lab Results  Component Value Date   WBC 6.5 02/22/2017   HGB 12.5 02/22/2017   HCT 38.9 02/22/2017   MCV 97 02/22/2017   PLT 59 (LL) 02/22/2017   Lab Results  Component Value Date   NA 142 02/12/2017   K 4.7 02/12/2017   CL 99 02/12/2017   CO2 30 (H) 02/12/2017    RADIOGRAPHIC STUDIES: I have personally reviewed the radiological reports and agreed with the findings in the report.  ASSESSMENT AND PLAN:   Thrombocytopenia ([place]) Platelet counts ranged from 30 on 05/07/2014 to 59 December 2018 During June and July 2017 hospitalization platelet counts were between 45-106 The rest of the CBC and differential are normal   Differential diagnosis: 1. Low-grade ITP 2. medication induced: On further review patient is not taking any medications that are associated with thrombocytopenia 3. Bone marrow factors 4. Hepatitis B/C 5. Splenomegaly: Could not palpate any enlarged spleen due to obesity.  It is possible that the patient could have fatty liver and splenomegaly as result of that.  I recommended ultrasound evaluation for spleen Hepatitis B and C testing Previous HIV testing was negative If none of these were identified it could be ITP.     As long as the patient is not bleeding and platelet counts remain over 50 we do not have to treat ITP.  I recommended watchful monitoring.  If her platelet count continues to decline then we may have to do a bone marrow biopsy We will see her back in 3 months with labs and follow-up   All questions were answered. The patient knows to call the clinic with any problems, questions or concerns.    Harriette Ohara, MD 03/20/17

## 2017-03-21 LAB — HEPATITIS C ANTIBODY: HCV Ab: <0.1 s/co ratio (ref 0.0–0.9)

## 2017-03-21 LAB — HEPATITIS B SURFACE ANTIGEN: Hepatitis B Surface Ag: NEGATIVE

## 2017-03-21 NOTE — Telephone Encounter (Signed)
Patient notified and will pick up medication  

## 2017-03-27 ENCOUNTER — Ambulatory Visit (HOSPITAL_COMMUNITY)
Admission: RE | Admit: 2017-03-27 | Discharge: 2017-03-27 | Disposition: A | Discharge status: Home / Self Care | Payer: Medicaid Other | Source: Ambulatory Visit | Attending: Hematology and Oncology | Admitting: Hematology and Oncology

## 2017-03-27 DIAGNOSIS — Z9049 Acquired absence of other specified parts of digestive tract: Secondary | ICD-10-CM | POA: Insufficient documentation

## 2017-03-27 DIAGNOSIS — R161 Splenomegaly, not elsewhere classified: Secondary | ICD-10-CM | POA: Insufficient documentation

## 2017-03-27 DIAGNOSIS — D696 Thrombocytopenia, unspecified: Secondary | ICD-10-CM

## 2017-03-27 DIAGNOSIS — K760 Fatty (change of) liver, not elsewhere classified: Secondary | ICD-10-CM | POA: Insufficient documentation

## 2017-03-30 DIAGNOSIS — G4733 Obstructive sleep apnea (adult) (pediatric): Secondary | ICD-10-CM

## 2017-03-31 NOTE — Procedures (Signed)
Patient Name: Jennifer Owens, Jennifer Owens Date: 03/20/2017 Gender: Female D.O.B: August 23, 1970 Age (years): 46 Referring Provider: Molli Barrows FNP Height (inches): 94 Interpreting Physician: Baird Lyons MD, ABSM Weight (lbs): 330 RPSGT: Laren Everts BMI: 58 MRN: [id number] Neck Size: 18.50 <br> <br> CLINICAL INFORMATION Sleep Study Type: Split Night CPAP Indication for sleep study: Fatigue, Hypertension, Obesity, OSA, Re-Evaluation, Snoring, Witnessed Apneas  Epworth Sleepiness Score: 8  SLEEP STUDY TECHNIQUE As per the AASM Manual for the Scoring of Sleep and Associated Events v2.3 (April 2016) with a hypopnea requiring 4% desaturations.  The channels recorded and monitored were frontal, central and occipital EEG, electrooculogram (EOG), submentalis EMG (chin), nasal and oral airflow, thoracic and abdominal wall motion, anterior tibialis EMG, snore microphone, electrocardiogram, and pulse oximetry. Continuous positive airway pressure (CPAP) was initiated when the patient met split night criteria and was titrated according to treat sleep-disordered breathing.  MEDICATIONS Medications self-administered by patient taken the night of the study : TYLENOL, CARVEDILOL, POTASSIUM CHLORIDE, SYMBICORT, ALBUTEROL  RESPIRATORY PARAMETERS Diagnostic  Total AHI (/hr): 72.6 RDI (/hr): 73.0 OA Index (/hr): 6.3 CA Index (/hr): 0.0 REM AHI (/hr): 91.1 NREM AHI (/hr): 67.4 Supine AHI (/hr): 87.3 Non-supine AHI (/hr): 68.34 Min O2 Sat (%): 57.00 Mean O2 (%): 79.24 Time below 88% (min): 122.4   Titration  Optimal Pressure (cm): 13 AHI at Optimal Pressure (/hr): 50.0 Min O2 at Optimal Pressure (%): 66.0 Supine % at Optimal (%): 66 Sleep % at Optimal (%): 74   SLEEP ARCHITECTURE The recording time for the entire night was 404.9 minutes.  During a baseline period of 179.5 minutes, the patient slept for 123.2 minutes in REM and nonREM, yielding a sleep efficiency of 68.6%. Sleep onset after lights  out was 44.3 minutes with a REM latency of 96.0 minutes. The patient spent 6.49% of the night in stage N1 sleep, 71.59% in stage N2 sleep, 0.00% in stage N3 and 21.91% in REM.  During the titration period of 215.3 minutes, the patient slept for 96.0 minutes in REM and nonREM, yielding a sleep efficiency of 44.6%. Sleep onset after CPAP initiation was 98.3 minutes with a REM latency of 47.5 minutes. The patient spent 10.42% of the night in stage N1 sleep, 68.75% in stage N2 sleep, 0.00% in stage N3 and 20.83% in REM.  CARDIAC DATA The 2 lead EKG demonstrated sinus rhythm. The mean heart rate was 79.42 beats per minute. Other EKG findings include: PVCs.  LEG MOVEMENT DATA The total Periodic Limb Movements of Sleep (PLMS) were 0. The PLMS index was 0.00 .  IMPRESSIONS - Severe obstructive sleep apnea occurred during the diagnostic portion of the study (AHI = 72.6/hour). An optimal PAP pressure could not be deterrmined in the available time. There were significant residual apneas at CPAP 13. - No significant central sleep apnea occurred during the diagnostic portion of the study (CAI = 0.0/hour). - Severe oxygen desaturation was noted during the diagnostic portion of the study (Min O2 = 57.00%).Supplemental O2 1L/ min added through CPAP at 05:16 due to persistent saturation   - The patient snored with moderate snoring volume during the diagnostic portion of the study. - No cardiac abnormalities were noted during this study. - Clinically significant periodic limb movements did not occur during sleep.  DIAGNOSIS - Obstructive Sleep Apnea (327.23 [G47.33 ICD-10]) - Nocturnal Hypoxemia (327.26 [G47.36 ICD-10])  RECOMMENDATIONS - Recommend return for full CPAP titration study, allowing time for effective titration and documentation if supplemental O2 is still needed at therapeutic  CPAP.  - Be careful with alcohol, sedatives and other CNS depressants that may worsen sleep apnea and disrupt normal sleep  architecture. - Sleep hygiene should be reviewed to assess factors that may improve sleep quality. - Weight management and regular exercise should be initiated or continued.  [Electronically signed] 03/31/2017 02:07 PM  Baird Lyons MD, [place], American Board of Sleep Medicine   NPI: [id number]                          [place] [place] [facility]  ELECTRONICALLY SIGNED ON:  03/31/2017, 2:01 PM [place] PH: [phone]   FX: [phone] [place]

## 2017-04-01 ENCOUNTER — Telehealth: Payer: Self-pay | Admitting: Family Medicine

## 2017-04-01 NOTE — Telephone Encounter (Signed)
Contact patient to advise that I am ordering a CPAP titration study which will be performed at sleep medicine.  Her study was significant for obstructive sleep apnea however further testing is needed in order to prescribe the correct settings for the CPAP machine.  She will be contacted by sleep medicine to schedule the titration study.   [name]. Tiburcio Pea, MSN, FNP-C [facility]  [address] [place] [place], [place] [address] [phone]

## 2017-04-01 NOTE — Progress Notes (Signed)
Contact patient to advise that I am ordering a CPAP titration study which will be performed at sleep medicine.  Her study was significant for obstructive sleep apnea however further testing is needed in order to prescribe the correct settings for the CPAP machine.  She will be contacted by sleep medicine to schedule the titration study.   Jennifer Owens [name], MSN, FNP-C [facility]  [address] [place], [place] [address] [phone]  

## 2017-04-01 NOTE — Addendum Note (Signed)
Addended by: [name] on: 04/01/2017 02:42 PM   Modules accepted: Orders

## 2017-04-02 ENCOUNTER — Encounter (INDEPENDENT_AMBULATORY_CARE_PROVIDER_SITE_OTHER): Payer: Medicaid Other

## 2017-04-02 MED FILL — GABAPENTIN 300 MG CAPSULE: 300 | 30 days supply | Qty: 90 | Fill #0

## 2017-04-02 MED FILL — LOSARTAN POTASSIUM 50 MG TA: 50 | 30 days supply | Qty: 30 | Fill #1

## 2017-04-02 MED FILL — SYMBICORT 80-4.5 MCG INH: 80-4.5 | 30 days supply | Qty: 10 | Fill #2

## 2017-04-02 NOTE — Telephone Encounter (Signed)
Left another vm for patient to callback  

## 2017-04-02 NOTE — Telephone Encounter (Signed)
Patient notified and already scheduled

## 2017-04-02 NOTE — Telephone Encounter (Signed)
Left a vm for patient to callback 

## 2017-04-05 ENCOUNTER — Ambulatory Visit (HOSPITAL_COMMUNITY)
Admission: RE | Admit: 2017-04-05 | Discharge: 2017-04-05 | Disposition: A | Discharge status: Home / Self Care | Payer: Medicaid Other | Source: Ambulatory Visit | Attending: Family Medicine | Admitting: Family Medicine

## 2017-04-05 ENCOUNTER — Ambulatory Visit (INDEPENDENT_AMBULATORY_CARE_PROVIDER_SITE_OTHER): Payer: Medicaid Other | Admitting: Family Medicine

## 2017-04-05 ENCOUNTER — Encounter: Payer: Self-pay | Admitting: Family Medicine

## 2017-04-05 ENCOUNTER — Telehealth: Payer: Self-pay | Admitting: Family Medicine

## 2017-04-05 VITALS — BP 136/84 | HR 86 | Temp 98.4°F | Ht 63.5 in | Wt 340.0 lb

## 2017-04-05 DIAGNOSIS — R0602 Shortness of breath: Secondary | ICD-10-CM | POA: Insufficient documentation

## 2017-04-05 DIAGNOSIS — J9801 Acute bronchospasm: Secondary | ICD-10-CM

## 2017-04-05 DIAGNOSIS — I272 Pulmonary hypertension, unspecified: Secondary | ICD-10-CM

## 2017-04-05 DIAGNOSIS — R062 Wheezing: Secondary | ICD-10-CM | POA: Diagnosis not present

## 2017-04-05 MED ORDER — ACETAMINOPHEN-CODEINE #3 300-30 MG PO TABS: 0.5000 | ORAL_TABLET | Freq: Three times a day (TID) | ORAL | 0 refills | Status: AC | PRN

## 2017-04-05 MED ORDER — PREDNISONE 20 MG PO TABS: 40.0000 mg | ORAL_TABLET | Freq: Every day | ORAL | 0 refills | Status: DC

## 2017-04-05 MED ORDER — IPRATROPIUM-ALBUTEROL 0.5-2.5 (3) MG/3ML IN SOLN: 3.0000 mL | Freq: Once | RESPIRATORY_TRACT | Status: DC

## 2017-04-05 MED ORDER — METHYLPREDNISOLONE SODIUM SUCC 125 MG IJ SOLR: 125.0000 mg | Freq: Once | INTRAMUSCULAR | 0 refills | Status: AC

## 2017-04-05 MED ORDER — IPRATROPIUM BROMIDE 0.02 % IN SOLN
0.5000 mg | Freq: Once | RESPIRATORY_TRACT | Status: AC
  Administered 2017-04-05: 0.5 mg via RESPIRATORY_TRACT

## 2017-04-05 MED ORDER — METHYLPREDNISOLONE SODIUM SUCC 125 MG IJ SOLR
125.0000 mg | Freq: Once | INTRAMUSCULAR | Status: AC
  Administered 2017-04-05: 125 mg via INTRAMUSCULAR

## 2017-04-05 MED ORDER — ALBUTEROL SULFATE (2.5 MG/3ML) 0.083% IN NEBU
2.5000 mg | INHALATION_SOLUTION | Freq: Once | RESPIRATORY_TRACT | Status: AC
  Administered 2017-04-05: 2.5 mg via RESPIRATORY_TRACT

## 2017-04-05 NOTE — Telephone Encounter (Signed)
Patient notified of normal chest x-ray and continue medication as prescribed.   [name]. Tiburcio Pea, MSN, FNP-C [facility]  [address] [place], [place] [address] [phone]

## 2017-04-05 NOTE — Telephone Encounter (Signed)
Copied from CRM [id number]. Topic: Quick Communication - Rx Refill/Question >> Apr 05, 2017  4:47 PM Landry Mellow wrote: Medication:  predniSONE (DELTASONE) 20 MG tablet and methylPREDNISolone sodium succinate (SOLU-MEDROL) 125 mg/2 mL injection   Has the patient contacted their pharmacy? Yes.     (Agent: If no, request that the patient contact the pharmacy for the refill.)   Preferred Pharmacy (with phone number or street name): [facility] [address]  - pharm called they want to know if pt will be on both of these medications.    Agent: Please be advised that RX refills may take up to 3 business days. We ask that you follow-up with your pharmacy.

## 2017-04-05 NOTE — Patient Instructions (Signed)
Increase your oxygen to 3 or 4 liters as needed to improve breathing effort.   Shortness of Breath, Adult Shortness of breath means you have trouble breathing. Your lungs are organs for breathing. Follow these instructions at home: Pay attention to any changes in your symptoms. Take these actions to help with your condition:  Do not smoke. Smoking can cause shortness of breath. If you need help to quit smoking, ask your doctor.  Avoid things that can make it harder to breathe, such as: ? Mold. ? Dust. ? Air pollution. ? Chemical smells. ? Things that can cause allergy symptoms (allergens), if you have allergies.  Keep your living space clean and free of mold and dust.  Rest as needed. Slowly return to your usual activities.  Take over-the-counter and prescription medicines, including oxygen and inhaled medicines, only as told by your doctor.  Keep all follow-up visits as told by your doctor. This is important.  Contact a doctor if:  Your condition does not get better as soon as expected.  You have a hard time doing your normal activities, even after you rest.  You have new symptoms. Get help right away if:  You have trouble breathing when you are resting.  You feel light-headed or you faint.  You have a cough that is not helped by medicines.  You cough up blood.  You have pain with breathing.  You have pain in your chest, arms, shoulders, or belly (abdomen).  You have a fever.  You cannot walk up stairs.  You cannot exercise the way you normally do. This information is not intended to replace advice given to you by your health care provider. Make sure you discuss any questions you have with your health care provider. Document Released: 08/01/2007 Document Revised: 03/01/2016 Document Reviewed: 03/01/2016 Elsevier Interactive Patient Education  2017 [employer].

## 2017-04-05 NOTE — Telephone Encounter (Signed)
[facility]ified [facility] that the medications were not prescribed by provider in this office.  In the chart the pt was seen by Jolee Ewing and prescribed these medications. [facility], [phone] for clarification.

## 2017-04-05 NOTE — Progress Notes (Signed)
Patient ID: Jennifer Owens, female    DOB: January 03, 1971, 47 y.o.   MRN: [id number]  PCP: [name], FNP  Chief Complaint  Patient presents with  . Follow-up    Referrals,SOB,weight   Subjective:  HPI Jennifer Owens is a 47 y.o. female with CHF, Asthma, Pulmonary HTN, OSA, presents for evaluation of  worsening shortness of breath, wheezing, and fatigue. Tomeshia is oxygen dependent, chronic hypoxia is managed with 2 liters of continuous oxygen. Reports over the course of the last several days experiencing coughing, worsening wheezing, shortness of breath, and increased phlegm production. She reports using her albuterol inhaler as prescribed. She still has not received her nebulizer machine which was ordered several weeks prior and [facility].  She continues lasix as she experience acute onset of pulmonary edema over 1 month ago and was resumed on daily diuretic therapy. Unknown of any unintentional weight gain. Chest tightness has been present although occurs mostly in the morning hours. Denies the presence of flu-like symptoms: body aches, fever, or headache. Social History   Socioeconomic History  . Marital status: Legally Separated    Spouse name: Not on file  . Number of children: Not on file  . Years of education: Not on file  . Highest education level: Not on file  Social Needs  . Financial resource strain: Not on file  . Food insecurity - worry: Not on file  . Food insecurity - inability: Not on file  . Transportation needs - medical: Not on file  . Transportation needs - non-medical: Not on file  Occupational History  . Occupation: Unemployed   Tobacco Use  . Smoking status: Former Smoker    Packs/day: 0.25    Years: 10.00    Pack years: 2.50    Types: Cigarettes    Last attempt to quit: 07/2015    Years since quitting: 1.6  . Smokeless tobacco: Never Used  Substance and Sexual Activity  . Alcohol use: No    Alcohol/week: 0.6 oz     Types: 1 Standard drinks or equivalent per week    Comment: very rarely - 1-2 drinks/month  . Drug use: No  . Sexual activity: Not on file  Other Topics Concern  . Not on file  Social History Narrative  . Not on file    Family History  Problem Relation Age of Onset  . Diabetes Mellitus II Mother   . Hypertension Mother   . Diabetes Mellitus II Father    Review of Systems pertinent negatives noted in HPI Patient Active Problem List   Diagnosis Date Noted  . Obstructive sleep apnea 02/12/2017  . NICM (nonischemic cardiomyopathy) (HCC) 09/06/2015  . Pulmonary hypertension (HCC)   . Chronic combined systolic and diastolic heart failure (HCC)   . Morbid obesity (HCC)   . Hypertensive heart disease 08/17/2015  . Hypoxia 07/22/2015  . Asthma 07/22/2015  . Thrombocytopenia (HCC) 07/22/2015  . Tobacco abuse 07/22/2015    No Known Allergies  Prior to Admission medications   Medication Sig Start Date End Date Taking? Authorizing Provider  acetaminophen (TYLENOL) 500 MG tablet Take 1,000 mg by mouth at bedtime as needed.   Yes [provider]  albuterol (PROVENTIL) (2.5 MG/3ML) 0.083% nebulizer solution Take 3 mLs (2.5 mg total) by nebulization every 6 (six) hours as needed for wheezing or shortness of breath. 02/22/17  Yes [name], FNP  albuterol (VENTOLIN HFA) 108 (90 Base) MCG/ACT inhaler Inhale 2  puffs into the lungs every 6 (six) hours as needed for wheezing or shortness of breath. 10/26/16  Yes Jegede, Olugbemiga E, MD  budesonide (PULMICORT) 0.25 MG/2ML nebulizer solution Take 2 mLs (0.25 mg total) by nebulization 2 (two) times daily. 02/22/17  Yes [name], FNP  budesonide-formoterol (SYMBICORT) 80-4.5 MCG/ACT inhaler INHALE 2 PUFFS INTO THE LUNGS 2 TIMES DAILY. 01/01/17  Yes [name], FNP  carvedilol (COREG) 12.5 MG tablet Take 1 tablet (12.5 mg total) by mouth 2 (two) times daily with a meal. 03/13/17  Yes Chilton Si, MD   cetirizine (ZYRTEC) 10 MG tablet Take 1 tablet (10 mg total) by mouth daily. 06/21/16  Yes [name], FNP  fluticasone (FLONASE) 50 MCG/ACT nasal spray Place 2 sprays into both nostrils daily. 02/22/17  Yes [name], FNP  furosemide (LASIX) 40 MG tablet Take 1 tablet (40 mg total) by mouth daily. 1 tablet by mouth daily and as needed 03/05/17  Yes [name], FNP  gabapentin (NEURONTIN) 300 MG capsule Take 1 capsule (300 mg total) by mouth 3 (three) times daily as needed. 03/20/17  Yes [name], FNP  losartan (COZAAR) 50 MG tablet TAKE 1 TABLET BY MOUTH DAILY. 03/04/17  Yes Chilton Si, MD  meloxicam (MOBIC) 15 MG tablet TAKE 1 TABLET DAILY BY MOUTH. 03/01/17  Yes [name], FNP  Potassium Chloride ER 20 MEQ TBCR TAKE 1 TABLET BY MOUTH 2 TIMES DAILY. 03/04/17  Yes Chilton Si, MD  potassium chloride SA (K-DUR,KLOR-CON) 20 MEQ tablet Take 1 tablet (20 mEq total) by mouth 2 (two) times daily. Take with each dose of lasix. 02/23/17  Yes [name], FNP  VENTOLIN HFA 108 (90 Base) MCG/ACT inhaler INHALE 2 PUFFS INTO THE LUNGS EVERY 6 (SIX) HOURS AS NEEDED FOR WHEEZING OR SHORTNESS OF BREATH. 02/15/17  Yes [name], FNP  benzonatate (TESSALON) 100 MG capsule TAKE 1-2 CAPSULES BY MOUTH 3 TIMES DAILY AS NEEDED FOR COUGH. Patient not taking: Reported on 04/05/2017 03/01/17   [name], FNP    Past Medical, Surgical Family and Social History reviewed and updated.    Objective:   Today's Vitals   04/05/17 1428  BP: 136/84  Pulse: 86  Temp: 98.4 F (36.9 C)  TempSrc: Oral  SpO2: 92%  Weight: (!) 340 lb (154.2 kg)  Height: 5' 3.5" (1.613 m)    Wt Readings from Last 3 Encounters:  04/05/17 (!) 340 lb (154.2 kg)  03/20/17 (!) 330 lb (149.7 kg)  03/20/17 (!) 336 lb 3.2 oz (152.5 kg)    Physical Exam HENT:  Head:Normocephalicand atraumatic.  Eyes:Conjunctivaeand EOMare normal. Pupils are equal, round, and reactive to  light.  Neck:Normal range of motion.Neck supple.No thyromegalypresent.  Cardiovascular:Normal rate,regular rhythm,normal heart soundsand intact distal pulses.  Pulmonary/Chest:Effort normal. Norespiratory distress. She hasdecreased breath sounds. She has wheezes. She hasrhonchi. She hasno rales. Musculoskeletal:Normal range of motion.  Lymphadenopathy: She has no cervical adenopathy.  Neurological: She isalertand oriented to person, place, and time.  Skin: Skin iswarmand dry.  Psychiatric: She has anormal mood and affect. Herbehavior is normal.  Assessment & Plan:  1. Bronchospasm,  2. Shortness of breath 3. Pulmonary HTN (HCC) Will obtain a  DG Chest 2 View to rule out pulmonary edema. Administer Duo neb in office and Solumedrol 125 mg solumedrol subcutaneous IM. Will treat with course of prednisone 40 mg daily x 5 days.  Titrate oxygen up to 3-4 liters as oxygen demand increases.  Meds  ordered this encounter  Medications  . DISCONTD: ipratropium-albuterol (DUONEB) 0.5-2.5 (3) MG/3ML nebulizer solution 3 mL  . methylPREDNISolone sodium succinate (SOLU-MEDROL) 125 mg/2 mL injection    Sig: Inject 2 mLs (125 mg total) into the vein once for 1 dose.    Dispense:  1 each    Refill:  0    Order Specific Question:   Supervising Provider    Answer:   Quentin Angst [id number]  . DISCONTD: predniSONE (DELTASONE) 20 MG tablet    Sig: Take 2 tablets (40 mg total) by mouth daily with breakfast.    Dispense:  10 tablet    Refill:  0    Order Specific Question:   Supervising Provider    Answer:   Quentin Angst [id number]  . acetaminophen-codeine (TYLENOL #3) 300-30 MG tablet    Sig: Take 0.5-1 tablets by mouth every 8 (eight) hours as needed for moderate pain or severe pain (Do not consume greater than 2000 mg acetiminophen within 24 hours).    Dispense:  30 tablet    Refill:  0    Order Specific Question:   Supervising Provider    Answer:   Quentin Angst [id number]  . ipratropium (ATROVENT) nebulizer solution 0.5 mg  . albuterol (PROVENTIL) (2.5 MG/3ML) 0.083% nebulizer solution 2.5 mg  . methylPREDNISolone sodium succinate (SOLU-MEDROL) 125 mg/2 mL injection 125 mg     Orders Placed This Encounter  Procedures  . DG Chest 2 View    Standing Status:   Future    Number of Occurrences:   1    Standing Expiration Date:   04/05/2018    Order Specific Question:   Reason for Exam (SYMPTOM  OR DIAGNOSIS REQUIRED)    Answer:   sob, pulmonary htn, and weight gain    Order Specific Question:   Is the patient pregnant?    Answer:   No    Order Specific Question:   Preferred imaging location?    Answer:   External    Order Specific Question:   Call Results- Best Contact Number?    Answer:   [phone]-please call if abnormal    RTC: 5 days to re-evaluate symptoms. You will be notified via phone of chest x-ray results.   [name]. Tiburcio Pea, MSN, FNP-C [facility]  [address] [place] [place], [place] [address] [phone]

## 2017-04-08 ENCOUNTER — Ambulatory Visit (INDEPENDENT_AMBULATORY_CARE_PROVIDER_SITE_OTHER): Payer: Self-pay | Admitting: Family Medicine

## 2017-04-08 ENCOUNTER — Encounter (INDEPENDENT_AMBULATORY_CARE_PROVIDER_SITE_OTHER): Payer: Self-pay

## 2017-04-09 ENCOUNTER — Other Ambulatory Visit: Payer: Self-pay | Admitting: Family Medicine

## 2017-04-09 MED FILL — PROAIR HFA 90 MCG INHALER: 108 (90 BAS | 25 days supply | Qty: 9 | Fill #0

## 2017-04-09 MED FILL — ALL DAY ALLERGY 10 MG TAB: 10 | 30 days supply | Qty: 30 | Fill #2

## 2017-04-10 ENCOUNTER — Encounter: Payer: Self-pay | Admitting: Family Medicine

## 2017-04-10 ENCOUNTER — Ambulatory Visit (INDEPENDENT_AMBULATORY_CARE_PROVIDER_SITE_OTHER): Payer: Medicaid Other | Admitting: Family Medicine

## 2017-04-10 VITALS — BP 118/66 | HR 80 | Temp 98.0°F | Resp 14 | Ht 63.5 in | Wt 338.0 lb

## 2017-04-10 DIAGNOSIS — R062 Wheezing: Secondary | ICD-10-CM

## 2017-04-10 DIAGNOSIS — R0602 Shortness of breath: Secondary | ICD-10-CM | POA: Diagnosis not present

## 2017-04-10 DIAGNOSIS — J40 Bronchitis, not specified as acute or chronic: Secondary | ICD-10-CM

## 2017-04-10 MED ORDER — IPRATROPIUM BROMIDE 0.02 % IN SOLN
0.5000 mg | Freq: Once | RESPIRATORY_TRACT | Status: AC
  Administered 2017-04-10: 0.5 mg via RESPIRATORY_TRACT

## 2017-04-10 MED ORDER — ALBUTEROL SULFATE (2.5 MG/3ML) 0.083% IN NEBU
2.5000 mg | INHALATION_SOLUTION | Freq: Once | RESPIRATORY_TRACT | Status: AC
  Administered 2017-04-10: 2.5 mg via RESPIRATORY_TRACT

## 2017-04-10 MED ORDER — AMOXICILLIN-POT CLAVULANATE 875-125 MG PO TABS: 1.0000 | ORAL_TABLET | Freq: Two times a day (BID) | ORAL | 0 refills | Status: DC

## 2017-04-10 MED ORDER — FLUCONAZOLE 150 MG PO TABS: 150.0000 mg | ORAL_TABLET | Freq: Once | ORAL | 0 refills | Status: AC

## 2017-04-10 MED ORDER — ALBUTEROL SULFATE HFA 108 (90 BASE) MCG/ACT IN AERS: 2.0000 | INHALATION_SPRAY | RESPIRATORY_TRACT | 3 refills | Status: AC | PRN

## 2017-04-10 MED ORDER — IPRATROPIUM-ALBUTEROL 0.5-2.5 (3) MG/3ML IN SOLN: 3.0000 mL | Freq: Once | RESPIRATORY_TRACT | Status: DC

## 2017-04-10 MED ORDER — METHYLPREDNISOLONE SODIUM SUCC 125 MG IJ SOLR
80.0000 mg | Freq: Once | INTRAMUSCULAR | Status: AC
Start: 2017-04-10 — End: 2017-04-10
  Administered 2017-04-10: 80 mg via INTRAMUSCULAR

## 2017-04-10 MED ORDER — PREDNISONE 20 MG PO TABS: 20.0000 mg | ORAL_TABLET | Freq: Every day | ORAL | 0 refills | Status: DC

## 2017-04-10 MED FILL — PROAIR HFA 90 MCG INHALER: 108 (90 BAS | 16 days supply | Qty: 9 | Fill #0

## 2017-04-10 MED FILL — FLUCONAZOLE 150 MG TABLET: 150 | 1 days supply | Qty: 1 | Fill #0

## 2017-04-10 MED FILL — predniSONE 20 MG TABS: 20 | 5 days supply | Qty: 5 | Fill #0

## 2017-04-10 MED FILL — AMOX-CLAV 875-125 MG TABLET: 875-125 | 10 days supply | Qty: 20 | Fill #0

## 2017-04-10 NOTE — Progress Notes (Signed)
Patient ID: Jennifer Owens, female    DOB: 1970/10/08, 47 y.o.   MRN: [id number]  PCP: [name], FNP  Chief Complaint  Patient presents with  . Follow-up    wheezing,sob,    Subjective:  HPI Jennifer Owens is a 47 y.o. female with severe asthma, pulmonary hypertension, morbid obesity, presents for follow-up of recent asthma exacerbation. Jennifer Owens was seen in office on 04/05/2017 with a complaint of worsening shortness of breath, wheezing, and increased oxygen demand. She was treated with an office duo nebulizer, administer Solu-Medrol injection, and placed on prednisone dose for 5 days.  Today she reports she continues to have progressive shortness of breath, congestion, cough with productive yellow greenish sputum, and chest tightness.  Recent chest x-ray was normal.  Patient has had a history of pulmonary edema.  He denies fever or body aches.  Denies any known recent influenza exposure. Social History   Socioeconomic History  . Marital status: Legally Separated    Spouse name: Not on file  . Number of children: Not on file  . Years of education: Not on file  . Highest education level: Not on file  Social Needs  . Financial resource strain: Not on file  . Food insecurity - worry: Not on file  . Food insecurity - inability: Not on file  . Transportation needs - medical: Not on file  . Transportation needs - non-medical: Not on file  Occupational History  . Occupation: CNA for [employer]  Tobacco Use  . Smoking status: Former Smoker    Packs/day: 0.25    Years: 10.00    Pack years: 2.50    Types: Cigarettes    Last attempt to quit: 07/2015    Years since quitting: 1.7  . Smokeless tobacco: Never Used  Substance and Sexual Activity  . Alcohol use: No    Alcohol/week: 0.6 oz    Types: 1 Standard drinks or equivalent per week    Comment: very rarely - 1-2 drinks/month  . Drug use: No  . Sexual activity: Not on file  Other Topics Concern  . Not on file   Social History Narrative  . Not on file    Family History  Problem Relation Age of Onset  . Diabetes Mellitus II Mother   . Hypertension Mother   . Diabetes Mellitus II Father    Review of Systems Pertinent negatives indicated in HPI Patient Active Problem List   Diagnosis Date Noted  . Obstructive sleep apnea 02/12/2017  . NICM (nonischemic cardiomyopathy) (HCC) 09/06/2015  . Pulmonary hypertension (HCC)   . Chronic combined systolic and diastolic heart failure (HCC)   . Morbid obesity (HCC)   . Hypertensive heart disease 08/17/2015  . Hypoxia 07/22/2015  . Asthma 07/22/2015  . Thrombocytopenia (HCC) 07/22/2015  . Tobacco abuse 07/22/2015    No Known Allergies  Prior to Admission medications   Medication Sig Start Date End Date Taking? Authorizing Provider  acetaminophen (TYLENOL) 500 MG tablet Take 1,000 mg by mouth at bedtime as needed.   Yes [provider]  acetaminophen-codeine (TYLENOL #3) 300-30 MG tablet Take 0.5-1 tablets by mouth every 8 (eight) hours as needed for moderate pain or severe pain (Do not consume greater than 2000 mg acetiminophen within 24 hours). 04/05/17  Yes [name], FNP  albuterol (PROVENTIL) (2.5 MG/3ML) 0.083% nebulizer solution Take 3 mLs (2.5 mg total) by nebulization every 6 (six) hours as needed for wheezing or shortness of breath. 02/22/17  Yes [name], FNP  albuterol (VENTOLIN HFA) 108 (90 Base) MCG/ACT inhaler Inhale 2 puffs into the lungs every 6 (six) hours as needed for wheezing or shortness of breath. 10/26/16  Yes Jegede, Olugbemiga E, MD  benzonatate (TESSALON) 100 MG capsule TAKE 1-2 CAPSULES BY MOUTH 3 TIMES DAILY AS NEEDED FOR COUGH. 03/01/17  Yes [name], FNP  budesonide (PULMICORT) 0.25 MG/2ML nebulizer solution Take 2 mLs (0.25 mg total) by nebulization 2 (two) times daily. 02/22/17  Yes [name], FNP  budesonide-formoterol (SYMBICORT) 80-4.5 MCG/ACT inhaler INHALE 2 PUFFS INTO THE  LUNGS 2 TIMES DAILY. 01/01/17  Yes [name], FNP  carvedilol (COREG) 12.5 MG tablet Take 1 tablet (12.5 mg total) by mouth 2 (two) times daily with a meal. 03/13/17  Yes Chilton Si, MD  cetirizine (ZYRTEC) 10 MG tablet Take 1 tablet (10 mg total) by mouth daily. 06/21/16  Yes [name], FNP  fluticasone (FLONASE) 50 MCG/ACT nasal spray Place 2 sprays into both nostrils daily. 02/22/17  Yes [name], FNP  furosemide (LASIX) 40 MG tablet Take 1 tablet (40 mg total) by mouth daily. 1 tablet by mouth daily and as needed 03/05/17  Yes [name], FNP  losartan (COZAAR) 50 MG tablet TAKE 1 TABLET BY MOUTH DAILY. 03/04/17  Yes Chilton Si, MD  meloxicam (MOBIC) 15 MG tablet TAKE 1 TABLET DAILY BY MOUTH. 03/01/17  Yes [name], FNP  Potassium Chloride ER 20 MEQ TBCR TAKE 1 TABLET BY MOUTH 2 TIMES DAILY. 03/04/17  Yes Chilton Si, MD  potassium chloride SA (K-DUR,KLOR-CON) 20 MEQ tablet Take 1 tablet (20 mEq total) by mouth 2 (two) times daily. Take with each dose of lasix. 02/23/17  Yes [name], FNP  predniSONE (DELTASONE) 20 MG tablet Take 2 tablets (40 mg total) by mouth daily with breakfast. 04/05/17  Yes [name], FNP  PROAIR HFA 108 [phone] Base) MCG/ACT inhaler INHALE 2 PUFFS INTO THE LUNGS EVERY 6 (SIX) HOURS AS NEEDED FOR WHEEZING OR SHORTNESS OF BREATH. 04/09/17  Yes [name], FNP    Past Medical, Surgical Family and Social History reviewed and updated.    Objective:   Today's Vitals   04/10/17 1302  BP: 118/66  Pulse: 80  Resp: 14  Temp: 98 F (36.7 C)  TempSrc: Oral  SpO2: 95%  Weight: (!) 338 lb (153.3 kg)  Height: 5' 3.5" (1.613 m)    Wt Readings from Last 3 Encounters:  04/10/17 (!) 338 lb (153.3 kg)  04/05/17 (!) 340 lb (154.2 kg)  03/20/17 (!) 330 lb (149.7 kg)   Physical Exam HENT: Head:Normocephalicand atraumatic.  Eyes:Conjunctivaeand EOMare normal. Pupils are equal, round, and  reactive to light.  Neck:Normal range of motion.Neck supple.No thyromegalypresent.  Cardiovascular:Normal rate,regular rhythm,normal heart soundsand intact distal pulses.  Pulmonary/Chest:Effort normal. Norespiratory distress. She hasdecreased breath sounds. She has wheezes. She hasrhonchi. She hasno rales.Musculoskeletal:Normal range of motion.  Lymphadenopathy: She has no cervical adenopathy.  Neurological: She isalertand oriented to person, place, and time.  Skin: Skin iswarmand dry. Psychiatric: She has anormal mood and affect. Herbehavior is normal.  Assessment & Plan:  1. Shortness of breath 2. Bronchitis 3. Wheezing Start broad-spectrum antibiotic to treated bronchitis. Will order a dose of  solumedrol 80 mg once here in office and duo-nebulizer treatment. Increase home oxygen 3 liters as needed for shortness of breath.   Meds ordered this encounter  Medications  . amoxicillin-clavulanate (AUGMENTIN) 875-125 MG tablet  Sig: Take 1 tablet by mouth 2 (two) times daily.    Dispense:  20 tablet    Refill:  0    Order Specific Question:   Supervising Provider    Answer:   Quentin Angst [id number]  . predniSONE (DELTASONE) 20 MG tablet    Sig: Take 1 tablet (20 mg total) by mouth daily with breakfast.    Dispense:  5 tablet    Refill:  0    Order Specific Question:   Supervising Provider    Answer:   Quentin Angst [id number]  . albuterol (PROVENTIL HFA;VENTOLIN HFA) 108 (90 Base) MCG/ACT inhaler    Sig: Inhale 2 puffs into the lungs every 4 (four) hours as needed for wheezing or shortness of breath (cough, shortness of breath or wheezing.).    Dispense:  1 Inhaler    Refill:  3    Patient is completely out will pick-up today    Order Specific Question:   Supervising Provider    Answer:   Quentin Angst [id number]  . DISCONTD: ipratropium-albuterol (DUONEB) 0.5-2.5 (3) MG/3ML nebulizer solution 3 mL  . methylPREDNISolone sodium succinate  (SOLU-MEDROL) 125 mg/2 mL injection 80 mg  . fluconazole (DIFLUCAN) 150 MG tablet    Sig: Take 1 tablet (150 mg total) by mouth once for 1 dose.    Dispense:  1 tablet    Refill:  0    Order Specific Question:   Supervising Provider    Answer:   Quentin Angst [id number]  . ipratropium (ATROVENT) nebulizer solution 0.5 mg  . albuterol (PROVENTIL) (2.5 MG/3ML) 0.083% nebulizer solution 2.5 mg   RTC: 10 days to re-evaluate symptoms of dyspnea.    [name]. Tiburcio Pea, MSN, FNP-C [facility]  [address] [place] [place], [place] [address] [phone]

## 2017-04-10 NOTE — Patient Instructions (Addendum)
Contact [place] at [facility] regarding nebulizer machine. There address is [address] [place], [place] [address].   I am continuing on prednisone 20 mg x 5 days with meals.  Adding antibiotic treatment with Augmentin 1 tablet twice daily with meals. Also prescribing Diflucan in the event you develop vaginal irritation.  Start administering albuterol inhaler 2 puffs every 4 hours as needed for shortness of breath and cough    Acute Bronchitis, Adult Acute bronchitis is when air tubes (bronchi) in the lungs suddenly get swollen. The condition can make it hard to breathe. It can also cause these symptoms:  A cough.  Coughing up clear, yellow, or green mucus.  Wheezing.  Chest congestion.  Shortness of breath.  A fever.  Body aches.  Chills.  A sore throat.  Follow these instructions at home: Medicines  Take over-the-counter and prescription medicines only as told by your doctor.  If you were prescribed an antibiotic medicine, take it as told by your doctor. Do not stop taking the antibiotic even if you start to feel better. General instructions  Rest.  Drink enough fluids to keep your pee (urine) clear or pale yellow.  Avoid smoking and secondhand smoke. If you smoke and you need help quitting, ask your doctor. Quitting will help your lungs heal faster.  Use an inhaler, cool mist vaporizer, or humidifier as told by your doctor.  Keep all follow-up visits as told by your doctor. This is important. How is this prevented? To lower your risk of getting this condition again:  Wash your hands often with soap and water. If you cannot use soap and water, use hand sanitizer.  Avoid contact with people who have cold symptoms.  Try not to touch your hands to your mouth, nose, or eyes.  Make sure to get the flu shot every year.  Contact a doctor if:  Your symptoms do not get better in 2 weeks. Get help right away if:  You cough up blood.  You have chest  pain.  You have very bad shortness of breath.  You become dehydrated.  You faint (pass out) or keep feeling like you are going to pass out.  You keep throwing up (vomiting).  You have a very bad headache.  Your fever or chills gets worse. This information is not intended to replace advice given to you by your health care provider. Make sure you discuss any questions you have with your health care provider. Document Released: 08/01/2007 Document Revised: 09/21/2015 Document Reviewed: 08/03/2015 Elsevier Interactive Patient Education  [employer].

## 2017-04-14 ENCOUNTER — Encounter (HOSPITAL_BASED_OUTPATIENT_CLINIC_OR_DEPARTMENT_OTHER): Payer: Medicaid Other

## 2017-04-15 MED FILL — POTASSIUM CL ER 20 MEQ TAB: 20 | 30 days supply | Qty: 60 | Fill #1

## 2017-04-17 MED FILL — FUROSEMIDE 40 MG TAB: 40 | 30 days supply | Qty: 60 | Fill #0

## 2017-04-21 ENCOUNTER — Ambulatory Visit (HOSPITAL_BASED_OUTPATIENT_CLINIC_OR_DEPARTMENT_OTHER): Payer: Medicaid Other | Attending: Family Medicine | Admitting: Internal Medicine

## 2017-04-21 VITALS — Ht 63.5 in | Wt 333.0 lb

## 2017-04-21 DIAGNOSIS — G4736 Sleep related hypoventilation in conditions classified elsewhere: Secondary | ICD-10-CM | POA: Insufficient documentation

## 2017-04-21 DIAGNOSIS — G4733 Obstructive sleep apnea (adult) (pediatric): Secondary | ICD-10-CM

## 2017-04-21 DIAGNOSIS — Z79899 Other long term (current) drug therapy: Secondary | ICD-10-CM | POA: Insufficient documentation

## 2017-04-21 DIAGNOSIS — Z7951 Long term (current) use of inhaled steroids: Secondary | ICD-10-CM | POA: Insufficient documentation

## 2017-04-24 ENCOUNTER — Ambulatory Visit (INDEPENDENT_AMBULATORY_CARE_PROVIDER_SITE_OTHER): Payer: Medicaid Other | Admitting: Family Medicine

## 2017-04-24 ENCOUNTER — Encounter: Payer: Self-pay | Admitting: Family Medicine

## 2017-04-24 VITALS — BP 114/76 | HR 88 | Temp 97.6°F | Ht 63.5 in | Wt 336.0 lb

## 2017-04-24 DIAGNOSIS — J45901 Unspecified asthma with (acute) exacerbation: Secondary | ICD-10-CM | POA: Diagnosis not present

## 2017-04-24 DIAGNOSIS — I272 Pulmonary hypertension, unspecified: Secondary | ICD-10-CM

## 2017-04-24 NOTE — Progress Notes (Signed)
Patient ID: Jennifer Owens, female    DOB: 07-02-1970, 47 y.o.   MRN: [id number]  PCP: [name], FNP  Chief Complaint  Patient presents with  . Follow-up    sob, wheezing    Subjective:  HPI Jennifer Owens is a 47 y.o. female with severe asthma, pulmonary hypertension, morbid obesity, presents for follow-up of recent asthma exacerbation and acute bronchitis. [name] is prescribed continuous daily 2 liters of oxygen and during recent bronchitis illness, she developed worsening dyspnea and requred increased oxygen demand.  She was placed on antibiotic therapy and dose of Lasix was increased during her last office visit on 04/10/2017.  She reports today that she is feeling 100% better and able to ambulate and perform routine daily activities without experiencing shortness of breath beyond her baseline. She has not used her short-acting inhaler within the last few says.  Jennifer Owens is scheduled to attend a CPAP titration study as she was recently diagnosed with obstructive sleep apnea in order to process the prescription for her CPAP machine.  She denies any chest tightness, wet productive coughing, wheezing or worsening edema. Social History   Socioeconomic History  . Marital status: Legally Separated    Spouse name: Not on file  . Number of children: Not on file  . Years of education: Not on file  . Highest education level: Not on file  Social Needs  . Financial resource strain: Not on file  . Food insecurity - worry: Not on file  . Food insecurity - inability: Not on file  . Transportation needs - medical: Not on file  . Transportation needs - non-medical: Not on file  Occupational History  . Occupation: CNA for [employer]  Tobacco Use  . Smoking status: Former Smoker    Packs/day: 0.25    Years: 10.00    Pack years: 2.50    Types: Cigarettes    Last attempt to quit: 07/2015    Years since quitting: 1.7  . Smokeless tobacco: Never Used  Substance and Sexual Activity   . Alcohol use: No    Alcohol/week: 0.6 oz    Types: 1 Standard drinks or equivalent per week    Comment: very rarely - 1-2 drinks/month  . Drug use: No  . Sexual activity: Not on file  Other Topics Concern  . Not on file  Social History Narrative  . Not on file    Family History  Problem Relation Age of Onset  . Diabetes Mellitus II Mother   . Hypertension Mother   . Diabetes Mellitus II Father    Review of Systems Pertinent negatives indicated in HPI. Patient Active Problem List   Diagnosis Date Noted  . Obstructive sleep apnea 02/12/2017  . NICM (nonischemic cardiomyopathy) (HCC) 09/06/2015  . Pulmonary hypertension (HCC)   . Chronic combined systolic and diastolic heart failure (HCC)   . Morbid obesity (HCC)   . Hypertensive heart disease 08/17/2015  . Hypoxia 07/22/2015  . Asthma 07/22/2015  . Thrombocytopenia (HCC) 07/22/2015  . Tobacco abuse 07/22/2015    No Known Allergies  Prior to Admission medications   Medication Sig Start Date End Date Taking? Authorizing Provider  acetaminophen (TYLENOL) 500 MG tablet Take 1,000 mg by mouth at bedtime as needed.   Yes [provider]  acetaminophen-codeine (TYLENOL #3) 300-30 MG tablet Take 0.5-1 tablets by mouth every 8 (eight) hours as needed for moderate pain or severe pain (Do not consume greater than 2000 mg acetiminophen within 24  hours). 04/05/17  Yes [name], FNP  albuterol (PROVENTIL) (2.5 MG/3ML) 0.083% nebulizer solution Take 3 mLs (2.5 mg total) by nebulization every 6 (six) hours as needed for wheezing or shortness of breath. 02/22/17  Yes [name], FNP  albuterol (VENTOLIN HFA) 108 (90 Base) MCG/ACT inhaler Inhale 2 puffs into the lungs every 6 (six) hours as needed for wheezing or shortness of breath. 10/26/16  Yes Quentin Angst, MD  amoxicillin-clavulanate (AUGMENTIN) 875-125 MG tablet Take 1 tablet by mouth 2 (two) times daily. 04/10/17  Yes [name], FNP   benzonatate (TESSALON) 100 MG capsule TAKE 1-2 CAPSULES BY MOUTH 3 TIMES DAILY AS NEEDED FOR COUGH. 03/01/17  Yes [name], FNP  budesonide (PULMICORT) 0.25 MG/2ML nebulizer solution Take 2 mLs (0.25 mg total) by nebulization 2 (two) times daily. 02/22/17  Yes [name], FNP  budesonide-formoterol (SYMBICORT) 80-4.5 MCG/ACT inhaler INHALE 2 PUFFS INTO THE LUNGS 2 TIMES DAILY. 01/01/17  Yes [name], FNP  carvedilol (COREG) 12.5 MG tablet Take 1 tablet (12.5 mg total) by mouth 2 (two) times daily with a meal. 03/13/17  Yes Chilton Si, MD  cetirizine (ZYRTEC) 10 MG tablet Take 1 tablet (10 mg total) by mouth daily. 06/21/16  Yes [name], FNP  fluticasone (FLONASE) 50 MCG/ACT nasal spray Place 2 sprays into both nostrils daily. 02/22/17  Yes [name], FNP  furosemide (LASIX) 40 MG tablet Take 1 tablet (40 mg total) by mouth daily. 1 tablet by mouth daily and as needed 03/05/17  Yes [name], FNP  losartan (COZAAR) 50 MG tablet TAKE 1 TABLET BY MOUTH DAILY. 03/04/17  Yes Chilton Si, MD  meloxicam (MOBIC) 15 MG tablet TAKE 1 TABLET DAILY BY MOUTH. 03/01/17  Yes [name], FNP  Potassium Chloride ER 20 MEQ TBCR TAKE 1 TABLET BY MOUTH 2 TIMES DAILY. 03/04/17  Yes Chilton Si, MD  potassium chloride SA (K-DUR,KLOR-CON) 20 MEQ tablet Take 1 tablet (20 mEq total) by mouth 2 (two) times daily. Take with each dose of lasix. 02/23/17  Yes [name], FNP  PROAIR HFA 108 [phone] Base) MCG/ACT inhaler INHALE 2 PUFFS INTO THE LUNGS EVERY 6 (SIX) HOURS AS NEEDED FOR WHEEZING OR SHORTNESS OF BREATH. 04/09/17  Yes [name], FNP  albuterol (PROVENTIL HFA;VENTOLIN HFA) 108 (90 Base) MCG/ACT inhaler Inhale 2 puffs into the lungs every 4 (four) hours as needed for wheezing or shortness of breath (cough, shortness of breath or wheezing.). 04/10/17   [name], FNP  predniSONE (DELTASONE) 20 MG tablet Take 1 tablet (20 mg total)  by mouth daily with breakfast. 04/10/17   [name], FNP    Past Medical, Surgical Family and Social History reviewed and updated.    Objective:   Today's Vitals   04/24/17 1357  BP: 114/76  Pulse: 88  Temp: 97.6 F (36.4 C)  TempSrc: Oral  SpO2: 95%  Weight: (!) 336 lb (152.4 kg)  Height: 5' 3.5" (1.613 m)    Wt Readings from Last 3 Encounters:  04/24/17 (!) 336 lb (152.4 kg)  04/21/17 (!) 333 lb (151 kg)  04/10/17 (!) 338 lb (153.3 kg)    Physical Exam  Constitutional: She appears well-developed and well-nourished.  HENT:  Head: Normocephalic.  Eyes: Conjunctivae and EOM are normal. Pupils are equal, round, and reactive to light.  Neck: Normal range of motion. Neck supple.  Cardiovascular: Normal rate, regular rhythm, normal heart sounds and intact  distal pulses.  Pulmonary/Chest: She has decreased breath sounds. She has no wheezes. She has no rhonchi. She has no rales.  Musculoskeletal:  Trace BLE edema present-consistent with baseline  Lymphadenopathy:    She has no cervical adenopathy.  Skin: Skin is warm and dry.  Psychiatric: She has a normal mood and affect. Her behavior is normal. Judgment and thought content normal.   Assessment & Plan:  1. Persistent asthma with acute exacerbation, unspecified asthma severity 2. Pulmonary HTN (HCC) Dreana appears well and stable today.  She is breathing with normal effort today.  Lung sounds have improved although remain diminished which is in accordance with her normal baseline.  She is encouraged to continue 2 L of oxygen via nasal cannula as prescribed.  Continue to use albuterol inhaler as needed 2 puffs every 4-6 hours.  Reemphasized the importance of utilizing [place] therapy with Symbicort regardless of symptoms 2 puff twice daily.  Patient verbalized understanding.   RTC: 3 months for chronic condition follow-up or sooner if needed.   [name]. Tiburcio Pea, MSN, FNP-C [facility]  [address] [place] [place] [place], [place] [address] [phone]

## 2017-04-30 DIAGNOSIS — G4733 Obstructive sleep apnea (adult) (pediatric): Secondary | ICD-10-CM

## 2017-04-30 NOTE — Procedures (Signed)
Patient Name: Jennifer Owens, Jennifer Owens Date: 04/21/2017 Gender: Female D.O.B: 29-Apr-1970 Age (years): 46 Referring Provider: Joaquin Courts FNP Height (inches): 64 Interpreting Physician: Jetty Duhamel MD, ABSM Weight (lbs): 330 RPSGT: Lowry Ram BMI: 58 MRN: [id number] Neck Size: 18.50 CLINICAL INFORMATION The patient is referred for a BiPAP titration to treat sleep apnea.  Date of NPSG, Split Night or HST: 03/20/17   AHI 72,6/ hr, body weight 330 lbs  SLEEP STUDY TECHNIQUE As per the AASM Manual for the Scoring of Sleep and Associated Events v2.3 (April 2016) with a hypopnea requiring 4% desaturations.  The channels recorded and monitored were frontal, central and occipital EEG, electrooculogram (EOG), submentalis EMG (chin), nasal and oral airflow, thoracic and abdominal wall motion, anterior tibialis EMG, snore microphone, electrocardiogram, and pulse oximetry. Bilevel positive airway pressure (BPAP) was initiated at the beginning of the study and titrated to treat sleep-disordered breathing.  MEDICATIONS Medications self-administered by patient taken the night of the study : TYLENOL, CARVEDILOL, POTASSIUM CHLORIDE, SYMBICORT, ALBUTEROL  RESPIRATORY PARAMETERS Optimal IPAP Pressure (cm): 20 AHI at Optimal Pressure (/hr) 0.0 Optimal EPAP Pressure (cm): 16   Overall Minimal O2 (%): 70.0 Minimal O2 at Optimal Pressure (%): 85.0 SLEEP ARCHITECTURE Start Time: 10:47:38 PM Stop Time: 5:36:59 AM Total Time (min): 409.3 Total Sleep Time (min): 375.0 Sleep Latency (min): 11.5 Sleep Efficiency (%): 91.6% REM Latency (min): 34.5 WASO (min): 22.9 Stage N1 (%): 9.6% Stage N2 (%): 70.4% Stage N3 (%): 0.0% Stage R (%): 20.00 Supine (%): 19.67 Arousal Index (/hr): 23.4   CARDIAC DATA The 2 lead EKG demonstrated sinus rhythm. The mean heart rate was 74.9 beats per minute. Other EKG findings include: PVCs.  LEG MOVEMENT DATA The total Periodic Limb Movements of Sleep (PLMS) were 0. The  PLMS index was 0.0. A PLMS index of <15 is considered normal in adults.  IMPRESSIONS - CPAP did not provide adequate control at tolerated pressures and was converted to BIPAP. Optimal BIPAP was Insp 20 / Exp 16, AHI 0/ hr. - Central sleep apnea was not noted during this titration (CAI = 0.0/h). - Severe oxygen desaturations were observed during this titration (min O2 = 70.0%). Supplemental; O2 was added at 1L/min, but at BIPAP 20/16, with O2 1L, Mean saturation was still 88.8% with 13.6 minutes still below 88% at that presssure. - The patient snored with soft snoring volume. - 2-lead EKG demonstrated: PVCs - Clinically significant periodic limb movements were not noted during this study. Arousals associated with PLMs were rare.  DIAGNOSIS - Obstructive Sleep Apnea (327.23 [G47.33 ICD-10]) - Nocturnal Hypoxemia  RECOMMENDATIONS - Trial of BiPAP therapy on 20/16 cm H2O with a Small size Resmed Full Face Mask AirFit F20 mask and heated humidification. - Supplemental; O2 at 2L/ min.  - Be careful with alcohol, sedatives and other CNS depressants that may worsen sleep apnea and disrupt normal sleep architecture. - Sleep hygiene should be reviewed to assess factors that may improve sleep quality. - Weight management and regular exercise should be initiated or continued.  [Electronically signed] 04/30/2017 03:22 PM  Jetty Duhamel MD, ABSM Diplomate, American Board of Sleep Medicine   NPI: [id number]                          Jetty Duhamel Diplomate, American Board of Sleep Medicine  ELECTRONICALLY SIGNED ON:  04/30/2017, 3:17 PM [facility] PH: [phone]   FX: [phone] ACCREDITED BY THE AMERICAN ACADEMY OF  SLEEP MEDICINE

## 2017-05-03 ENCOUNTER — Telehealth: Payer: Self-pay | Admitting: Family Medicine

## 2017-05-03 NOTE — Telephone Encounter (Signed)
Completed order for BIPAP machine in accordance with recent sleep study results.     [name]. Tiburcio Pea, MSN, FNP-C [facility]  [address] [place] [place], [place] [address] [phone]

## 2017-05-03 NOTE — Telephone Encounter (Signed)
Formed faxed to sleep center on 05/03/2017

## 2017-05-07 MED FILL — LOSARTAN POTASSIUM 50 MG TA: 50 | 30 days supply | Qty: 30 | Fill #2

## 2017-05-10 MED FILL — POTASSIUM CL ER 20 MEQ TAB: 20 | 30 days supply | Qty: 60 | Fill #2

## 2017-05-10 MED FILL — ?CETIRIZINE HCL 10 MG TABLE: 10 | 30 days supply | Qty: 30 | Fill #3

## 2017-05-13 MED FILL — ALBUTEROL 0.083% INHAL SOLN: (2.5 MG/3ML | 7 days supply | Qty: 90 | Fill #1

## 2017-05-13 MED FILL — SYMBICORT 80-4.5 MCG INH: 80-4.5 | 30 days supply | Qty: 10 | Fill #3

## 2017-05-30 ENCOUNTER — Telehealth: Payer: Self-pay

## 2017-05-30 NOTE — Telephone Encounter (Signed)
Tried to contact patient no vm set up 

## 2017-05-30 NOTE — Telephone Encounter (Signed)
Patient states that her foot is swelling up really bad and that she double up on Lasix and it's not helping. Patient states that this is been happening for a week. She is currently walking on crutches and would like to know if anti inflammatory can be send in. Patient was advise that she may need to be seen in office.

## 2017-05-30 NOTE — Telephone Encounter (Signed)
She will need to go to the ED for further evaluation immediately as this issue has been going on for over 1 week and this could like be related to her heart failure. I do not have any available appointment and an antiinflammatory would not appropriate if she is experiencing fluid retention.   [name]. Tiburcio Pea, MSN, FNP-C [facility]  [address] [place] [place], [place] [address] [phone]

## 2017-05-31 MED FILL — FUROSEMIDE 40 MG TAB: 40 | 30 days supply | Qty: 60 | Fill #1

## 2017-05-31 NOTE — Telephone Encounter (Signed)
Tried to contact patient no vm

## 2017-06-03 NOTE — Telephone Encounter (Signed)
Tried to contact patient no answer 

## 2017-06-03 NOTE — Telephone Encounter (Signed)
Tired to contact patient no answer 

## 2017-06-04 MED FILL — LOSARTAN POTASSIUM 50 MG TA: 50 | 30 days supply | Qty: 30 | Fill #3

## 2017-06-04 MED FILL — PROAIR HFA 90 MCG INHALER: 108 (90 BAS | 16 days supply | Qty: 9 | Fill #1

## 2017-06-04 MED FILL — POTASSIUM CL ER 20 MEQ TAB: 20 | 30 days supply | Qty: 60 | Fill #3

## 2017-06-04 NOTE — Telephone Encounter (Signed)
Have been unable to reach patient.  ?

## 2017-06-11 ENCOUNTER — Inpatient Hospital Stay (HOSPITAL_BASED_OUTPATIENT_CLINIC_OR_DEPARTMENT_OTHER): Payer: Medicaid Other | Admitting: Hematology and Oncology

## 2017-06-11 ENCOUNTER — Inpatient Hospital Stay: Payer: Medicaid Other | Attending: Hematology and Oncology

## 2017-06-11 DIAGNOSIS — D696 Thrombocytopenia, unspecified: Secondary | ICD-10-CM

## 2017-06-11 DIAGNOSIS — D693 Immune thrombocytopenic purpura: Secondary | ICD-10-CM | POA: Diagnosis not present

## 2017-06-11 LAB — CBC WITH DIFFERENTIAL (CANCER CENTER ONLY)
BASOS ABS: 0 10*3/uL (ref 0.0–0.1)
Basophils Relative: 0 %
Eosinophils Absolute: 0.2 10*3/uL (ref 0.0–0.5)
Eosinophils Relative: 4 %
HEMATOCRIT: 47 % — AB (ref 34.8–46.6)
HEMOGLOBIN: 14.5 g/dL (ref 11.6–15.9)
LYMPHS PCT: 33 %
Lymphs Abs: 1.8 10*3/uL (ref 0.9–3.3)
MCH: 31.6 pg (ref 25.1–34.0)
MCHC: 30.9 g/dL — AB (ref 31.5–36.0)
MCV: 102.4 fL — AB (ref 79.5–101.0)
MONO ABS: 0.7 10*3/uL (ref 0.1–0.9)
MONOS PCT: 13 %
NEUTROS PCT: 50 %
Neutro Abs: 2.8 10*3/uL (ref 1.5–6.5)
Platelet Count: 32 10*3/uL — ABNORMAL LOW (ref 145–400)
RBC: 4.59 MIL/uL (ref 3.70–5.45)
RDW: 13.2 % (ref 11.2–14.5)
WBC Count: 5.5 10*3/uL (ref 3.9–10.3)

## 2017-06-11 NOTE — Assessment & Plan Note (Signed)
Platelet counts ranged from 30 on 05/07/2014 to 59 December 2018 During June and July 2017 hospitalization platelet counts were between 45-106 The rest of the CBC and differential are normal 03/20/2017: Platelet count 77  Differential diagnosis: 1. Low-grade ITP 2. medication induced: On further review patient is not taking any medications that are associated with thrombocytopenia 3. Bone marrow factors 4. Hepatitis B/C: Negative 5. Splenomegaly: Could not palpate any enlarged spleen due to obesity.  It is possible that the patient could have fatty liver and splenomegaly as result of that.  I recommended ultrasound evaluation for spleen Previous HIV testing was negative    As long as the patient is not bleeding and platelet counts remain over 50 we do not have to treat ITP.  If her platelet count continues to decline then we may have to do a bone marrow biopsy.  We will see her back in 6 months with labs and follow-up

## 2017-06-11 NOTE — Progress Notes (Signed)
Patient Care Team: Scot Jun, FNP as PCP - General (Family Medicine)  DIAGNOSIS:  Encounter Diagnoses  Name Primary?  . Thrombocytopenia ([place])   . 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  She does not report any complaints of easy bruising or bleeding.  Initial workup did not show any evidence of hepatitis B or C.  Abdominal ultrasound showed normal-appearing spleen and fatty infiltration of the liver.  Previously HIV was negative.  It appears that the patient was taking prednisone therapy for an upper respiratory infection when her platelet counts jumped up to 77.  Since she stopped the prednisone the platelets are down to 32 today.  This is suggestive of acute ITP.  REVIEW OF SYSTEMS:   Constitutional: Denies fevers, chills or abnormal weight loss Eyes: Denies blurriness of vision Ears, nose, mouth, throat, and face: Denies mucositis or sore throat Respiratory: Denies cough, dyspnea or wheezes Cardiovascular: Denies palpitation, chest discomfort Gastrointestinal:  Denies nausea, heartburn or change in bowel habits Skin: Denies abnormal skin rashes Lymphatics: Denies new lymphadenopathy or easy bruising Neurological:Denies numbness, tingling or new weaknesses Behavioral/Psych: Mood is stable, no new changes  Extremities: No lower extremity edema  All other systems were reviewed with the patient and are negative.  I have reviewed the past medical history, past surgical history, social history and family history with the patient and they are unchanged from previous note.  ALLERGIES:  has No Known Allergies.  MEDICATIONS:  Current Outpatient Medications  Medication Sig Dispense Refill  . acetaminophen (TYLENOL) 500 MG tablet Take  1,000 mg by mouth at bedtime as needed.    .Marland Kitchenacetaminophen-codeine (TYLENOL #3) 300-30 MG tablet Take 0.5-1 tablets by mouth every 8 (eight) hours as needed for moderate pain or severe pain (Do not consume greater than 2000 mg acetiminophen within 24 hours). 30 tablet 0  . albuterol (PROVENTIL HFA;VENTOLIN HFA) 108 (90 Base) MCG/ACT inhaler Inhale 2 puffs into the lungs every 4 (four) hours as needed for wheezing or shortness of breath (cough, shortness of breath or wheezing.). 1 Inhaler 3  . albuterol (PROVENTIL) (2.5 MG/3ML) 0.083% nebulizer solution Take 3 mLs (2.5 mg total) by nebulization every 6 (six) hours as needed for wheezing or shortness of breath. 150 mL 1  . albuterol (VENTOLIN HFA) 108 (90 Base) MCG/ACT inhaler Inhale 2 puffs into the lungs every 6 (six) hours as needed for wheezing or shortness of breath. 54 g 3  . benzonatate (TESSALON) 100 MG capsule TAKE 1-2 CAPSULES BY MOUTH 3 TIMES DAILY AS NEEDED FOR COUGH. 60 capsule 0  . budesonide (PULMICORT) 0.25 MG/2ML nebulizer solution Take 2 mLs (0.25 mg total) by nebulization 2 (two) times daily. 60 mL 12  . budesonide-formoterol (SYMBICORT) 80-4.5 MCG/ACT inhaler INHALE 2 PUFFS INTO THE LUNGS 2 TIMES DAILY. 10.2 g 3  . carvedilol (COREG) 12.5 MG tablet Take 1 tablet (12.5 mg total) by mouth 2 (two) times daily with a meal. 180 tablet 1  . cetirizine (ZYRTEC) 10 MG tablet Take 1 tablet (10 mg total) by mouth daily. 30 tablet 11  . fluticasone (FLONASE) 50 MCG/ACT nasal spray Place 2 sprays into both nostrils daily. 16 g 6  . furosemide (LASIX) 40 MG tablet Take 1 tablet (40 mg total) by mouth daily. 1  tablet by mouth daily and as needed 60 tablet 3  . losartan (COZAAR) 50 MG tablet TAKE 1 TABLET BY MOUTH DAILY. 90 tablet 1  . Potassium Chloride ER 20 MEQ TBCR TAKE 1 TABLET BY MOUTH 2 TIMES DAILY. 180 tablet 1  . potassium chloride SA (K-DUR,KLOR-CON) 20 MEQ tablet Take 1 tablet (20 mEq total) by mouth 2 (two) times daily. Take with each  dose of lasix. 180 tablet 3  . PROAIR HFA 108 (90 Base) MCG/ACT inhaler INHALE 2 PUFFS INTO THE LUNGS EVERY 6 (SIX) HOURS AS NEEDED FOR WHEEZING OR SHORTNESS OF BREATH. 8.5 g 0   No current facility-administered medications for this visit.     PHYSICAL EXAMINATION: ECOG PERFORMANCE STATUS: 1 - Symptomatic but completely ambulatory  Vitals:   06/11/17 1437  BP: 121/75  Pulse: 85  Resp: 19  Temp: 97.8 F (36.6 C)  SpO2: 91%   Filed Weights   06/11/17 1437  Weight: (!) 339 lb 4.8 oz (153.9 kg)    GENERAL:alert, no distress and comfortable SKIN: skin color, texture, turgor are normal, no rashes or significant lesions EYES: normal, Conjunctiva are pink and non-injected, sclera clear OROPHARYNX:no exudate, no erythema and lips, buccal mucosa, and tongue normal  NECK: supple, thyroid normal size, non-tender, without nodularity LYMPH:  no palpable lymphadenopathy in the cervical, axillary or inguinal LUNGS: clear to auscultation and percussion with normal breathing effort HEART: regular rate & rhythm and no murmurs and no lower extremity edema ABDOMEN:abdomen soft, non-tender and normal bowel sounds MUSCULOSKELETAL:no cyanosis of digits and no clubbing  NEURO: alert & oriented x 3 with fluent speech, no focal motor/sensory deficits EXTREMITIES: No lower extremity edema  LABORATORY DATA:  I have reviewed the data as listed CMP Latest Ref Rng & Units 02/12/2017 08/15/2016 06/21/2016  Glucose 65 - 99 mg/dL 80 87 93  BUN 6 - 24 mg/dL '18 16 15  '$ Creatinine 0.57 - 1.00 mg/dL 0.93 1.04(H) 0.98  Sodium 134 - 144 mmol/L 142 143 142  Potassium 3.5 - 5.2 mmol/L 4.7 4.7 4.5  Chloride 96 - 106 mmol/L 99 101 103  CO2 20 - 29 mmol/L 30(H) 27 29  Calcium 8.7 - 10.2 mg/dL 9.1 9.3 9.0  Total Protein 6.0 - 8.5 g/dL 7.7 - 6.9  Total Bilirubin 0.0 - 1.2 mg/dL 0.4 - 0.6  Alkaline Phos 39 - 117 IU/L 69 - 52  AST 0 - 40 IU/L 18 - 12  ALT 0 - 32 IU/L 16 - 10    Lab Results  Component Value Date    WBC 5.5 06/11/2017   HGB 12.5 02/22/2017   HCT 47.0 (H) 06/11/2017   MCV 102.4 (H) 06/11/2017   PLT 32 (L) 06/11/2017   NEUTROABS 2.8 06/11/2017    ASSESSMENT & PLAN:  Thrombocytopenia ([facility]) Platelet counts ranged from 30 on 05/07/2014 to 59 December 2018 During June and July 2017 hospitalization platelet counts were between 45-106 The rest of the CBC and differential are normal 03/20/2017: Platelet count 77 06/11/2017: Platelet count 32  Differential diagnosis: 1. Low-grade ITP 2. medication induced: On further review patient is not taking any medications that are associated with thrombocytopenia 3. Bone marrow factors 4. Hepatitis B/C: Negative 5. Splenomegaly: Could not palpate any enlarged spleen due to obesity. Abdominal ultrasound did not reveal any splenomegaly.  There was fatty liver.  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 I recommended  treatment with IVIG because of inability to tolerate steroid therapy for too long.  If the platelet count does not improve in spite of IVIG treatment, then we will have to consider doing a bone marrow biopsy. Return to clinic after IVIG is given to see if her CBC has improved.    Orders Placed This Encounter  Procedures  . CBC with Differential (Cancer Center Only)    Standing Status:   Future    Standing Expiration Date:   06/12/2018   The patient has a good understanding of the overall plan. she agrees with it. she will call with any problems that may develop before the next visit here.   Harriette Ohara, MD 06/11/17

## 2017-06-13 ENCOUNTER — Telehealth: Payer: Self-pay

## 2017-06-13 ENCOUNTER — Telehealth: Payer: Self-pay | Admitting: Hematology and Oncology

## 2017-06-13 NOTE — Telephone Encounter (Signed)
Called and spoke with patient to verify her appointments for 5 straight days. Per 4/18 phone message return

## 2017-06-13 NOTE — Telephone Encounter (Signed)
Spoke to patients cousin regarding upcoming April appointments.

## 2017-06-17 ENCOUNTER — Telehealth: Payer: Self-pay

## 2017-06-17 ENCOUNTER — Ambulatory Visit: Payer: Medicaid Other

## 2017-06-17 NOTE — Telephone Encounter (Signed)
Received VM from pt in regards to she would like to know if she can eat or take her morning medications prior to coming in for infusion.  Called pt back and realized she was scheduled for IVIG today at 9:30 am.  Pt reports she has been trying to reach someone since Saturday because she didn't know the process for receiving this infusion.  She said someone had spoke to a family member in regards to the days she was supposed to come this week but thought they were going to call back with explaining the process ie Can she eat? Can she take her morning meds? How long will I be there?  Do I need a ride home, will I be sleepy?  Apologized to pt that she didn't receive proper information.  Answered her questions, said it can take up to 5 hours - pt verbalized she is aware and was told possibly 7 hours; let her know she can eat breakfast and there are snacks here; she can take her meds that morning; made her aware to bring someone with her or have a ride home in case meds here could make her sleepy like Benadryl and come few minutes early to register.  Pt voiced understanding and she said she will be here tomorrow- reminded her of the other appts for the week and our location.  No other needs per pt at this time. Called Melanie RN infusion to let her know why pt was a no-show today but that she will be here tomorrow.

## 2017-06-18 ENCOUNTER — Inpatient Hospital Stay: Payer: Medicaid Other

## 2017-06-18 VITALS — BP 114/55 | HR 84 | Temp 99.4°F | Resp 22

## 2017-06-18 DIAGNOSIS — D693 Immune thrombocytopenic purpura: Secondary | ICD-10-CM

## 2017-06-18 MED ORDER — ACETAMINOPHEN 325 MG PO TABS
650.0000 mg | ORAL_TABLET | Freq: Once | ORAL | Status: AC
  Administered 2017-06-18: 650 mg via ORAL

## 2017-06-18 MED ORDER — SODIUM CHLORIDE 0.9 % IV SOLN
Freq: Once | INTRAVENOUS | Status: AC
  Administered 2017-06-18: 09:00:00 via INTRAVENOUS

## 2017-06-18 MED ORDER — ACETAMINOPHEN 325 MG PO TABS
ORAL_TABLET | ORAL | Status: AC
  Filled 2017-06-18: qty 2

## 2017-06-18 MED ORDER — DIPHENHYDRAMINE HCL 25 MG PO TABS
25.0000 mg | ORAL_TABLET | Freq: Once | ORAL | Status: AC
  Administered 2017-06-18: 25 mg via ORAL
  Filled 2017-06-18: qty 1

## 2017-06-18 MED ORDER — FAMOTIDINE IN NACL 20-0.9 MG/50ML-% IV SOLN
INTRAVENOUS | Status: AC
  Filled 2017-06-18: qty 50

## 2017-06-18 MED ORDER — DEXAMETHASONE SODIUM PHOSPHATE 10 MG/ML IJ SOLN
INTRAMUSCULAR | Status: AC
  Filled 2017-06-18: qty 1

## 2017-06-18 MED ORDER — DEXAMETHASONE SODIUM PHOSPHATE 10 MG/ML IJ SOLN
10.0000 mg | Freq: Once | INTRAMUSCULAR | Status: AC
  Administered 2017-06-18: 10 mg via INTRAVENOUS

## 2017-06-18 MED ORDER — SODIUM CHLORIDE 0.9 % IV SOLN: 10.0000 mg | Freq: Once | INTRAVENOUS | Status: DC

## 2017-06-18 MED ORDER — IMMUNE GLOBULIN (HUMAN) 20 GM/200ML IV SOLN
400.0000 mg/kg | INTRAVENOUS | Status: DC
  Administered 2017-06-18: 60 g via INTRAVENOUS
  Filled 2017-06-18: qty 600

## 2017-06-18 MED ORDER — DIPHENHYDRAMINE HCL 25 MG PO CAPS
ORAL_CAPSULE | ORAL | Status: AC
  Filled 2017-06-18: qty 1

## 2017-06-18 MED ORDER — FAMOTIDINE IN NACL 20-0.9 MG/50ML-% IV SOLN
20.0000 mg | Freq: Once | INTRAVENOUS | Status: AC
  Administered 2017-06-18: 20 mg via INTRAVENOUS

## 2017-06-18 NOTE — Patient Instructions (Signed)

## 2017-06-18 NOTE — Telephone Encounter (Signed)
Error opening  

## 2017-06-19 ENCOUNTER — Inpatient Hospital Stay: Payer: Medicaid Other

## 2017-06-19 ENCOUNTER — Other Ambulatory Visit: Payer: Self-pay | Admitting: *Deleted

## 2017-06-19 VITALS — BP 131/73 | HR 81 | Temp 98.4°F | Resp 20

## 2017-06-19 DIAGNOSIS — D693 Immune thrombocytopenic purpura: Secondary | ICD-10-CM

## 2017-06-19 MED ORDER — FAMOTIDINE IN NACL 20-0.9 MG/50ML-% IV SOLN
20.0000 mg | Freq: Once | INTRAVENOUS | Status: AC
  Administered 2017-06-19: 20 mg via INTRAVENOUS

## 2017-06-19 MED ORDER — IMMUNE GLOBULIN (HUMAN) 20 GM/200ML IV SOLN
60.0000 g | Freq: Once | INTRAVENOUS | Status: AC
  Administered 2017-06-19: 60 g via INTRAVENOUS
  Filled 2017-06-19: qty 600

## 2017-06-19 MED ORDER — DEXAMETHASONE SODIUM PHOSPHATE 10 MG/ML IJ SOLN
INTRAMUSCULAR | Status: AC
Start: 2017-06-19 — End: ?
  Filled 2017-06-19: qty 1

## 2017-06-19 MED ORDER — ACETAMINOPHEN 325 MG PO TABS
650.0000 mg | ORAL_TABLET | Freq: Once | ORAL | Status: AC
  Administered 2017-06-19: 650 mg via ORAL

## 2017-06-19 MED ORDER — DIPHENHYDRAMINE HCL 25 MG PO TABS
25.0000 mg | ORAL_TABLET | Freq: Once | ORAL | Status: AC
  Administered 2017-06-19: 25 mg via ORAL
  Filled 2017-06-19: qty 1

## 2017-06-19 MED ORDER — DEXAMETHASONE SODIUM PHOSPHATE 10 MG/ML IJ SOLN
10.0000 mg | Freq: Once | INTRAMUSCULAR | Status: AC
Start: 2017-06-19 — End: 2017-06-19
  Administered 2017-06-19: 10 mg via INTRAVENOUS

## 2017-06-19 MED ORDER — DIPHENHYDRAMINE HCL 25 MG PO CAPS
ORAL_CAPSULE | ORAL | Status: AC
Start: 2017-06-19 — End: ?
  Filled 2017-06-19: qty 1

## 2017-06-19 MED ORDER — FAMOTIDINE IN NACL 20-0.9 MG/50ML-% IV SOLN
INTRAVENOUS | Status: AC
  Filled 2017-06-19: qty 50

## 2017-06-19 MED ORDER — ACETAMINOPHEN 325 MG PO TABS
ORAL_TABLET | ORAL | Status: AC
  Filled 2017-06-19: qty 2

## 2017-06-19 NOTE — Patient Instructions (Signed)

## 2017-06-20 ENCOUNTER — Inpatient Hospital Stay: Payer: Medicaid Other

## 2017-06-20 VITALS — BP 141/88 | HR 73 | Temp 98.5°F | Resp 20

## 2017-06-20 DIAGNOSIS — D693 Immune thrombocytopenic purpura: Secondary | ICD-10-CM

## 2017-06-20 MED ORDER — DEXTROSE 5 % IV SOLN
INTRAVENOUS | Status: DC
  Administered 2017-06-20: 13:00:00 via INTRAVENOUS

## 2017-06-20 MED ORDER — FAMOTIDINE IN NACL 20-0.9 MG/50ML-% IV SOLN
INTRAVENOUS | Status: AC
  Filled 2017-06-20: qty 50

## 2017-06-20 MED ORDER — DEXAMETHASONE SODIUM PHOSPHATE 10 MG/ML IJ SOLN
10.0000 mg | Freq: Once | INTRAMUSCULAR | Status: AC
  Administered 2017-06-20: 10 mg via INTRAVENOUS

## 2017-06-20 MED ORDER — ACETAMINOPHEN 325 MG PO TABS
ORAL_TABLET | ORAL | Status: AC
  Filled 2017-06-20: qty 2

## 2017-06-20 MED ORDER — DEXAMETHASONE SODIUM PHOSPHATE 10 MG/ML IJ SOLN
INTRAMUSCULAR | Status: AC
  Filled 2017-06-20: qty 1

## 2017-06-20 MED ORDER — DIPHENHYDRAMINE HCL 25 MG PO TABS
25.0000 mg | ORAL_TABLET | Freq: Once | ORAL | Status: AC
  Administered 2017-06-20: 25 mg via ORAL
  Filled 2017-06-20: qty 1

## 2017-06-20 MED ORDER — DIPHENHYDRAMINE HCL 25 MG PO CAPS
ORAL_CAPSULE | ORAL | Status: AC
  Filled 2017-06-20: qty 1

## 2017-06-20 MED ORDER — IMMUNE GLOBULIN (HUMAN) 20 GM/200ML IV SOLN
60.0000 g | Freq: Once | INTRAVENOUS | Status: AC
  Administered 2017-06-20: 60 g via INTRAVENOUS
  Filled 2017-06-20: qty 600

## 2017-06-20 MED ORDER — ACETAMINOPHEN 325 MG PO TABS
650.0000 mg | ORAL_TABLET | Freq: Once | ORAL | Status: AC
  Administered 2017-06-20: 650 mg via ORAL

## 2017-06-20 MED ORDER — FAMOTIDINE IN NACL 20-0.9 MG/50ML-% IV SOLN
20.0000 mg | Freq: Once | INTRAVENOUS | Status: AC
  Administered 2017-06-20: 20 mg via INTRAVENOUS

## 2017-06-20 NOTE — Patient Instructions (Signed)

## 2017-06-21 ENCOUNTER — Inpatient Hospital Stay: Payer: Medicaid Other

## 2017-06-21 VITALS — BP 137/81 | HR 70 | Temp 97.5°F | Resp 20

## 2017-06-21 DIAGNOSIS — D693 Immune thrombocytopenic purpura: Secondary | ICD-10-CM

## 2017-06-21 MED ORDER — DIPHENHYDRAMINE HCL 25 MG PO TABS
25.0000 mg | ORAL_TABLET | Freq: Once | ORAL | Status: AC
  Administered 2017-06-21: 25 mg via ORAL
  Filled 2017-06-21: qty 1

## 2017-06-21 MED ORDER — FAMOTIDINE IN NACL 20-0.9 MG/50ML-% IV SOLN
INTRAVENOUS | Status: AC
  Filled 2017-06-21: qty 50

## 2017-06-21 MED ORDER — FAMOTIDINE IN NACL 20-0.9 MG/50ML-% IV SOLN
20.0000 mg | Freq: Once | INTRAVENOUS | Status: AC
  Administered 2017-06-21: 20 mg via INTRAVENOUS

## 2017-06-21 MED ORDER — ACETAMINOPHEN 325 MG PO TABS
650.0000 mg | ORAL_TABLET | Freq: Once | ORAL | Status: AC
  Administered 2017-06-21: 650 mg via ORAL

## 2017-06-21 MED ORDER — DIPHENHYDRAMINE HCL 25 MG PO CAPS
ORAL_CAPSULE | ORAL | Status: AC
  Filled 2017-06-21: qty 1

## 2017-06-21 MED ORDER — DEXAMETHASONE SODIUM PHOSPHATE 10 MG/ML IJ SOLN
INTRAMUSCULAR | Status: AC
  Filled 2017-06-21: qty 1

## 2017-06-21 MED ORDER — DEXAMETHASONE SODIUM PHOSPHATE 10 MG/ML IJ SOLN
10.0000 mg | Freq: Once | INTRAMUSCULAR | Status: AC
  Administered 2017-06-21: 10 mg via INTRAVENOUS

## 2017-06-21 MED ORDER — ACETAMINOPHEN 325 MG PO TABS
ORAL_TABLET | ORAL | Status: AC
  Filled 2017-06-21: qty 2

## 2017-06-21 MED ORDER — IMMUNE GLOBULIN (HUMAN) 20 GM/200ML IV SOLN
60.0000 g | Freq: Once | INTRAVENOUS | Status: AC
  Administered 2017-06-21: 60 g via INTRAVENOUS
  Filled 2017-06-21: qty 600

## 2017-06-24 ENCOUNTER — Telehealth: Payer: Self-pay | Admitting: Hematology and Oncology

## 2017-06-24 NOTE — Telephone Encounter (Signed)
Patient called to speak with nurse meds

## 2017-06-24 NOTE — Telephone Encounter (Signed)
Spoke to patient regarding upcoming May appointments per 4/26 sch message.

## 2017-07-01 ENCOUNTER — Telehealth: Payer: Self-pay | Admitting: Hematology and Oncology

## 2017-07-01 ENCOUNTER — Inpatient Hospital Stay (HOSPITAL_BASED_OUTPATIENT_CLINIC_OR_DEPARTMENT_OTHER): Payer: Self-pay | Admitting: Hematology and Oncology

## 2017-07-01 ENCOUNTER — Inpatient Hospital Stay: Payer: Self-pay | Attending: Hematology and Oncology

## 2017-07-01 VITALS — BP 156/98 | HR 94 | Temp 98.8°F | Resp 19 | Ht 63.25 in | Wt 336.5 lb

## 2017-07-01 DIAGNOSIS — D693 Immune thrombocytopenic purpura: Secondary | ICD-10-CM

## 2017-07-01 LAB — CBC WITH DIFFERENTIAL (CANCER CENTER ONLY)
BASOS PCT: 0 %
Basophils Absolute: 0 10*3/uL (ref 0.0–0.1)
EOS PCT: 1 %
Eosinophils Absolute: 0.1 10*3/uL (ref 0.0–0.5)
HCT: 42.9 % (ref 34.8–46.6)
Hemoglobin: 13.9 g/dL (ref 11.6–15.9)
LYMPHS ABS: 1.4 10*3/uL (ref 0.9–3.3)
Lymphocytes Relative: 20 %
MCH: 32 pg (ref 25.1–34.0)
MCHC: 32.3 g/dL (ref 31.5–36.0)
MCV: 98.9 fL (ref 79.5–101.0)
MONO ABS: 0.7 10*3/uL (ref 0.1–0.9)
MONOS PCT: 10 %
Neutro Abs: 5.2 10*3/uL (ref 1.5–6.5)
Neutrophils Relative %: 69 %
Platelet Count: 131 10*3/uL — ABNORMAL LOW (ref 145–400)
RBC: 4.34 MIL/uL (ref 3.70–5.45)
RDW: 14.1 % (ref 11.2–14.5)
WBC Count: 7.4 10*3/uL (ref 3.9–10.3)

## 2017-07-01 NOTE — Telephone Encounter (Signed)
Gave patient AVs and calendar of upcoming July appointments.  °

## 2017-07-01 NOTE — Assessment & Plan Note (Signed)
Platelet counts ranged from 30 on 05/07/2014 to 59 December 2018 During June and July 2017 hospitalization platelet counts were between 45-106 The rest of the CBC and differential are normal 03/20/2017: Platelet count 77 Lowest platelet count 33  Diagnosis: Acute ITP Treatment given: IVIG treatments 06/18/2017 Response to IVIG: Platelet count improved to 131.  Return to clinic in 2 months to recheck her blood counts and follow-up. I discussed with her that it is very possible that she may have a relapse with ITP. At the reason why we have to closely watch and monitor.

## 2017-07-01 NOTE — Progress Notes (Signed)
Patient Care Team: [name], FNP as PCP - General (Family Medicine)  DIAGNOSIS:  Encounter Diagnosis  Name Primary?  . Acute ITP ([facility]) Yes    SUMMARY OF ONCOLOGIC HISTORY: IVIG April 2019  CHIEF COMPLIANT: Follow-up to review blood work  INTERVAL HISTORY: Jennifer Owens is a 47 year old with above-mentioned history of ITP who received IVIG treatment and is here today to discuss the results of lab tests.  She tolerated IVIG extremely well.  REVIEW OF SYSTEMS:   Constitutional: Denies fevers, chills or abnormal weight loss Eyes: Denies blurriness of vision Ears, nose, mouth, throat, and face: Denies mucositis or sore throat Respiratory: Denies cough, dyspnea or wheezes Cardiovascular: Denies palpitation, chest discomfort Gastrointestinal:  Denies nausea, heartburn or change in bowel habits Skin: Denies abnormal skin rashes Lymphatics: Denies new lymphadenopathy or easy bruising Neurological:Denies numbness, tingling or new weaknesses Behavioral/Psych: Mood is stable, no new changes  Extremities: No lower extremity edema  All other systems were reviewed with the patient and are negative.  I have reviewed the past medical history, past surgical history, social history and family history with the patient and they are unchanged from previous note.  ALLERGIES:  has No Known Allergies.  MEDICATIONS:  Current Outpatient Medications  Medication Sig Dispense Refill  . acetaminophen (TYLENOL) 500 MG tablet Take 1,000 mg by mouth at bedtime as needed.    Marland Kitchen acetaminophen-codeine (TYLENOL #3) 300-30 MG tablet Take 0.5-1 tablets by mouth every 8 (eight) hours as needed for moderate pain or severe pain (Do not consume greater than 2000 mg acetiminophen within 24 hours). 30 tablet 0  . albuterol (PROVENTIL HFA;VENTOLIN HFA) 108 (90 Base) MCG/ACT inhaler Inhale 2 puffs into the lungs every 4 (four) hours as needed for wheezing or shortness of breath (cough, shortness of breath or  wheezing.). 1 Inhaler 3  . albuterol (PROVENTIL) (2.5 MG/3ML) 0.083% nebulizer solution Take 3 mLs (2.5 mg total) by nebulization every 6 (six) hours as needed for wheezing or shortness of breath. 150 mL 1  . albuterol (VENTOLIN HFA) 108 (90 Base) MCG/ACT inhaler Inhale 2 puffs into the lungs every 6 (six) hours as needed for wheezing or shortness of breath. 54 g 3  . benzonatate (TESSALON) 100 MG capsule TAKE 1-2 CAPSULES BY MOUTH 3 TIMES DAILY AS NEEDED FOR COUGH. 60 capsule 0  . budesonide (PULMICORT) 0.25 MG/2ML nebulizer solution Take 2 mLs (0.25 mg total) by nebulization 2 (two) times daily. 60 mL 12  . budesonide-formoterol (SYMBICORT) 80-4.5 MCG/ACT inhaler INHALE 2 PUFFS INTO THE LUNGS 2 TIMES DAILY. 10.2 g 3  . carvedilol (COREG) 12.5 MG tablet Take 1 tablet (12.5 mg total) by mouth 2 (two) times daily with a meal. 180 tablet 1  . cetirizine (ZYRTEC) 10 MG tablet Take 1 tablet (10 mg total) by mouth daily. 30 tablet 11  . fluticasone (FLONASE) 50 MCG/ACT nasal spray Place 2 sprays into both nostrils daily. 16 g 6  . furosemide (LASIX) 40 MG tablet Take 1 tablet (40 mg total) by mouth daily. 1 tablet by mouth daily and as needed 60 tablet 3  . losartan (COZAAR) 50 MG tablet TAKE 1 TABLET BY MOUTH DAILY. 90 tablet 1  . Potassium Chloride ER 20 MEQ TBCR TAKE 1 TABLET BY MOUTH 2 TIMES DAILY. 180 tablet 1  . potassium chloride SA (K-DUR,KLOR-CON) 20 MEQ tablet Take 1 tablet (20 mEq total) by mouth 2 (two) times daily. Take with each dose of lasix. 180 tablet 3  . PROAIR HFA  108 (90 Base) MCG/ACT inhaler INHALE 2 PUFFS INTO THE LUNGS EVERY 6 (SIX) HOURS AS NEEDED FOR WHEEZING OR SHORTNESS OF BREATH. 8.5 g 0   No current facility-administered medications for this visit.     PHYSICAL EXAMINATION: ECOG PERFORMANCE STATUS: 1 - Symptomatic but completely ambulatory  Vitals:   07/01/17 1120  BP: (!) 156/98  Pulse: 94  Resp: 19  Temp: 98.8 F (37.1 C)  SpO2: (!) 88%   Filed Weights    07/01/17 1120  Weight: (!) 336 lb 8 oz (152.6 kg)    GENERAL:alert, no distress and comfortable SKIN: skin color, texture, turgor are normal, no rashes or significant lesions EYES: normal, Conjunctiva are pink and non-injected, sclera clear OROPHARYNX:no exudate, no erythema and lips, buccal mucosa, and tongue normal  NECK: supple, thyroid normal size, non-tender, without nodularity LYMPH:  no palpable lymphadenopathy in the cervical, axillary or inguinal LUNGS: clear to auscultation and percussion with normal breathing effort HEART: regular rate & rhythm and no murmurs and no lower extremity edema ABDOMEN:abdomen soft, non-tender and normal bowel sounds MUSCULOSKELETAL:no cyanosis of digits and no clubbing  NEURO: alert & oriented x 3 with fluent speech, no focal motor/sensory deficits EXTREMITIES: No lower extremity edema  LABORATORY DATA:  I have reviewed the data as listed CMP Latest Ref Rng & Units 02/12/2017 08/15/2016 06/21/2016  Glucose 65 - 99 mg/dL 80 87 93  BUN 6 - 24 mg/dL 18 16 15   Creatinine 0.57 - 1.00 mg/dL 1.61 0.96(E) 4.54  Sodium 134 - 144 mmol/L 142 143 142  Potassium 3.5 - 5.2 mmol/L 4.7 4.7 4.5  Chloride 96 - 106 mmol/L 99 101 103  CO2 20 - 29 mmol/L 30(H) 27 29  Calcium 8.7 - 10.2 mg/dL 9.1 9.3 9.0  Total Protein 6.0 - 8.5 g/dL 7.7 - 6.9  Total Bilirubin 0.0 - 1.2 mg/dL 0.4 - 0.6  Alkaline Phos 39 - 117 IU/L 69 - 52  AST 0 - 40 IU/L 18 - 12  ALT 0 - 32 IU/L 16 - 10    Lab Results  Component Value Date   WBC 7.4 07/01/2017   HGB 13.9 07/01/2017   HCT 42.9 07/01/2017   MCV 98.9 07/01/2017   PLT 131 (L) 07/01/2017   NEUTROABS 5.2 07/01/2017    ASSESSMENT & PLAN:  Acute ITP ([facility]) Platelet counts ranged from 30 on 05/07/2014 to 59 December 2018 During June and July 2017 hospitalization platelet counts were between 45-106 The rest of the CBC and differential are normal 03/20/2017: Platelet count 77 Lowest platelet count 33  Diagnosis: Acute  ITP Treatment given: IVIG treatments 06/18/2017 Response to IVIG: Platelet count improved to 131.  Return to clinic in 2 months to recheck her blood counts and follow-up. I discussed with her that it is very possible that she may have a relapse with ITP. At the reason why we have to closely watch and monitor.     Orders Placed This Encounter  Procedures  . CBC with Differential (Cancer Center Only)    Standing Status:   Future    Standing Expiration Date:   07/02/2018   The patient has a good understanding of the overall plan. she agrees with it. she will call with any problems that may develop before the next visit here.   Tamsen Meek, MD 07/01/17

## 2017-07-09 MED FILL — SYMBICORT 80-4.5 MCG INH: 80-4.5 | 30 days supply | Qty: 10 | Fill #0

## 2017-07-09 MED FILL — POTASSIUM CL ER 20 MEQ TAB: 20 | 30 days supply | Qty: 60 | Fill #4

## 2017-07-09 MED FILL — !VENTOLIN HFA INHALER: 108 (90 BAS | 16 days supply | Qty: 18 | Fill #0

## 2017-07-09 MED FILL — FUROSEMIDE 40 MG TAB: 40 | 30 days supply | Qty: 60 | Fill #2

## 2017-07-09 MED FILL — LOSARTAN POTASSIUM 50 MG TA: 50 | 30 days supply | Qty: 30 | Fill #4

## 2017-07-11 ENCOUNTER — Other Ambulatory Visit: Payer: Self-pay | Admitting: Family Medicine

## 2017-07-15 MED FILL — ALBUTEROL 0.083% INHAL SOLN: (2.5 MG/3ML | 7 days supply | Qty: 90 | Fill #0

## 2017-07-24 ENCOUNTER — Ambulatory Visit: Payer: Self-pay | Admitting: Family Medicine

## 2017-07-25 ENCOUNTER — Telehealth: Payer: Self-pay | Admitting: Cardiovascular Disease

## 2017-07-25 NOTE — Telephone Encounter (Signed)
Spoke with patient and she stated her fiance faxed over a form last week. Advised patient did not receive. Per patient form was from [facility] regarding her oxygen and if she still needed. Called and spoke with [facility] and they stated they had mailed patient information regarding her no longer having Medicaid and now being self pay. Did ask who prescribing MD of oxygen was and it was Dr Jerral Ralph at [facility]. Called and spoke with patient and the form she received had nothing to do with the billing which she is trying to work out. She will have her fiance refax form

## 2017-07-25 NOTE — Telephone Encounter (Signed)
New Message:    Pt wanted to know if you received a fax last week, concerning her getting her Oxygen?

## 2017-07-25 NOTE — Telephone Encounter (Signed)
Routed to primary nurse Juliette Alcide

## 2017-07-26 ENCOUNTER — Encounter: Payer: Self-pay | Admitting: Family Medicine

## 2017-07-26 ENCOUNTER — Ambulatory Visit (INDEPENDENT_AMBULATORY_CARE_PROVIDER_SITE_OTHER): Payer: Self-pay | Admitting: Family Medicine

## 2017-07-26 VITALS — BP 120/76 | HR 84 | Temp 98.0°F | Ht 63.25 in | Wt 338.0 lb

## 2017-07-26 DIAGNOSIS — N390 Urinary tract infection, site not specified: Secondary | ICD-10-CM

## 2017-07-26 DIAGNOSIS — R319 Hematuria, unspecified: Secondary | ICD-10-CM

## 2017-07-26 DIAGNOSIS — J4540 Moderate persistent asthma, uncomplicated: Secondary | ICD-10-CM

## 2017-07-26 DIAGNOSIS — Z09 Encounter for follow-up examination after completed treatment for conditions other than malignant neoplasm: Secondary | ICD-10-CM

## 2017-07-26 DIAGNOSIS — E66813 Obesity, class 3: Secondary | ICD-10-CM

## 2017-07-26 DIAGNOSIS — Z6841 Body Mass Index (BMI) 40.0 and over, adult: Secondary | ICD-10-CM

## 2017-07-26 DIAGNOSIS — Z131 Encounter for screening for diabetes mellitus: Secondary | ICD-10-CM

## 2017-07-26 LAB — POCT GLYCOSYLATED HEMOGLOBIN (HGB A1C): Hemoglobin A1C: 5.2 % (ref 4.0–5.6)

## 2017-07-26 LAB — POCT URINALYSIS DIP (MANUAL ENTRY)
Bilirubin, UA: NEGATIVE
Glucose, UA: NEGATIVE mg/dL
Ketones, POC UA: NEGATIVE mg/dL
Nitrite, UA: NEGATIVE
Protein Ur, POC: 30 mg/dL — AB
Spec Grav, UA: 1.02 (ref 1.010–1.025)
Urobilinogen, UA: 1 E.U./dL
pH, UA: 6 (ref 5.0–8.0)

## 2017-07-26 MED ORDER — FLUTICASONE PROPIONATE 50 MCG/ACT NA SUSP: 2.0000 | Freq: Every day | NASAL | 3 refills | Status: AC

## 2017-07-26 MED FILL — FLUTICASONE PROP 50 MCG SPR: 50 | 30 days supply | Qty: 16 | Fill #0

## 2017-07-26 NOTE — Patient Instructions (Signed)
DASH Eating Plan DASH stands for "Dietary Approaches to Stop Hypertension." The DASH eating plan is a healthy eating plan that has been shown to reduce high blood pressure (hypertension). It may also reduce your risk for type 2 diabetes, heart disease, and stroke. The DASH eating plan may also help with weight loss. What are tips for following this plan? General guidelines  Avoid eating more than 2,300 mg (milligrams) of salt (sodium) a day. If you have hypertension, you may need to reduce your sodium intake to 1,500 mg a day.  Limit alcohol intake to no more than 1 drink a day for nonpregnant women and 2 drinks a day for men. One drink equals 12 oz of beer, 5 oz of wine, or 1 oz of hard liquor.  Work with your health care provider to maintain a healthy body weight or to lose weight. Ask what an ideal weight is for you.  Get at least 30 minutes of exercise that causes your heart to beat faster (aerobic exercise) most days of the week. Activities may include walking, swimming, or biking.  Work with your health care provider or diet and nutrition specialist (dietitian) to adjust your eating plan to your individual calorie needs. Reading food labels  Check food labels for the amount of sodium per serving. Choose foods with less than 5 percent of the Daily Value of sodium. Generally, foods with less than 300 mg of sodium per serving fit into this eating plan.  To find whole grains, look for the word "whole" as the first word in the ingredient list. Shopping  Buy products labeled as "low-sodium" or "no salt added."  Buy fresh foods. Avoid canned foods and premade or frozen meals. Cooking  Avoid adding salt when cooking. Use salt-free seasonings or herbs instead of table salt or sea salt. Check with your health care provider or pharmacist before using salt substitutes.  Do not fry foods. Cook foods using healthy methods such as baking, boiling, grilling, and broiling instead.  Cook with  heart-healthy oils, such as olive, canola, soybean, or sunflower oil. Meal planning   Eat a balanced diet that includes: ? 5 or more servings of fruits and vegetables each day. At each meal, try to fill half of your plate with fruits and vegetables. ? Up to 6-8 servings of whole grains each day. ? Less than 6 oz of lean meat, poultry, or fish each day. A 3-oz serving of meat is about the same size as a deck of cards. One egg equals 1 oz. ? 2 servings of low-fat dairy each day. ? A serving of nuts, seeds, or beans 5 times each week. ? Heart-healthy fats. Healthy fats called Omega-3 fatty acids are found in foods such as flaxseeds and coldwater fish, like sardines, salmon, and mackerel.  Limit how much you eat of the following: ? Canned or prepackaged foods. ? Food that is high in trans fat, such as fried foods. ? Food that is high in saturated fat, such as fatty meat. ? Sweets, desserts, sugary drinks, and other foods with added sugar. ? Full-fat dairy products.  Do not salt foods before eating.  Try to eat at least 2 vegetarian meals each week.  Eat more home-cooked food and less restaurant, buffet, and fast food.  When eating at a restaurant, ask that your food be prepared with less salt or no salt, if possible. What foods are recommended? The items listed may not be a complete list. Talk with your dietitian about what   dietary choices are best for you. Grains Whole-grain or whole-wheat bread. Whole-grain or whole-wheat pasta. Brown rice. Oatmeal. Quinoa. Bulgur. Whole-grain and low-sodium cereals. Pita bread. Low-fat, low-sodium crackers. Whole-wheat flour tortillas. Vegetables Fresh or frozen vegetables (raw, steamed, roasted, or grilled). Low-sodium or reduced-sodium tomato and vegetable juice. Low-sodium or reduced-sodium tomato sauce and tomato paste. Low-sodium or reduced-sodium canned vegetables. Fruits All fresh, dried, or frozen fruit. Canned fruit in natural juice (without  added sugar). Meat and other protein foods Skinless chicken or turkey. Ground chicken or turkey. Pork with fat trimmed off. Fish and seafood. Egg whites. Dried beans, peas, or lentils. Unsalted nuts, nut butters, and seeds. Unsalted canned beans. Lean cuts of beef with fat trimmed off. Low-sodium, lean deli meat. Dairy Low-fat (1%) or fat-free (skim) milk. Fat-free, low-fat, or reduced-fat cheeses. Nonfat, low-sodium ricotta or cottage cheese. Low-fat or nonfat yogurt. Low-fat, low-sodium cheese. Fats and oils Soft margarine without trans fats. Vegetable oil. Low-fat, reduced-fat, or light mayonnaise and salad dressings (reduced-sodium). Canola, safflower, olive, soybean, and sunflower oils. Avocado. Seasoning and other foods Herbs. Spices. Seasoning mixes without salt. Unsalted popcorn and pretzels. Fat-free sweets. What foods are not recommended? The items listed may not be a complete list. Talk with your dietitian about what dietary choices are best for you. Grains Baked goods made with fat, such as croissants, muffins, or some breads. Dry pasta or rice meal packs. Vegetables Creamed or fried vegetables. Vegetables in a cheese sauce. Regular canned vegetables (not low-sodium or reduced-sodium). Regular canned tomato sauce and paste (not low-sodium or reduced-sodium). Regular tomato and vegetable juice (not low-sodium or reduced-sodium). Pickles. Olives. Fruits Canned fruit in a light or heavy syrup. Fried fruit. Fruit in cream or butter sauce. Meat and other protein foods Fatty cuts of meat. Ribs. Fried meat. Bacon. Sausage. Bologna and other processed lunch meats. Salami. Fatback. Hotdogs. Bratwurst. Salted nuts and seeds. Canned beans with added salt. Canned or smoked fish. Whole eggs or egg yolks. Chicken or turkey with skin. Dairy Whole or 2% milk, cream, and half-and-half. Whole or full-fat cream cheese. Whole-fat or sweetened yogurt. Full-fat cheese. Nondairy creamers. Whipped toppings.  Processed cheese and cheese spreads. Fats and oils Butter. Stick margarine. Lard. Shortening. Ghee. Bacon fat. Tropical oils, such as coconut, palm kernel, or palm oil. Seasoning and other foods Salted popcorn and pretzels. Onion salt, garlic salt, seasoned salt, table salt, and sea salt. Worcestershire sauce. Tartar sauce. Barbecue sauce. Teriyaki sauce. Soy sauce, including reduced-sodium. Steak sauce. Canned and packaged gravies. Fish sauce. Oyster sauce. Cocktail sauce. Horseradish that you find on the shelf. Ketchup. Mustard. Meat flavorings and tenderizers. Bouillon cubes. Hot sauce and Tabasco sauce. Premade or packaged marinades. Premade or packaged taco seasonings. Relishes. Regular salad dressings. Where to find more information:  National Heart, Lung, and Blood Institute: www.nhlbi.nih.gov  American Heart Association: www.heart.org Summary  The DASH eating plan is a healthy eating plan that has been shown to reduce high blood pressure (hypertension). It may also reduce your risk for type 2 diabetes, heart disease, and stroke.  With the DASH eating plan, you should limit salt (sodium) intake to 2,300 mg a day. If you have hypertension, you may need to reduce your sodium intake to 1,500 mg a day.  When on the DASH eating plan, aim to eat more fresh fruits and vegetables, whole grains, lean proteins, low-fat dairy, and heart-healthy fats.  Work with your health care provider or diet and nutrition specialist (dietitian) to adjust your eating plan to your individual   calorie needs. This information is not intended to replace advice given to you by your health care provider. Make sure you discuss any questions you have with your health care provider. Document Released: 02/01/2011 Document Revised: 02/06/2016 Document Reviewed: 02/06/2016 Elsevier Interactive Patient Education  2018 Elsevier Inc.  

## 2017-07-26 NOTE — Progress Notes (Signed)
Subjective:     Patient ID: Jennifer Owens, female   DOB: Aug 22, 1970, 47 y.o.   MRN: [id number]  PCP: Raliegh Ip, NP   HPI  Jennifer Owens has a history of Thrombocytopenia, Sleep Apnea, Morbid Obesity, CHF, and Hypertension. She is here today for follow up.   Current Status: Since her last office visit, she states that Lasix is really helping with her edema. She states that she uses Albuterol inhaler 2 times daily and sometimes more. She only uses nebs once daily. She denies chest pain, cough, and heart palpitations. Denies visual changes, headaches, dizziness, unsteadiness, and falls.   Her appetite is good. She does not have abdominal pain, nausea, vomiting, diarrhea, and constipation. Denies blood in stool. Denies dysuria and hematuria. She denies any other incidents of bleeding.   She has been trying to loose weight and would like to be referred to Bariatric Center.   She denies anxiety and depression.   Denies pain at this time.   Past Medical History:  Diagnosis Date  . Asthma   . Chronic combined systolic and diastolic CHF (congestive heart failure) (HCC)    a. Echo 5/17: mild LVH, EF 55-60%, no RWMA, Gr 1 DD, mildly dilated Ao root, trivial eff //  b. Echo 6/17: mild conc LVH, EF 35-40%, RVE with mod RV dysfunction  . Hypertensive heart disease with CHF (congestive heart failure) (HCC)   . Moderate to severe pulmonary hypertension (HCC)    a RHC 7/17 with PAP 76/30  . Morbid obesity (HCC)   . NICM (nonischemic cardiomyopathy) (HCC)    a. LHC 7/17 with normal cors  . Obstructive sleep apnea 02/12/2017  . Sciatica   . Thrombocytopenia (HCC)   . Tobacco abuse     Family History  Problem Relation Age of Onset  . Diabetes Mellitus II Mother   . Hypertension Mother   . Diabetes Mellitus II Father     Social History   Socioeconomic History  . Marital status: Legally Separated    Spouse name: Not on file  . Number of children: Not on file  . Years of education: Not  on file  . Highest education level: Not on file  Occupational History  . 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 Financial resource strain: Not on file  . Food insecurity:    Worry: Not on file    Inability: Not on file  . Transportation needs:    Medical: Not on file    Non-medical: Not on file  Tobacco Use  . Smoking status: Former Smoker    Packs/day: 0.25    Years: 10.00    Pack years: 2.50    Types: Cigarettes    Last attempt to quit: 07/2015    Years since quitting: 1.9  . Smokeless tobacco: Never Used  Substance and Sexual Activity  . Alcohol use: No    Alcohol/week: 0.6 oz    Types: 1 Standard drinks or equivalent per week    Comment: very rarely - 1-2 drinks/month  . Drug use: No  . Sexual activity: Not on file  Lifestyle  . Physical activity:    Days per week: Not on file    Minutes per session: Not on file  . Stress: Not on file  Relationships  . Social connections:    Talks on phone: Not on file    Gets together: Not on file    Attends religious service: Not on file  Active member of club or organization: Not on file    Attends meetings of clubs or organizations: Not on file    Relationship status: Not on file  . Intimate partner violence:    Fear of current or ex partner: Not on file    Emotionally abused: Not on file    Physically abused: Not on file    Forced sexual activity: Not on file  Other Topics Concern  . Not on file  Social History Narrative  . Not on file    Past Surgical History:  Procedure Laterality Date  . APPENDECTOMY    . CARDIAC CATHETERIZATION N/A 08/29/2015   Procedure: Right/Left Heart Cath and Coronary Angiography;  Surgeon: [name], MD;  Location: [facility] INVASIVE CV LAB;  Service: Cardiovascular;  Laterality: N/A;  . CESAREAN SECTION    . CHOLECYSTECTOMY    . SMALL INTESTINE SURGERY      Immunization History  Administered Date(s) Administered  . Influenza,inj,Quad PF,6+ Mos 11/28/2015,  11/13/2016  . Pneumococcal Polysaccharide-23 07/23/2015  . Tdap 06/27/2015    Current Meds  Medication Sig  . acetaminophen-codeine (TYLENOL #3) 300-30 MG tablet Take 0.5-1 tablets by mouth every 8 (eight) hours as needed for moderate pain or severe pain (Do not consume greater than 2000 mg acetiminophen within 24 hours).  Marland Kitchen albuterol (PROVENTIL HFA;VENTOLIN HFA) 108 (90 Base) MCG/ACT inhaler Inhale 2 puffs into the lungs every 4 (four) hours as needed for wheezing or shortness of breath (cough, shortness of breath or wheezing.).  Marland Kitchen albuterol (PROVENTIL) (2.5 MG/3ML) 0.083% nebulizer solution TAKE 3 MLS BY NEBULIZATION EVERY 6 (SIX) HOURS AS NEEDED FOR WHEEZING OR SHORTNESS OF BREATH.  . budesonide (PULMICORT) 0.25 MG/2ML nebulizer solution Take 2 mLs (0.25 mg total) by nebulization 2 (two) times daily.  . budesonide-formoterol (SYMBICORT) 80-4.5 MCG/ACT inhaler INHALE 2 PUFFS INTO THE LUNGS 2 TIMES DAILY.  . carvedilol (COREG) 12.5 MG tablet Take 1 tablet (12.5 mg total) by mouth 2 (two) times daily with a meal.  . cetirizine (ZYRTEC) 10 MG tablet Take 1 tablet (10 mg total) by mouth daily.  . fluticasone (FLONASE) 50 MCG/ACT nasal spray Place 2 sprays into both nostrils daily.  . furosemide (LASIX) 40 MG tablet Take 1 tablet (40 mg total) by mouth daily. 1 tablet by mouth daily and as needed  . losartan (COZAAR) 50 MG tablet TAKE 1 TABLET BY MOUTH DAILY.  Marland Kitchen Potassium Chloride ER 20 MEQ TBCR TAKE 1 TABLET BY MOUTH 2 TIMES DAILY.  Marland Kitchen potassium chloride SA (K-DUR,KLOR-CON) 20 MEQ tablet Take 1 tablet (20 mEq total) by mouth 2 (two) times daily. Take with each dose of lasix.  Marland Kitchen PROAIR HFA 108 (90 Base) MCG/ACT inhaler INHALE 2 PUFFS INTO THE LUNGS EVERY 6 (SIX) HOURS AS NEEDED FOR WHEEZING OR SHORTNESS OF BREATH.   No Known Allergies  BP 120/76 (BP Location: Left Arm, Patient Position: Sitting, Cuff Size: Large)   Pulse 84   Temp 98 F (36.7 C) (Oral)   Ht 5' 3.25" (1.607 m)   Wt (!) 338 lb  (153.3 kg)   LMP 07/11/2017   SpO2 93%   BMI 59.40 kg/m    Review of Systems  Constitutional: Negative.   HENT: Negative.   Eyes: Negative.   Respiratory: Positive for shortness of breath and wheezing.   Cardiovascular: Negative.   Gastrointestinal: Positive for abdominal distention (obese).  Endocrine: Negative.   Genitourinary: Negative.   Musculoskeletal: Negative.   Skin: Negative.   Allergic/Immunologic: Negative.  Neurological: Negative.   Hematological: Negative.   Psychiatric/Behavioral: Negative.    Objective:   Physical Exam  Constitutional: She is oriented to person, place, and time. She appears well-developed and well-nourished.  HENT:  Head: Normocephalic and atraumatic.  Right Ear: External ear normal.  Left Ear: External ear normal.  Nose: Nose normal.  Mouth/Throat: Oropharynx is clear and moist.  Eyes: Pupils are equal, round, and reactive to light. Conjunctivae and EOM are normal.  Neck: Normal range of motion. Neck supple.  Cardiovascular: Normal rate, regular rhythm, normal heart sounds and intact distal pulses.  Pulmonary/Chest: Effort normal. She has wheezes.  Abdominal: Soft. Bowel sounds are normal.  Musculoskeletal: Normal range of motion.  Neurological: She is alert and oriented to person, place, and time.  Skin: Skin is warm and dry. Capillary refill takes less than 2 seconds.  Psychiatric: She has a normal mood and affect. Her behavior is normal. Judgment and thought content normal.  Nursing note and vitals reviewed.  Assessment:   Plan:   1. Screening for diabetes mellitus Hgb A1c is 5.3 today.  - POCT glycosylated hemoglobin (Hb A1C) - POCT urinalysis dipstick  2. Moderate persistent asthma without complication She will use Flonase daily as directed to minimize allergies.  - fluticasone (FLONASE) 50 MCG/ACT nasal spray; Place 2 sprays into both nostrils daily.  Dispense: 16 g; Refill: 3  3. Class 3 severe obesity due to excess  calories with serious comorbidity and body mass index (BMI) of 50.0 to 59.9 in adult [facility]) She will continue to follow DASH diet, decreasing high sugar foods, high fat foods, and increasing veggies, water, and foods low in fat. She will continue to get at least 30 minutes of cardio exercise daily. We will refer her to Nutritionist and Bariatric Center.  - Amb Referral to Bariatric Surgery - Referral to Nutrition and Diabetes Services  4. UTI -Augmentin 875 mg BID.   5. Follow up She will follow up in 3 months.   Meds ordered this encounter  Medications  . fluticasone (FLONASE) 50 MCG/ACT nasal spray    Sig: Place 2 sprays into both nostrils daily.    Dispense:  16 g    Refill:  3  . amoxicillin (AMOXIL) 875 MG tablet    Sig: Take 1 tablet (875 mg total) by mouth 2 (two) times daily.    Dispense:  20 tablet    Refill:  0   Raliegh Ip,  MSN, FNP-BC Patient [facility] [address] [place], [place] [address] [phone]

## 2017-07-30 MED ORDER — AMOXICILLIN 875 MG PO TABS: 875.0000 mg | ORAL_TABLET | Freq: Two times a day (BID) | ORAL | 0 refills | Status: AC

## 2017-07-30 MED FILL — AMOXICILLIN 875 MG TABS: 875 | 10 days supply | Qty: 20 | Fill #0

## 2017-07-31 ENCOUNTER — Telehealth: Payer: Self-pay

## 2017-07-31 NOTE — Telephone Encounter (Signed)
-----   Message from Kallie Locks, FNP sent at 07/30/2017  4:36 PM EDT ----- Regarding: "Lab Results" Jennifer Owens,   Please call [name] and let her know that her labs are good.  She does have a UTI, so we sent a Rx for Augmentin to pharmacy, she is to take as directed.  We also referred her to Nutrition and Bariatric.  Thank you,   Dorene Grebe.

## 2017-07-31 NOTE — Telephone Encounter (Signed)
Patient notified and will pick up medication  

## 2017-08-06 MED FILL — LOSARTAN POTASSIUM 50 MG TA: 50 | 30 days supply | Qty: 30 | Fill #5

## 2017-08-11 ENCOUNTER — Other Ambulatory Visit: Payer: Self-pay

## 2017-08-11 ENCOUNTER — Encounter (HOSPITAL_BASED_OUTPATIENT_CLINIC_OR_DEPARTMENT_OTHER): Payer: Self-pay | Admitting: *Deleted

## 2017-08-11 ENCOUNTER — Emergency Department (HOSPITAL_BASED_OUTPATIENT_CLINIC_OR_DEPARTMENT_OTHER)
Admission: EM | Admit: 2017-08-11 | Discharge: 2017-08-11 | Discharge status: Against Medical Advice | Payer: Self-pay | Attending: Emergency Medicine | Admitting: Emergency Medicine

## 2017-08-11 ENCOUNTER — Emergency Department (HOSPITAL_BASED_OUTPATIENT_CLINIC_OR_DEPARTMENT_OTHER): Payer: Self-pay

## 2017-08-11 DIAGNOSIS — I110 Hypertensive heart disease with heart failure: Secondary | ICD-10-CM | POA: Insufficient documentation

## 2017-08-11 DIAGNOSIS — M25522 Pain in left elbow: Secondary | ICD-10-CM | POA: Insufficient documentation

## 2017-08-11 DIAGNOSIS — J45909 Unspecified asthma, uncomplicated: Secondary | ICD-10-CM | POA: Insufficient documentation

## 2017-08-11 DIAGNOSIS — M25422 Effusion, left elbow: Secondary | ICD-10-CM | POA: Insufficient documentation

## 2017-08-11 DIAGNOSIS — I5042 Chronic combined systolic (congestive) and diastolic (congestive) heart failure: Secondary | ICD-10-CM | POA: Insufficient documentation

## 2017-08-11 DIAGNOSIS — Z79899 Other long term (current) drug therapy: Secondary | ICD-10-CM | POA: Insufficient documentation

## 2017-08-11 DIAGNOSIS — Z87891 Personal history of nicotine dependence: Secondary | ICD-10-CM | POA: Insufficient documentation

## 2017-08-11 LAB — CBC WITH DIFFERENTIAL/PLATELET
BASOS PCT: 0 %
Basophils Absolute: 0 10*3/uL (ref 0.0–0.1)
Eosinophils Absolute: 0.2 10*3/uL (ref 0.0–0.7)
Eosinophils Relative: 2 %
HEMATOCRIT: 43.6 % (ref 36.0–46.0)
Hemoglobin: 13.6 g/dL (ref 12.0–15.0)
LYMPHS ABS: 1.7 10*3/uL (ref 0.7–4.0)
LYMPHS PCT: 20 %
MCH: 32.4 pg (ref 26.0–34.0)
MCHC: 31.2 g/dL (ref 30.0–36.0)
MCV: 103.8 fL — AB (ref 78.0–100.0)
MONO ABS: 0.9 10*3/uL (ref 0.1–1.0)
MONOS PCT: 11 %
NEUTROS ABS: 5.6 10*3/uL (ref 1.7–7.7)
NEUTROS PCT: 67 %
Platelets: 66 10*3/uL — ABNORMAL LOW (ref 150–400)
RBC: 4.2 MIL/uL (ref 3.87–5.11)
RDW: 12.6 % (ref 11.5–15.5)
WBC: 8.4 10*3/uL (ref 4.0–10.5)

## 2017-08-11 LAB — BASIC METABOLIC PANEL
Anion gap: 4 — ABNORMAL LOW (ref 5–15)
BUN: 15 mg/dL (ref 6–20)
CHLORIDE: 95 mmol/L — AB (ref 101–111)
CO2: 38 mmol/L — ABNORMAL HIGH (ref 22–32)
Calcium: 8.4 mg/dL — ABNORMAL LOW (ref 8.9–10.3)
Creatinine, Ser: 1.03 mg/dL — ABNORMAL HIGH (ref 0.44–1.00)
GFR calc Af Amer: >60 mL/min (ref >60)
GFR calc non Af Amer: >60 mL/min (ref >60)
GLUCOSE: 121 mg/dL — AB (ref 65–99)
POTASSIUM: 4.2 mmol/L (ref 3.5–5.1)
Sodium: 137 mmol/L (ref 135–145)

## 2017-08-11 LAB — C-REACTIVE PROTEIN: CRP: 11.8 mg/dL — ABNORMAL HIGH (ref <1.0)

## 2017-08-11 LAB — URIC ACID: Uric Acid, Serum: 10.8 mg/dL — ABNORMAL HIGH (ref 2.3–6.6)

## 2017-08-11 LAB — SEDIMENTATION RATE: Sed Rate: 32 mm/hr — ABNORMAL HIGH (ref 0–22)

## 2017-08-11 MED ORDER — IBUPROFEN 800 MG PO TABS
800.0000 mg | ORAL_TABLET | Freq: Once | ORAL | Status: AC
  Administered 2017-08-11: 800 mg via ORAL
  Filled 2017-08-11 (×2): qty 1

## 2017-08-11 MED ORDER — PREDNISONE 50 MG PO TABS: ORAL_TABLET | ORAL | 0 refills | Status: AC

## 2017-08-11 MED ORDER — OXYCODONE-ACETAMINOPHEN 5-325 MG PO TABS
2.0000 | ORAL_TABLET | Freq: Once | ORAL | Status: AC
  Administered 2017-08-11: 2 via ORAL
  Filled 2017-08-11: qty 2

## 2017-08-11 MED ORDER — COLCHICINE 0.6 MG PO TABS: 0.6000 mg | ORAL_TABLET | Freq: Two times a day (BID) | ORAL | 0 refills | Status: AC

## 2017-08-11 NOTE — ED Triage Notes (Signed)
Left elbow pain x several days. Denies known injury.

## 2017-08-11 NOTE — ED Provider Notes (Signed)
[facility] EMERGENCY DEPARTMENT Provider Note   CSN: [id number] Arrival date & time: 08/11/17  0009     History   Chief Complaint Chief Complaint  Patient presents with  . Elbow Pain    HPI Jennifer Owens is a 47 y.o. female.  Patient with history of morbid obesity, pulmonary hypertension, nonischemic cardiomyopathy on home oxygen and thrombocytopenia presenting with a 2-day history of atraumatic left elbow pain.  Describes pain and swelling without trauma.  She is never had this kind of pain before.  Does have a history of "arthritis".  No fever, chills, nausea or vomiting.  She was found to be hypoxic on arrival but she was not wearing her oxygen and she feels like her breathing is at baseline now.  Denies any chest pain. She denies any history of gout.  No previous history of problems with this elbow.  No weakness, numbness or tingling.  She is been taking Tylenol with codeine without relief.  The history is provided by the patient.    Past Medical History:  Diagnosis Date  . Asthma   . Chronic combined systolic and diastolic CHF (congestive heart failure) ([place])    a. Echo 5/17: mild LVH, EF 55-60%, no RWMA, Gr 1 DD, mildly dilated Ao root, trivial eff //  b. Echo 6/17: mild conc LVH, EF 35-40%, RVE with mod RV dysfunction  . Hypertensive heart disease with CHF (congestive heart failure) ([place])   . Moderate to severe pulmonary hypertension ([place])    a RHC 7/17 with PAP 76/30  . Morbid obesity ([place])   . NICM (nonischemic cardiomyopathy) ([place])    a. Tensed 7/17 with normal cors  . Obstructive sleep apnea 02/12/2017  . Sciatica   . Thrombocytopenia ([place])   . Tobacco abuse     Patient Active Problem List   Diagnosis Date Noted  . 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 Obstructive sleep apnea 02/12/2017  . NICM (nonischemic cardiomyopathy) ([place]) 09/06/2015  . Pulmonary hypertension ([place])   . Chronic combined systolic and diastolic heart failure ([place])   . Morbid obesity  ([place])   . Hypertensive heart disease 08/17/2015  . Hypoxia 07/22/2015  . Asthma 07/22/2015  . Thrombocytopenia ([place]) 07/22/2015  . Tobacco abuse 07/22/2015    Past Surgical History:  Procedure Laterality Date  . APPENDECTOMY    . CARDIAC CATHETERIZATION N/A 08/29/2015   Procedure: Right/Left Heart Cath and Coronary Angiography;  Surgeon: Belva Crome, MD;  Location: [facility];  Service: Cardiovascular;  Laterality: N/A;  . CESAREAN SECTION    . CHOLECYSTECTOMY    . SMALL INTESTINE SURGERY       OB History   None      Home Medications    Prior to Admission medications   Medication Sig Start Date End Date Taking? Authorizing Provider  acetaminophen-codeine (TYLENOL #3) 300-30 MG tablet Take 0.5-1 tablets by mouth every 8 (eight) hours as needed for moderate pain or severe pain (Do not consume greater than 2000 mg acetiminophen within 24 hours). 04/05/17   Scot Jun, FNP  albuterol (PROVENTIL HFA;VENTOLIN HFA) 108 (90 Base) MCG/ACT inhaler Inhale 2 puffs into the lungs every 4 (four) hours as needed for wheezing or shortness of breath (cough, shortness of breath or wheezing.). 04/10/17   Scot Jun, FNP  albuterol (PROVENTIL) (2.5 MG/3ML) 0.083% nebulizer solution TAKE 3 MLS BY NEBULIZATION EVERY 6 (SIX) HOURS AS NEEDED FOR WHEEZING OR SHORTNESS OF BREATH. 07/15/17   Tresa Garter,  MD  amoxicillin (AMOXIL) 875 MG tablet Take 1 tablet (875 mg total) by mouth 2 (two) times daily. 07/30/17   Azzie Glatter, FNP  budesonide (PULMICORT) 0.25 MG/2ML nebulizer solution Take 2 mLs (0.25 mg total) by nebulization 2 (two) times daily. 02/22/17   Scot Jun, FNP  budesonide-formoterol (SYMBICORT) 80-4.5 MCG/ACT inhaler INHALE 2 PUFFS INTO THE LUNGS 2 TIMES DAILY. 01/01/17   Scot Jun, FNP  carvedilol (COREG) 12.5 MG tablet Take 1 tablet (12.5 mg total) by mouth 2 (two) times daily with a meal. 03/13/17   Skeet Latch, MD  cetirizine (ZYRTEC) 10 MG  tablet Take 1 tablet (10 mg total) by mouth daily. 06/21/16   Scot Jun, FNP  fluticasone (FLONASE) 50 MCG/ACT nasal spray Place 2 sprays into both nostrils daily. 07/26/17   Azzie Glatter, FNP  furosemide (LASIX) 40 MG tablet Take 1 tablet (40 mg total) by mouth daily. 1 tablet by mouth daily and as needed 03/05/17   Scot Jun, FNP  losartan (COZAAR) 50 MG tablet TAKE 1 TABLET BY MOUTH DAILY. 03/04/17   Skeet Latch, MD  Potassium Chloride ER 20 MEQ TBCR TAKE 1 TABLET BY MOUTH 2 TIMES DAILY. 03/04/17   Skeet Latch, MD  potassium chloride SA (K-DUR,KLOR-CON) 20 MEQ tablet Take 1 tablet (20 mEq total) by mouth 2 (two) times daily. Take with each dose of lasix. 02/23/17   Scot Jun, FNP  PROAIR HFA 108 [phone] Base) MCG/ACT inhaler INHALE 2 PUFFS INTO THE LUNGS EVERY 6 (SIX) HOURS AS NEEDED FOR WHEEZING OR SHORTNESS OF BREATH. 04/09/17   Scot Jun, FNP    Family History Family History  Problem Relation Age of Onset  . Diabetes Mellitus II Mother   . Hypertension Mother   . Diabetes Mellitus II Father     Social History Social History   Tobacco Use  . Smoking status: Former Smoker    Packs/day: 0.25    Years: 10.00    Pack years: 2.50    Types: Cigarettes    Last attempt to quit: 07/2015    Years since quitting: 2.0  . Smokeless tobacco: Never Used  Substance Use Topics  . Alcohol use: No    Alcohol/week: 0.6 oz    Types: 1 Standard drinks or equivalent per week    Comment: very rarely - 1-2 drinks/month  . Drug use: No     Allergies   Patient has no known allergies.   Review of Systems Review of Systems  Constitutional: Negative for activity change, appetite change and fever.  HENT: Negative for congestion and rhinorrhea.   Eyes: Negative for visual disturbance.  Respiratory: Negative for cough, chest tightness and shortness of breath.   Cardiovascular: Negative for chest pain.  Gastrointestinal: Negative for abdominal pain, nausea  and vomiting.  Genitourinary: Negative for dysuria and hematuria.  Musculoskeletal: Positive for arthralgias and myalgias.  Skin: Negative for rash.  Neurological: Negative for dizziness, weakness and headaches.   all other systems are negative except as noted in the HPI and PMH.     Physical Exam Updated Vital Signs BP 114/78   Pulse 76   Temp 98.2 F (36.8 C)   Resp (!) 21   Ht 5' 3.5" (1.613 m)   Wt (!) 154.2 kg (340 lb)   SpO2 96%   BMI 59.28 kg/m   Physical Exam  Constitutional: She is oriented to person, place, and time. She appears well-developed and well-nourished. No distress.  Morbidly obese  HENT:  Head: Normocephalic and atraumatic.  Mouth/Throat: Oropharynx is clear and moist. No oropharyngeal exudate.  Eyes: Pupils are equal, round, and reactive to light. Conjunctivae and EOM are normal.  Neck: Normal range of motion. Neck supple.  No meningismus.  Cardiovascular: Normal rate, regular rhythm, normal heart sounds and intact distal pulses.  No murmur heard. Pulmonary/Chest: Effort normal and breath sounds normal. No respiratory distress.  Abdominal: Soft. There is no tenderness. There is no rebound and no guarding.  Musculoskeletal: She exhibits edema and tenderness.  Left elbow has diffuse edema.  Tenderness to palpation on the medial and lateral aspects without significant erythema or warmth.  Reduced range of motion due to pain. Intact radial pulse and grip strength.  Neurological: She is alert and oriented to person, place, and time. No cranial nerve deficit. She exhibits normal muscle tone. Coordination normal.  No ataxia on finger to nose bilaterally. No pronator drift. 5/5 strength throughout. CN 2-12 intact.Equal grip strength. Sensation intact.   Skin: Skin is warm.  Psychiatric: She has a normal mood and affect. Her behavior is normal.  Nursing note and vitals reviewed.    ED Treatments / Results  Labs (all labs ordered are listed, but only  abnormal results are displayed) Labs Reviewed  CBC WITH DIFFERENTIAL/PLATELET - Abnormal; Notable for the following components:      Result Value   MCV 103.8 (*)    Platelets 66 (*)    All other components within normal limits  BASIC METABOLIC PANEL - Abnormal; Notable for the following components:   Chloride 95 (*)    CO2 38 (*)    Glucose, Bld 121 (*)    Creatinine, Ser 1.03 (*)    Calcium 8.4 (*)    Anion gap 4 (*)    All other components within normal limits  URIC ACID - Abnormal; Notable for the following components:   Uric Acid, Serum 10.8 (*)    All other components within normal limits  SEDIMENTATION RATE  C-REACTIVE PROTEIN    EKG None  Radiology Dg Chest 2 View  Result Date: 08/11/2017 CLINICAL DATA:  Wheezing and congestion. EXAM: CHEST - 2 VIEW COMPARISON:  04/05/2017 FINDINGS: Unchanged cardiomegaly. Mediastinal contours are unchanged. Mild peribronchial cuffing. No focal airspace disease. No pneumothorax or pleural effusion. Soft tissue attenuation from body habitus partially limits assessment. IMPRESSION: Unchanged cardiomegaly. Mild peribronchial cuffing may be bronchitic or pulmonary edema. Electronically Signed   By: Jeb Levering M.D.   On: 08/11/2017 02:46   Dg Elbow Complete Left  Result Date: 08/11/2017 CLINICAL DATA:  Left elbow pain for 3-4 days.  No known injury. EXAM: LEFT ELBOW - COMPLETE 3+ VIEW COMPARISON:  None. FINDINGS: Moderate joint effusion. No fracture or subluxation. Mild degenerative spurring of the coronoid process. No bony destructive change or osseous erosions. Mild soft tissue edema posteriorly. IMPRESSION: Moderate joint effusion without acute osseous abnormality. Electronically Signed   By: Jeb Levering M.D.   On: 08/11/2017 02:45    Procedures Procedures (including critical care time)  Medications Ordered in ED Medications  oxyCODONE-acetaminophen (PERCOCET/ROXICET) 5-325 MG per tablet 2 tablet (2 tablets Oral Given 08/11/17  0436)  ibuprofen (ADVIL,MOTRIN) tablet 800 mg (800 mg Oral Given 08/11/17 0436)     Initial Impression / Assessment and Plan / ED Course  I have reviewed the triage vital signs and the nursing notes.  Pertinent labs & imaging results that were available during my care of the patient were reviewed by me and considered in  my medical decision making (see chart for details).    2 days of atraumatic left elbow pain.  No fever.  No other joint pain.  Effusion on x-ray.  Clinically patient has no warmth erythema.  White count is normal.  Uric acid and ESR are elevated.  Discussed with patient that septic joint cannot be ruled out without arthrocentesis.  Patient states her elbow pain is better after pain medication she is able to range it.  She adamantly declines at present today.  We will treat for possible gout with steroids and colchicine. She continues to decline joint aspiration and will leave [facility] because septic joint cannot be ruled out.  Advise follow-up with orthopedics.  Return precautions discussed. Final Clinical Impressions(s) / ED Diagnoses   Final diagnoses:  Elbow swelling, left    ED Discharge Orders    None       Rancour, Annie Main, MD 08/11/17 [id number]

## 2017-08-11 NOTE — ED Notes (Signed)
Pt to xray via stretcher, alert, NAD, calm.

## 2017-08-11 NOTE — ED Notes (Signed)
Alert, NAD, calm, interactive, resps e/u, speaking in clear complete sentences, no dyspnea noted, skin W&D, VSS, c/o L elbow pain, CMS intact, worse with movement or use, (denies: injury, shoulder, chest, back, or rib pain, or sob, NVD, numbness, tingling, fever, dizziness or visual changes). Family at [facility].

## 2017-08-11 NOTE — ED Notes (Signed)
Back from radiology, no changes, alert, NAD, calm.

## 2017-08-11 NOTE — Discharge Instructions (Addendum)
As we discussed, infection cannot be ruled out without aspirating the elbow joint.  You are leaving AGAINST MEDICAL ADVICE.  Follow-up with the orthopedic doctor and return to the ED if you have worsening pain, fever, chills, nausea vomiting or other concerns.

## 2017-08-21 MED FILL — POTASSIUM CL ER 20 MEQ TAB: 20 | 30 days supply | Qty: 60 | Fill #5

## 2017-08-22 ENCOUNTER — Ambulatory Visit: Payer: Self-pay | Admitting: Registered"

## 2017-08-26 ENCOUNTER — Inpatient Hospital Stay (HOSPITAL_BASED_OUTPATIENT_CLINIC_OR_DEPARTMENT_OTHER): Payer: Medicaid Other | Admitting: Hematology and Oncology

## 2017-08-26 ENCOUNTER — Telehealth: Payer: Self-pay | Admitting: Hematology and Oncology

## 2017-08-26 ENCOUNTER — Inpatient Hospital Stay: Payer: Medicaid Other | Attending: Hematology and Oncology

## 2017-08-26 ENCOUNTER — Telehealth: Payer: Self-pay

## 2017-08-26 ENCOUNTER — Other Ambulatory Visit: Payer: Self-pay | Admitting: Family Medicine

## 2017-08-26 VITALS — BP 136/85 | HR 93 | Temp 99.0°F | Resp 19 | Ht 63.5 in | Wt 337.1 lb

## 2017-08-26 DIAGNOSIS — R0902 Hypoxemia: Secondary | ICD-10-CM | POA: Diagnosis present

## 2017-08-26 DIAGNOSIS — D693 Immune thrombocytopenic purpura: Secondary | ICD-10-CM

## 2017-08-26 DIAGNOSIS — Z993 Dependence on wheelchair: Secondary | ICD-10-CM | POA: Insufficient documentation

## 2017-08-26 DIAGNOSIS — J0100 Acute maxillary sinusitis, unspecified: Secondary | ICD-10-CM | POA: Diagnosis present

## 2017-08-26 DIAGNOSIS — R062 Wheezing: Secondary | ICD-10-CM | POA: Insufficient documentation

## 2017-08-26 LAB — CBC WITH DIFFERENTIAL (CANCER CENTER ONLY)
BASOS PCT: 0 %
Basophils Absolute: 0 10*3/uL (ref 0.0–0.1)
EOS ABS: 0.1 10*3/uL (ref 0.0–0.5)
EOS PCT: 1 %
HCT: 39.4 % (ref 34.8–46.6)
Hemoglobin: 11.9 g/dL (ref 11.6–15.9)
Lymphocytes Relative: 25 %
Lymphs Abs: 1.8 10*3/uL (ref 0.9–3.3)
MCH: 31.8 pg (ref 25.1–34.0)
MCHC: 30.2 g/dL — ABNORMAL LOW (ref 31.5–36.0)
MCV: 105.3 fL — ABNORMAL HIGH (ref 79.5–101.0)
MONO ABS: 0.8 10*3/uL (ref 0.1–0.9)
Monocytes Relative: 10 %
Neutro Abs: 4.7 10*3/uL (ref 1.5–6.5)
Neutrophils Relative %: 64 %
PLATELETS: 36 10*3/uL — AB (ref 145–400)
RBC: 3.74 MIL/uL (ref 3.70–5.45)
RDW: 13.7 % (ref 11.2–14.5)
WBC Count: 7.4 10*3/uL (ref 3.9–10.3)

## 2017-08-26 NOTE — Progress Notes (Signed)
Patient Care Team: Kallie Locks, FNP as PCP - General (Family Medicine)  DIAGNOSIS:  Encounter Diagnosis  Name Primary?  . Acute ITP (HCC) Yes   CHIEF COMPLIANT: Follow-up of lab work for acute ITP.  Continues to be wheelchair-bound, uses oxygen by nasal cannula  INTERVAL HISTORY: Jennifer Owens is a 46-year with above-mentioned history of acute ITP who is here for lab check.  In April 2019 she received IVIG treatments with excellent improvement in the platelet count.  Today her platelet count was 33.  So far she has not had any bleeding symptoms.  REVIEW OF SYSTEMS:   Constitutional: Denies fevers, chills or abnormal weight loss Eyes: Denies blurriness of vision Ears, nose, mouth, throat, and face: Denies mucositis or sore throat Respiratory: Denies cough, dyspnea or wheezes Cardiovascular: Denies palpitation, chest discomfort Gastrointestinal:  Denies nausea, heartburn or change in bowel habits Skin: Denies abnormal skin rashes Lymphatics: Denies new lymphadenopathy or easy bruising Neurological:Denies numbness, tingling or new weaknesses Behavioral/Psych: Mood is stable, no new changes  Extremities: No lower extremity edema  All other systems were reviewed with the patient and are negative.  I have reviewed the past medical history, past surgical history, social history and family history with the patient and they are unchanged from previous note.  ALLERGIES:  has No Known Allergies.  MEDICATIONS:  Current Outpatient Medications  Medication Sig Dispense Refill  . acetaminophen-codeine (TYLENOL #3) 300-30 MG tablet Take 0.5-1 tablets by mouth every 8 (eight) hours as needed for moderate pain or severe pain (Do not consume greater than 2000 mg acetiminophen within 24 hours). 30 tablet 0  . albuterol (PROVENTIL HFA;VENTOLIN HFA) 108 (90 Base) MCG/ACT inhaler Inhale 2 puffs into the lungs every 4 (four) hours as needed for wheezing or shortness of breath (cough, shortness of  breath or wheezing.). 1 Inhaler 3  . albuterol (PROVENTIL) (2.5 MG/3ML) 0.083% nebulizer solution TAKE 3 MLS BY NEBULIZATION EVERY 6 (SIX) HOURS AS NEEDED FOR WHEEZING OR SHORTNESS OF BREATH. 90 mL 1  . amoxicillin (AMOXIL) 875 MG tablet Take 1 tablet (875 mg total) by mouth 2 (two) times daily. 20 tablet 0  . budesonide (PULMICORT) 0.25 MG/2ML nebulizer solution Take 2 mLs (0.25 mg total) by nebulization 2 (two) times daily. 60 mL 12  . budesonide-formoterol (SYMBICORT) 80-4.5 MCG/ACT inhaler INHALE 2 PUFFS INTO THE LUNGS 2 TIMES DAILY. 10.2 g 3  . carvedilol (COREG) 12.5 MG tablet Take 1 tablet (12.5 mg total) by mouth 2 (two) times daily with a meal. 180 tablet 1  . cetirizine (ZYRTEC) 10 MG tablet Take 1 tablet (10 mg total) by mouth daily. 30 tablet 11  . colchicine 0.6 MG tablet Take 1 tablet (0.6 mg total) by mouth 2 (two) times daily. 4 tablet 0  . fluticasone (FLONASE) 50 MCG/ACT nasal spray Place 2 sprays into both nostrils daily. 16 g 3  . furosemide (LASIX) 40 MG tablet Take 1 tablet (40 mg total) by mouth daily. 1 tablet by mouth daily and as needed 60 tablet 3  . losartan (COZAAR) 50 MG tablet TAKE 1 TABLET BY MOUTH DAILY. 90 tablet 1  . Potassium Chloride ER 20 MEQ TBCR TAKE 1 TABLET BY MOUTH 2 TIMES DAILY. 180 tablet 1  . potassium chloride SA (K-DUR,KLOR-CON) 20 MEQ tablet Take 1 tablet (20 mEq total) by mouth 2 (two) times daily. Take with each dose of lasix. 180 tablet 3  . predniSONE (DELTASONE) 50 MG tablet 1 tablet PO daily 5 tablet 0  .  PROAIR HFA 108 (90 Base) MCG/ACT inhaler INHALE 2 PUFFS INTO THE LUNGS EVERY 6 (SIX) HOURS AS NEEDED FOR WHEEZING OR SHORTNESS OF BREATH. 8.5 g 0   No current facility-administered medications for this visit.     PHYSICAL EXAMINATION: ECOG PERFORMANCE STATUS: 1 - Symptomatic but completely ambulatory  Vitals:   08/26/17 1125  BP: 136/85  Pulse: 93  Resp: 19  Temp: 99 F (37.2 C)  SpO2: (!) 85%   Filed Weights   08/26/17 1125    Weight: (!) 337 lb 1.6 oz (152.9 kg)    GENERAL:alert, no distress and comfortable SKIN: skin color, texture, turgor are normal, no rashes or significant lesions EYES: normal, Conjunctiva are pink and non-injected, sclera clear OROPHARYNX:no exudate, no erythema and lips, buccal mucosa, and tongue normal  NECK: supple, thyroid normal size, non-tender, without nodularity LYMPH:  no palpable lymphadenopathy in the cervical, axillary or inguinal LUNGS: clear to auscultation and percussion with normal breathing effort HEART: regular rate & rhythm and no murmurs and no lower extremity edema ABDOMEN:abdomen soft, non-tender and normal bowel sounds MUSCULOSKELETAL:no cyanosis of digits and no clubbing  NEURO: alert & oriented x 3 with fluent speech, no focal motor/sensory deficits EXTREMITIES: No lower extremity edema  LABORATORY DATA:  I have reviewed the data as listed CMP Latest Ref Rng & Units 08/11/2017 02/12/2017 08/15/2016  Glucose 65 - 99 mg/dL 829(F) 80 87  BUN 6 - 20 mg/dL 15 18 16   Creatinine 0.44 - 1.00 mg/dL 6.21(H) 0.86 5.78(I)  Sodium 135 - 145 mmol/L 137 142 143  Potassium 3.5 - 5.1 mmol/L 4.2 4.7 4.7  Chloride 101 - 111 mmol/L 95(L) 99 101  CO2 22 - 32 mmol/L 38(H) 30(H) 27  Calcium 8.9 - 10.3 mg/dL 6.9(G) 9.1 9.3  Total Protein 6.0 - 8.5 g/dL - 7.7 -  Total Bilirubin 0.0 - 1.2 mg/dL - 0.4 -  Alkaline Phos 39 - 117 IU/L - 69 -  AST 0 - 40 IU/L - 18 -  ALT 0 - 32 IU/L - 16 -    Lab Results  Component Value Date   WBC 7.4 08/26/2017   HGB 11.9 08/26/2017   HCT 39.4 08/26/2017   MCV 105.3 (H) 08/26/2017   PLT 36 (L) 08/26/2017   NEUTROABS 4.7 08/26/2017    ASSESSMENT & PLAN:  Acute ITP (HCC) Platelet counts ranged from 30 on 05/07/2014 to 59 December 2018; During June and July 2017 hospitalization platelet counts were between 45-106  Treatment given: IVIG treatments 06/18/2017 Response to IVIG: Platelet count improved to 131.  Lab review 08/26/2017: Platelet  count 36 I discussed with her that she is once again relapsing on ITP. I recommended to dose of IVIG.  I will request advanced home care to arrange for home IVIG treatments.  Physical impairment: Because she is morbidly obese and uses oxygen, she is homebound and is wheelchair-bound as well.  She also has to come down 3 flights of stairs to come to clinic.  I believe she is a perfect candidate for home health infusions.  If for some reason home health cannot do these infusions and we will arrange for infusions to be given at our cancer center.    Orders Placed This Encounter  Procedures  . CBC with Differential (Cancer Center Only)    Standing Status:   Future    Standing Expiration Date:   08/27/2018   The patient has a good understanding of the overall plan. she agrees with it. she will  call with any problems that may develop before the next visit here.   Tamsen Meek, MD 08/26/17

## 2017-08-26 NOTE — Assessment & Plan Note (Signed)
Platelet counts ranged from 30 on 05/07/2014 to 59 December 2018; During June and July 2017 hospitalization platelet counts were between 45-106  Treatment given: IVIG treatments 06/18/2017 Response to IVIG: Platelet count improved to 131.  Lab review 08/26/2017: Platelet count 36 I discussed with her that she is once again relapsing on ITP. I recommended to dose of IVIG.  I will request advanced home care to arrange for home IVIG treatments.  Physical impairment: Because she is morbidly obese and uses oxygen, she is homebound and is wheelchair-bound as well.  She also has to come down 3 flights of stairs to come to clinic.  I believe she is a perfect candidate for home health infusions.  If for some reason home health cannot do these infusions and we will arrange for infusions to be given at our cancer center.

## 2017-08-26 NOTE — Telephone Encounter (Signed)
Patient called to our office around 9:35 am, to report that EMS was came to her house this weekend. She states that she had an episode of passing out and her family called EMS. She states that she can not recall passing out, but when she awakened, EMS was there. She states that she refused to go to ED at that time.  EMS RN and technicians made sure that she was stable before leaving. They advised her to go to ED if needed. She states that she did not have any additional episodes of syncope or lightheadedness.   She states that she had been having 'blood-tinged' mucus over the past weekend also. She denies easy bruising, gingival bleeding, epistaxis, profuse bleeding from superficial cuts, menorrhagia, mentrorrhagia, hematochezia, and hematuria.   She is scheduled to see Dr. Pamelia Hoit, Oncologist today at 11:00 am with lab draw today. She will call office to follow up with [place] after her appointment today.

## 2017-08-26 NOTE — Telephone Encounter (Signed)
Gave patient avs and calendar of upcoming august appointments. °

## 2017-08-27 ENCOUNTER — Other Ambulatory Visit: Payer: Self-pay | Admitting: Hematology and Oncology

## 2017-08-27 ENCOUNTER — Telehealth: Payer: Self-pay | Admitting: Hematology and Oncology

## 2017-08-27 ENCOUNTER — Encounter: Payer: Self-pay | Admitting: General Practice

## 2017-08-27 ENCOUNTER — Telehealth: Payer: Self-pay

## 2017-08-27 NOTE — Progress Notes (Signed)
[facility] CSW Progress Note  Request received for information on patient's Medicaid worker from Jeri Modena, RN who is trying to arrange services for patient.  Called [place] [employer], patient's assigned worker is [address] [phone]).  Worker was not available, information given to [address] so she can follow up.  Santa Genera, LCSW Clinical Social Worker Phone:  [phone]

## 2017-08-27 NOTE — Telephone Encounter (Signed)
Tried to call regarding 7/5  However the voicemail was not set up

## 2017-08-27 NOTE — Progress Notes (Signed)
I was informed that she does not have any insurance and that her Medicaid is no longer active.  So  the home health agency is not able to do her treatment. We will arrange for her IVIG treatments to be given in our clinic.

## 2017-08-27 NOTE — Telephone Encounter (Signed)
Per Dr Pamelia Hoit, I called Melanie AD to set up appt for IVIG here at [facility] for Friday and Saturday of this week.  Shawna Orleans said to send urgent message to scheduling for Sat at 7:30-8am and she will put in appt for Fri).  I called pt and let her know that scheduling will call her with appts for Fri and Sat but just let her know that IVIG thru home health didn't work out due to insurance reasons but that we could administer in the cancer center.  Pt voiced understanding and no other needs per her at this time.

## 2017-08-30 ENCOUNTER — Inpatient Hospital Stay: Payer: Medicaid Other

## 2017-08-31 ENCOUNTER — Inpatient Hospital Stay: Payer: Medicaid Other

## 2017-09-05 ENCOUNTER — Telehealth: Payer: Self-pay

## 2017-09-05 NOTE — Telephone Encounter (Signed)
Returned pt's call regarding she would like to be scheduled for her IVIG appt.  Reports she never received a call last week to be scheduled. I explained by the notes that scheduling attempted but received message that pt's voicemail hasn't been set up.  I let her know that we Vikki Ports RN) has sent a message (inbasket) to scheduling to reschedule patient.  Pt voiced understanding and no other needs per pt at this time.

## 2017-09-09 ENCOUNTER — Telehealth: Payer: Self-pay | Admitting: Hematology and Oncology

## 2017-09-09 NOTE — Telephone Encounter (Signed)
Spoke to patients sister regarding upcoming July appts per 7/11 sch message

## 2017-09-09 NOTE — Telephone Encounter (Signed)
Patients vm is not set up

## 2017-09-10 ENCOUNTER — Other Ambulatory Visit: Payer: Self-pay

## 2017-09-10 ENCOUNTER — Other Ambulatory Visit: Payer: Self-pay | Admitting: Medical

## 2017-09-10 ENCOUNTER — Other Ambulatory Visit: Payer: Self-pay | Admitting: Hematology and Oncology

## 2017-09-10 ENCOUNTER — Inpatient Hospital Stay: Payer: Medicaid Other | Admitting: Medical

## 2017-09-10 ENCOUNTER — Inpatient Hospital Stay: Payer: Medicaid Other

## 2017-09-10 VITALS — BP 145/94 | HR 94 | Temp 98.0°F | Resp 25

## 2017-09-10 DIAGNOSIS — I5042 Chronic combined systolic (congestive) and diastolic (congestive) heart failure: Secondary | ICD-10-CM

## 2017-09-10 DIAGNOSIS — R062 Wheezing: Secondary | ICD-10-CM

## 2017-09-10 DIAGNOSIS — D693 Immune thrombocytopenic purpura: Secondary | ICD-10-CM | POA: Diagnosis not present

## 2017-09-10 DIAGNOSIS — J0100 Acute maxillary sinusitis, unspecified: Secondary | ICD-10-CM

## 2017-09-10 DIAGNOSIS — R0902 Hypoxemia: Secondary | ICD-10-CM

## 2017-09-10 MED ORDER — ALBUTEROL SULFATE (2.5 MG/3ML) 0.083% IN NEBU
2.5000 mg | INHALATION_SOLUTION | Freq: Once | RESPIRATORY_TRACT | Status: AC
  Administered 2017-09-10: 2.5 mg via RESPIRATORY_TRACT
  Filled 2017-09-10: qty 3

## 2017-09-10 MED ORDER — LOSARTAN POTASSIUM 50 MG PO TABS: 50.0000 mg | ORAL_TABLET | Freq: Every day | ORAL | 1 refills | Status: AC

## 2017-09-10 MED ORDER — IMMUNE GLOBULIN (HUMAN) 20 GM/200ML IV SOLN
50.0000 g | Freq: Once | INTRAVENOUS | Status: AC
  Administered 2017-09-10: 50 g via INTRAVENOUS
  Filled 2017-09-10: qty 400

## 2017-09-10 MED ORDER — IMMUNE GLOBULIN (HUMAN) 10 GM/100ML IV SOLN: 1.0000 g/kg | Freq: Once | INTRAVENOUS | Status: DC

## 2017-09-10 MED ORDER — ONDANSETRON HCL 4 MG/2ML IJ SOLN
8.0000 mg | Freq: Once | INTRAMUSCULAR | Status: AC
  Administered 2017-09-10: 8 mg via INTRAVENOUS

## 2017-09-10 MED ORDER — ACETAMINOPHEN 325 MG PO TABS
ORAL_TABLET | ORAL | Status: AC
  Filled 2017-09-10: qty 2

## 2017-09-10 MED ORDER — IMMUNE GLOBULIN (HUMAN) 20 GM/200ML IV SOLN
100.0000 g | Freq: Once | INTRAVENOUS | Status: AC
  Administered 2017-09-10: 100 g via INTRAVENOUS
  Filled 2017-09-10: qty 1000

## 2017-09-10 MED ORDER — AMOXICILLIN-POT CLAVULANATE 875-125 MG PO TABS: 1.0000 | ORAL_TABLET | Freq: Two times a day (BID) | ORAL | 0 refills | Status: AC

## 2017-09-10 MED ORDER — IMMUNE GLOBULIN (HUMAN) 10 GM/100ML IJ SOLN
50.0000 g | Freq: Once | INTRAMUSCULAR | Status: DC
  Filled 2017-09-10: qty 500

## 2017-09-10 MED ORDER — ALBUTEROL SULFATE (2.5 MG/3ML) 0.083% IN NEBU
2.5000 mg | INHALATION_SOLUTION | Freq: Once | RESPIRATORY_TRACT | Status: DC
  Filled 2017-09-10: qty 3

## 2017-09-10 MED ORDER — ALBUTEROL SULFATE (2.5 MG/3ML) 0.083% IN NEBU
INHALATION_SOLUTION | RESPIRATORY_TRACT | Status: AC
  Filled 2017-09-10: qty 3

## 2017-09-10 MED ORDER — DIPHENHYDRAMINE HCL 25 MG PO TABS
25.0000 mg | ORAL_TABLET | Freq: Once | ORAL | Status: AC
  Administered 2017-09-10: 25 mg via ORAL
  Filled 2017-09-10: qty 1

## 2017-09-10 MED ORDER — ONDANSETRON HCL 4 MG/2ML IJ SOLN
INTRAMUSCULAR | Status: AC
Start: 2017-09-10 — End: ?
  Filled 2017-09-10: qty 4

## 2017-09-10 MED ORDER — SODIUM CHLORIDE 0.9 % IV SOLN: Freq: Once | INTRAVENOUS | Status: DC

## 2017-09-10 MED ORDER — DIPHENHYDRAMINE HCL 25 MG PO CAPS
ORAL_CAPSULE | ORAL | Status: AC
  Filled 2017-09-10: qty 1

## 2017-09-10 MED ORDER — IMMUNE GLOBULIN (HUMAN) 10 GM/100ML IJ SOLN
100.0000 g | Freq: Once | INTRAMUSCULAR | Status: DC
  Filled 2017-09-10: qty 1000

## 2017-09-10 MED ORDER — ACETAMINOPHEN 325 MG PO TABS
650.0000 mg | ORAL_TABLET | Freq: Once | ORAL | Status: AC
  Administered 2017-09-10: 650 mg via ORAL

## 2017-09-10 NOTE — Progress Notes (Signed)
Pt o2 sats dipping into the 70's on 2L [place].  Increased o2 to 4 L [place] and Marga Hoots, PA notified.  Orders received to administer albuterol neb treatment.

## 2017-09-10 NOTE — Patient Instructions (Signed)

## 2017-09-11 NOTE — Addendum Note (Signed)
Addended by: Ceasar Mons on: 09/11/2017 05:01 PM   Modules accepted: Level of Service

## 2017-09-11 NOTE — Progress Notes (Signed)
[facility] Progress Note   Jennifer Owens [id number] 1970-06-13 47 y.o.  Jennifer Owens is managed by Dr. Serena Croissant  Actively treated with chemotherapy/immunotherapy: no  Current Therapy: Privigen  Assessment: Plan:    Hypoxia  Acute non-recurrent maxillary sinusitis  Wheezing   Hypoxia: Patient was noted to have an oxygen saturation of 77% despite receiving oxygen at 5 L/min via nasal cannula.  The patient continued on oxygen and was able to have improvement of her oxygen saturations to a level of 95 to 100%.  She was referred for a chest x-ray.  Maxillary sinusitis: The patient was given a prescription for Augmentin 875-125 p.o. twice daily x10 days.  Wheezing: The patient was given an albuterol nebulizer treatment with resolution of wheezing.  Please see After Visit Summary for patient specific instructions.  Future Appointments  Date Time Provider Department Center  09/13/2017 10:15 AM [facility] INFUSION [facility] None  09/18/2017 10:45 AM Noel Journey D, RD [facility]  09/30/2017  9:45 AM [facility] LAB 5 [facility] None  09/30/2017 10:15 AM Serena Croissant, MD [facility] None  10/29/2017 10:00 AM Kallie Locks, FNP [facility] None    No orders of the defined types were placed in this encounter.      Subjective:   Patient ID:  Jennifer Owens is a 47 y.o. (DOB [birth year]) female.  Chief Complaint: No chief complaint on file.   HPI [name] is a 47 year old female with a complex medical history who is managed by Dr. Serena Croissant for her history of ITP.  She was seen in the infusion room today while receiving Prodigen.  She was seen for hypoxia with her oxygen saturation at 77% despite oxygen at 2 L via nasal cannula.  Patient has a history of asthma and pulmonary hypertension.  She is on home oxygen.  She has had a productive cough with brownish sputum.  She has a history of episodic sinus infections.  She reports maxillary facial  pressure.  She denies fevers, chills, or sweats.  Medications: I have reviewed the patient's current medications.  Allergies: No Known Allergies  Past Medical History:  Diagnosis Date  . Asthma   . Chronic combined systolic and diastolic CHF (congestive heart failure) (HCC)    a. Echo 5/17: mild LVH, EF 55-60%, no RWMA, Gr 1 DD, mildly dilated Ao root, trivial eff //  b. Echo 6/17: mild conc LVH, EF 35-40%, RVE with mod RV dysfunction  . Hypertensive heart disease with CHF (congestive heart failure) (HCC)   . Moderate to severe pulmonary hypertension (HCC)    a RHC 7/17 with PAP 76/30  . Morbid obesity (HCC)   . NICM (nonischemic cardiomyopathy) (HCC)    a. LHC 7/17 with normal cors  . Obstructive sleep apnea 02/12/2017  . Sciatica   . Thrombocytopenia (HCC)   . Tobacco abuse     Past Surgical History:  Procedure Laterality Date  . APPENDECTOMY    . CARDIAC CATHETERIZATION N/A 08/29/2015   Procedure: Right/Left Heart Cath and Coronary Angiography;  Surgeon: [name], MD;  Location: [facility] INVASIVE CV LAB;  Service: Cardiovascular;  Laterality: N/A;  . CESAREAN SECTION    . CHOLECYSTECTOMY    . SMALL INTESTINE SURGERY      Family History  Problem Relation Age of Onset  . Diabetes Mellitus II Mother   . Hypertension Mother   . Diabetes Mellitus II Father     Social History   Socioeconomic History  .  Marital status: Legally Separated    Spouse name: Not on file  . Number of children: Not on file  . Years of education: Not on file  . Highest education level: Not on file  Occupational History  . 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 Financial resource strain: Not on file  . Food insecurity:    Worry: Not on file    Inability: Not on file  . Transportation needs:    Medical: Not on file    Non-medical: Not on file  Tobacco Use  . Smoking status: Former Smoker    Packs/day: 0.25    Years: 10.00    Pack years: 2.50    Types: Cigarettes     Last attempt to quit: 07/2015    Years since quitting: 2.1  . Smokeless tobacco: Never Used  Substance and Sexual Activity  . Alcohol use: No    Alcohol/week: 0.6 oz    Types: 1 Standard drinks or equivalent per week    Comment: very rarely - 1-2 drinks/month  . Drug use: No  . Sexual activity: Not on file  Lifestyle  . Physical activity:    Days per week: Not on file    Minutes per session: Not on file  . Stress: Not on file  Relationships  . Social connections:    Talks on phone: Not on file    Gets together: Not on file    Attends religious service: Not on file    Active member of club or organization: Not on file    Attends meetings of clubs or organizations: Not on file    Relationship status: Not on file  . Intimate partner violence:    Fear of current or ex partner: Not on file    Emotionally abused: Not on file    Physically abused: Not on file    Forced sexual activity: Not on file  Other Topics Concern  . Not on file  Social History Narrative  . Not on file    Past Medical History, Surgical history, Social history, and Family history were reviewed and updated as appropriate.   Please see review of systems for further details on the patient's review from today.   Review of Systems:  Review of Systems  Constitutional: Negative for chills, diaphoresis, fatigue and fever.  HENT: Positive for postnasal drip, sinus pressure and sinus pain. Negative for congestion, facial swelling, rhinorrhea and sore throat.   Respiratory: Positive for cough and shortness of breath.   Cardiovascular: Negative for chest pain and palpitations.  Gastrointestinal: Negative for nausea and vomiting.  Neurological: Negative for dizziness and headaches.    Objective:   Physical Exam:  There were no vitals taken for this visit. ECOG: 2  Physical Exam  Constitutional: No distress.  The patient is an adult female who appears older than his stated age and is receiving oxygen via nasal  cannula.  The patient was examined while seated in an infusion chair.  HENT:  Head: Normocephalic and atraumatic.  Nose: Right sinus exhibits frontal sinus tenderness. Right sinus exhibits no maxillary sinus tenderness. Left sinus exhibits frontal sinus tenderness. Left sinus exhibits no maxillary sinus tenderness.  Mouth/Throat: No oropharyngeal exudate.  Cardiovascular: Normal rate, regular rhythm and normal heart sounds. Exam reveals no gallop and no friction rub.  No murmur heard. Pulmonary/Chest: Effort normal. No respiratory distress. She has wheezes (Diffuse wheezes throughout all lung fields.). She has no rales.  Neurological: She is alert.  Skin: Skin is warm and dry. No rash noted. She is not diaphoretic. No erythema.   Pre-albuterol exam: See above physical exam  Post-albuterol exam: Diffuse wheezes throughout all lung fields have resolved.  Lab Review:     Component Value Date/Time   NA 137 08/11/2017 0445   NA 142 02/12/2017 1409   K 4.2 08/11/2017 0445   CL 95 (L) 08/11/2017 0445   CO2 38 (H) 08/11/2017 0445   GLUCOSE 121 (H) 08/11/2017 0445   BUN 15 08/11/2017 0445   BUN 18 02/12/2017 1409   CREATININE 1.03 (H) 08/11/2017 0445   CREATININE 0.98 06/21/2016 1018   CALCIUM 8.4 (L) 08/11/2017 0445   PROT 7.7 02/12/2017 1409   ALBUMIN 4.0 02/12/2017 1409   AST 18 02/12/2017 1409   ALT 16 02/12/2017 1409   ALKPHOS 69 02/12/2017 1409   BILITOT 0.4 02/12/2017 1409   GFRNONAA >60 08/11/2017 0445   GFRNONAA 69 06/21/2016 1018   GFRAA >60 08/11/2017 0445   GFRAA 80 06/21/2016 1018       Component Value Date/Time   WBC 7.4 08/26/2017 1101   WBC 8.4 08/11/2017 0445   RBC 3.74 08/26/2017 1101   HGB 11.9 08/26/2017 1101   HGB 12.5 02/22/2017 1500   HCT 39.4 08/26/2017 1101   HCT 38.9 02/22/2017 1500   PLT 36 (L) 08/26/2017 1101   PLT 59 (LL) 02/22/2017 1500   MCV 105.3 (H) 08/26/2017 1101   MCV 97 02/22/2017 1500   MCH 31.8 08/26/2017 1101   MCHC 30.2 (L)  08/26/2017 1101   RDW 13.7 08/26/2017 1101   RDW 14.1 02/22/2017 1500   LYMPHSABS 1.8 08/26/2017 1101   LYMPHSABS 1.9 02/22/2017 1500   MONOABS 0.8 08/26/2017 1101   EOSABS 0.1 08/26/2017 1101   EOSABS 0.3 02/22/2017 1500   BASOSABS 0.0 08/26/2017 1101   BASOSABS 0.0 02/22/2017 1500   -------------------------------  Imaging from last 24 hours (if applicable):  Radiology interpretation: No results found.

## 2017-09-12 ENCOUNTER — Telehealth: Payer: Self-pay | Admitting: Hematology and Oncology

## 2017-09-12 ENCOUNTER — Telehealth: Payer: Self-pay

## 2017-09-12 MED ORDER — GENERIC EXTERNAL MEDICATION
1.00 | Status: DC
Start: ? — End: 2017-09-12

## 2017-09-12 MED ORDER — FAMOTIDINE 20 MG/2ML IV SOLN
20.00 | INTRAVENOUS | Status: DC
Start: 2017-09-12 — End: 2017-09-12

## 2017-09-12 MED ORDER — HYDROCORTISONE NA SUCCINATE PF 100 MG IJ SOLR
100.00 | INTRAMUSCULAR | Status: DC
Start: 2017-09-12 — End: 2017-09-12

## 2017-09-12 MED ORDER — FUROSEMIDE 10 MG/ML IJ SOLN
20.00 | INTRAMUSCULAR | Status: DC
Start: 2017-09-12 — End: 2017-09-12

## 2017-09-12 MED ORDER — GLUCOSE 40 % PO GEL
15.00 g | ORAL | Status: DC
Start: ? — End: 2017-09-12

## 2017-09-12 MED ORDER — DEXTROSE-NACL 5-0.9 % IV SOLN
INTRAVENOUS | Status: DC
Start: ? — End: 2017-09-12

## 2017-09-12 MED ORDER — ALBUTEROL SULFATE HFA 108 (90 BASE) MCG/ACT IN AERS
4.00 | INHALATION_SPRAY | RESPIRATORY_TRACT | Status: DC
Start: 2017-09-12 — End: 2017-09-12

## 2017-09-12 MED ORDER — GENERIC EXTERNAL MEDICATION
2.00 | Status: DC
Start: ? — End: 2017-09-12

## 2017-09-12 MED ORDER — ACETAMINOPHEN 325 MG PO TABS
650.00 | ORAL_TABLET | ORAL | Status: DC
Start: ? — End: 2017-09-12

## 2017-09-12 MED ORDER — DEXTROSE 50 % IV SOLN
12.00 g | INTRAVENOUS | Status: DC
Start: ? — End: 2017-09-12

## 2017-09-12 MED ORDER — INSULIN LISPRO 100 UNIT/ML ~~LOC~~ SOLN
3.00 | SUBCUTANEOUS | Status: DC
Start: 2017-09-12 — End: 2017-09-12

## 2017-09-12 MED ORDER — LORAZEPAM 2 MG/ML IJ SOLN
2.00 | INTRAMUSCULAR | Status: DC
Start: ? — End: 2017-09-12

## 2017-09-12 MED ORDER — ALBUTEROL SULFATE (2.5 MG/3ML) 0.083% IN NEBU
2.50 | INHALATION_SOLUTION | RESPIRATORY_TRACT | Status: DC
Start: ? — End: 2017-09-12

## 2017-09-12 MED ORDER — GENERIC EXTERNAL MEDICATION
3.00 g | Status: DC
Start: 2017-09-12 — End: 2017-09-12

## 2017-09-12 MED ORDER — DEXTROSE 10 % IV SOLN
125.00 | INTRAVENOUS | Status: DC
Start: ? — End: 2017-09-12

## 2017-09-12 MED ORDER — ONDANSETRON HCL 4 MG/2ML IJ SOLN
4.00 | INTRAMUSCULAR | Status: DC
Start: ? — End: 2017-09-12

## 2017-09-12 MED ORDER — FENTANYL CITRATE (PF) 50 MCG/ML IJ SOLN
50.00 | INTRAMUSCULAR | Status: DC
Start: ? — End: 2017-09-12

## 2017-09-12 NOTE — Telephone Encounter (Signed)
Patients fiance called to cancel appts.

## 2017-09-12 NOTE — Telephone Encounter (Signed)
Received VM from "Shanda Bumps" patient's partner.  Returned her call and she would like to update Dr Pamelia Hoit that patient was admitted on Tuesday, July 16th, to [facility], ICU, room 411'. Per release in Epic, we have permission to speak with partner. Notified Loreta Ave, Director and routed note to Dr Pamelia Hoit.

## 2017-09-13 ENCOUNTER — Ambulatory Visit: Payer: Self-pay

## 2017-09-18 ENCOUNTER — Telehealth: Payer: Self-pay | Admitting: *Deleted

## 2017-09-18 ENCOUNTER — Ambulatory Visit: Payer: Self-pay | Admitting: Registered"

## 2017-09-18 NOTE — Telephone Encounter (Signed)
Received call from patients fiance that patient passed away at [facility] 10/16/17. Dr Duke Salvia made aware.

## 2017-09-26 DEATH — deceased

## 2017-09-30 ENCOUNTER — Other Ambulatory Visit: Payer: Self-pay

## 2017-09-30 ENCOUNTER — Ambulatory Visit: Payer: Self-pay | Admitting: Hematology and Oncology

## 2017-10-22 IMAGING — CR DG FOOT COMPLETE 3+V*L*
3 series · 3 of 3 positions shown · non-contrast
Comparison: None.

CLINICAL DATA: Pain

EXAM:
LEFT FOOT - COMPLETE 3+ VIEW

[x foot lat left (1 of 2)]
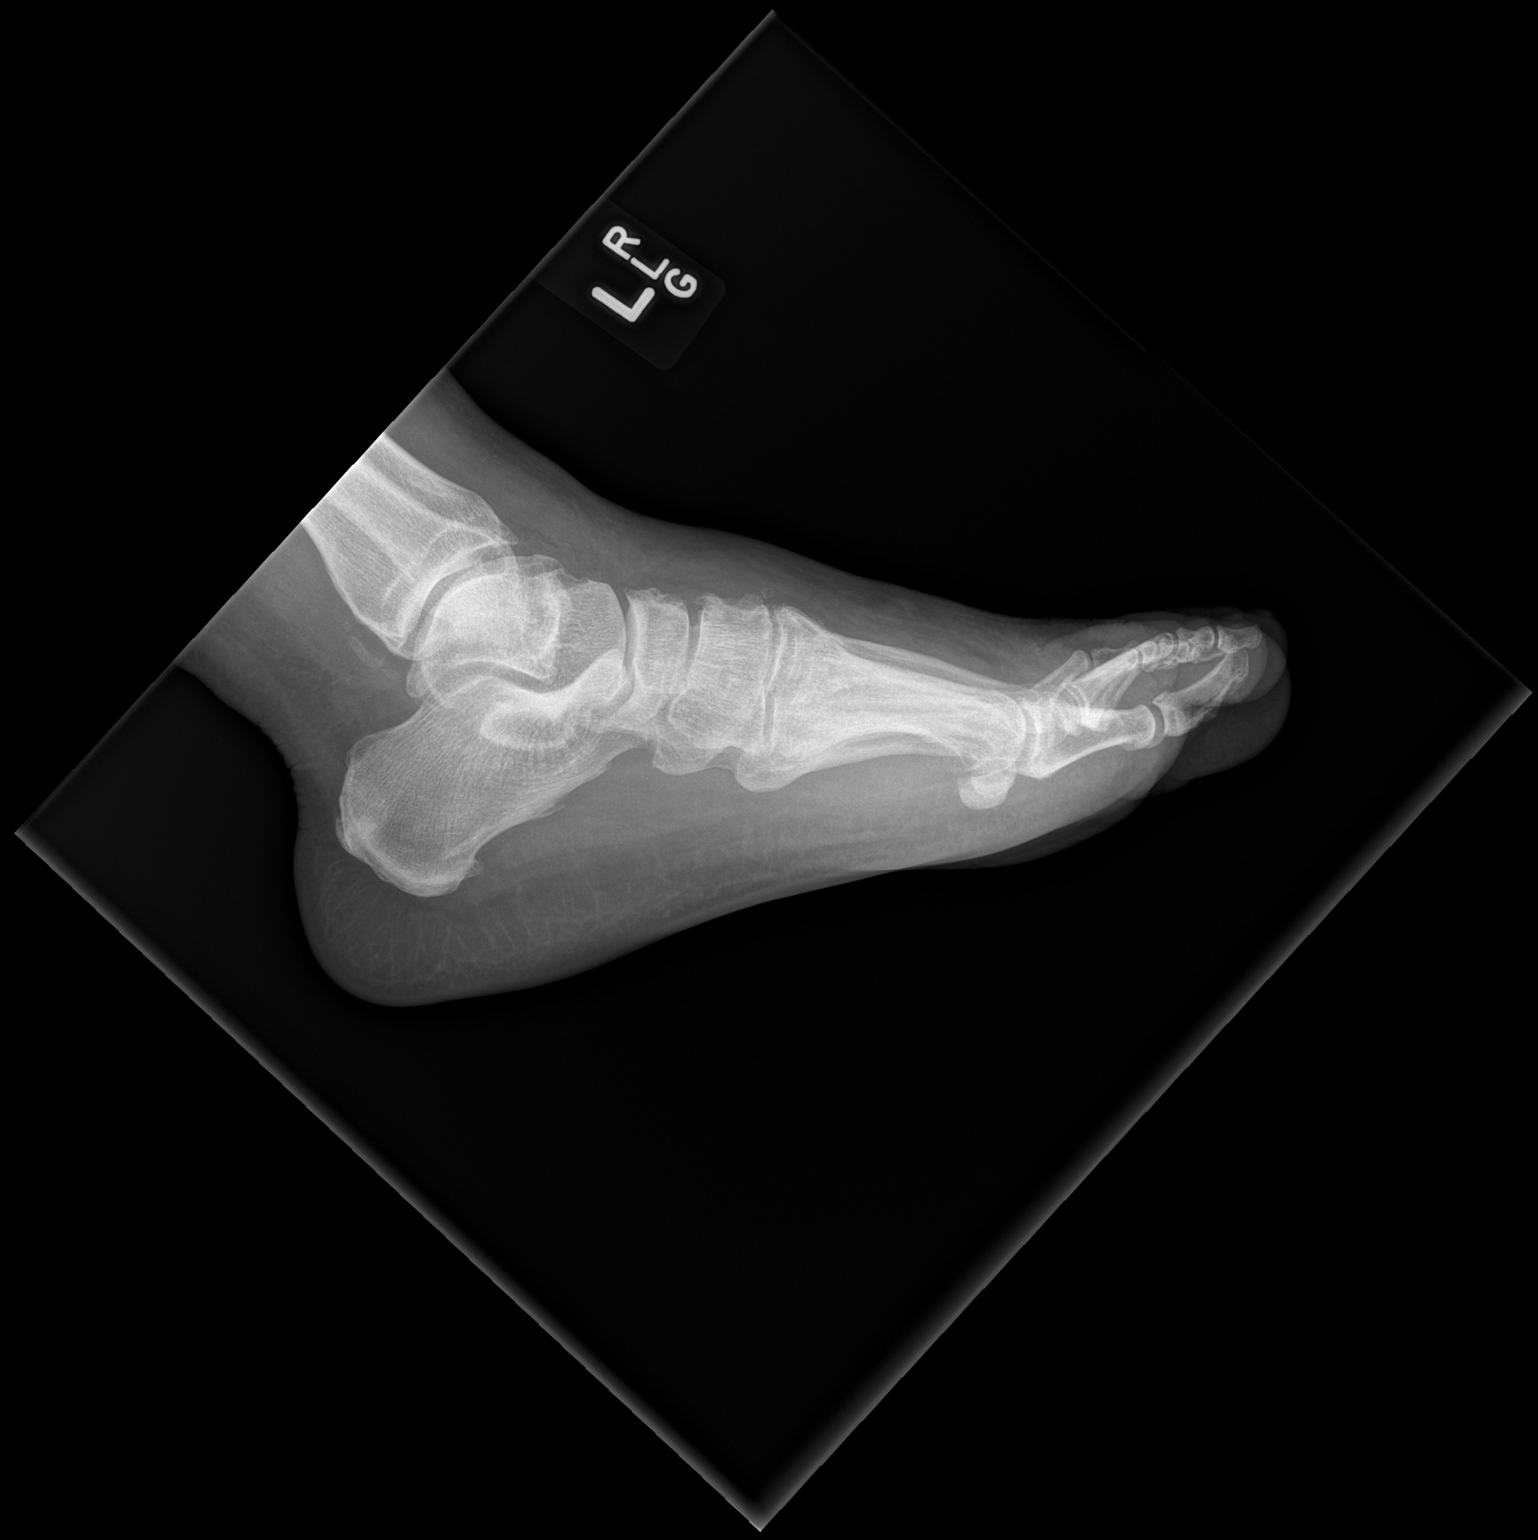

[x foot lat left (2 of 2)]
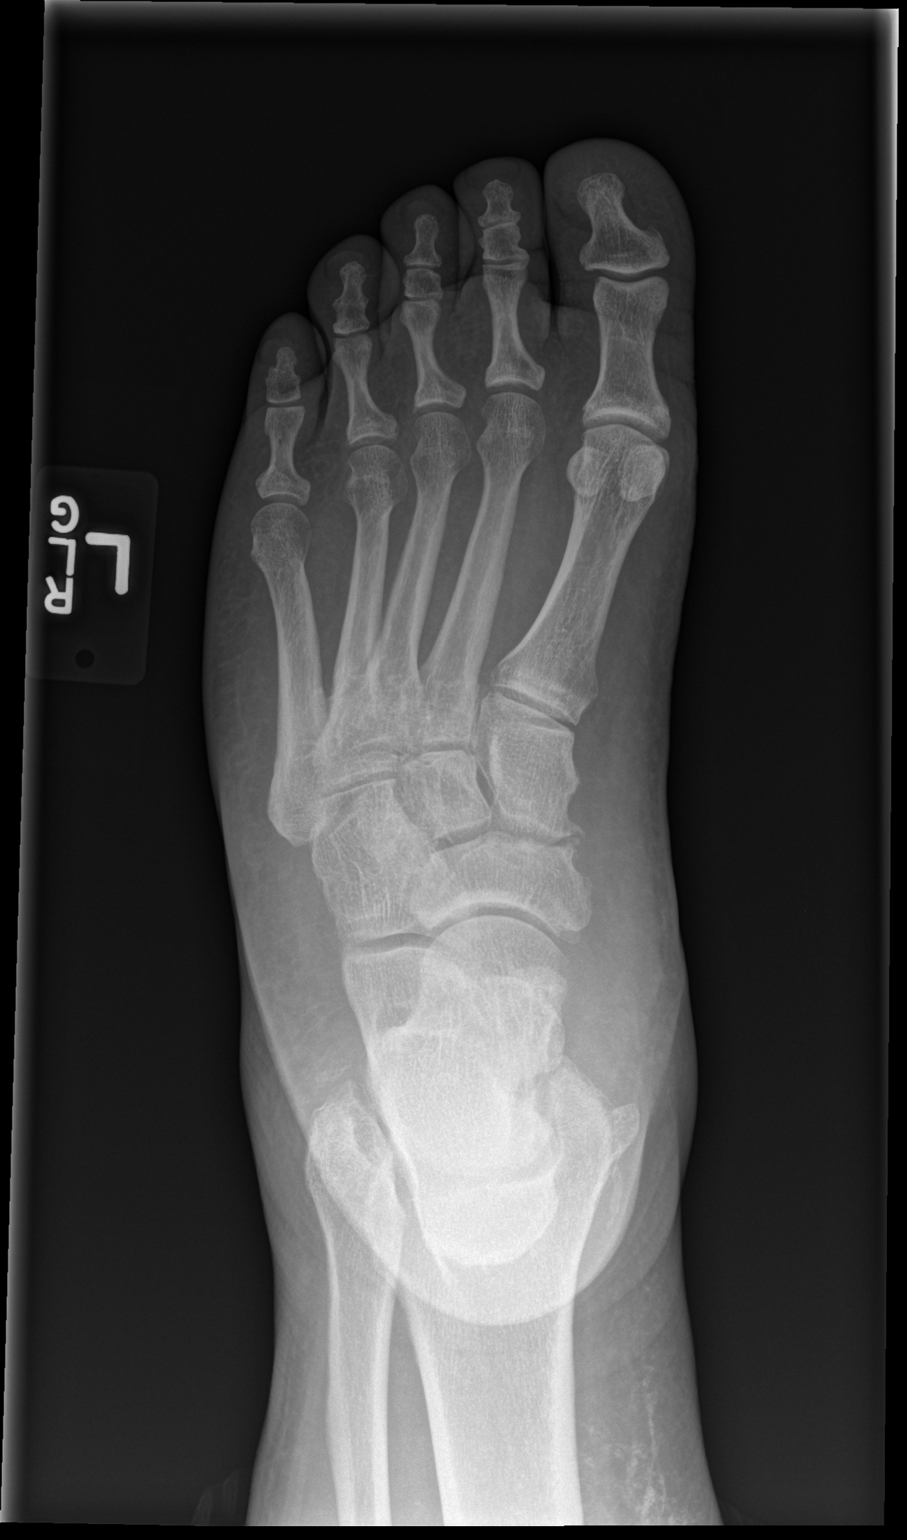

[x foot obl left]
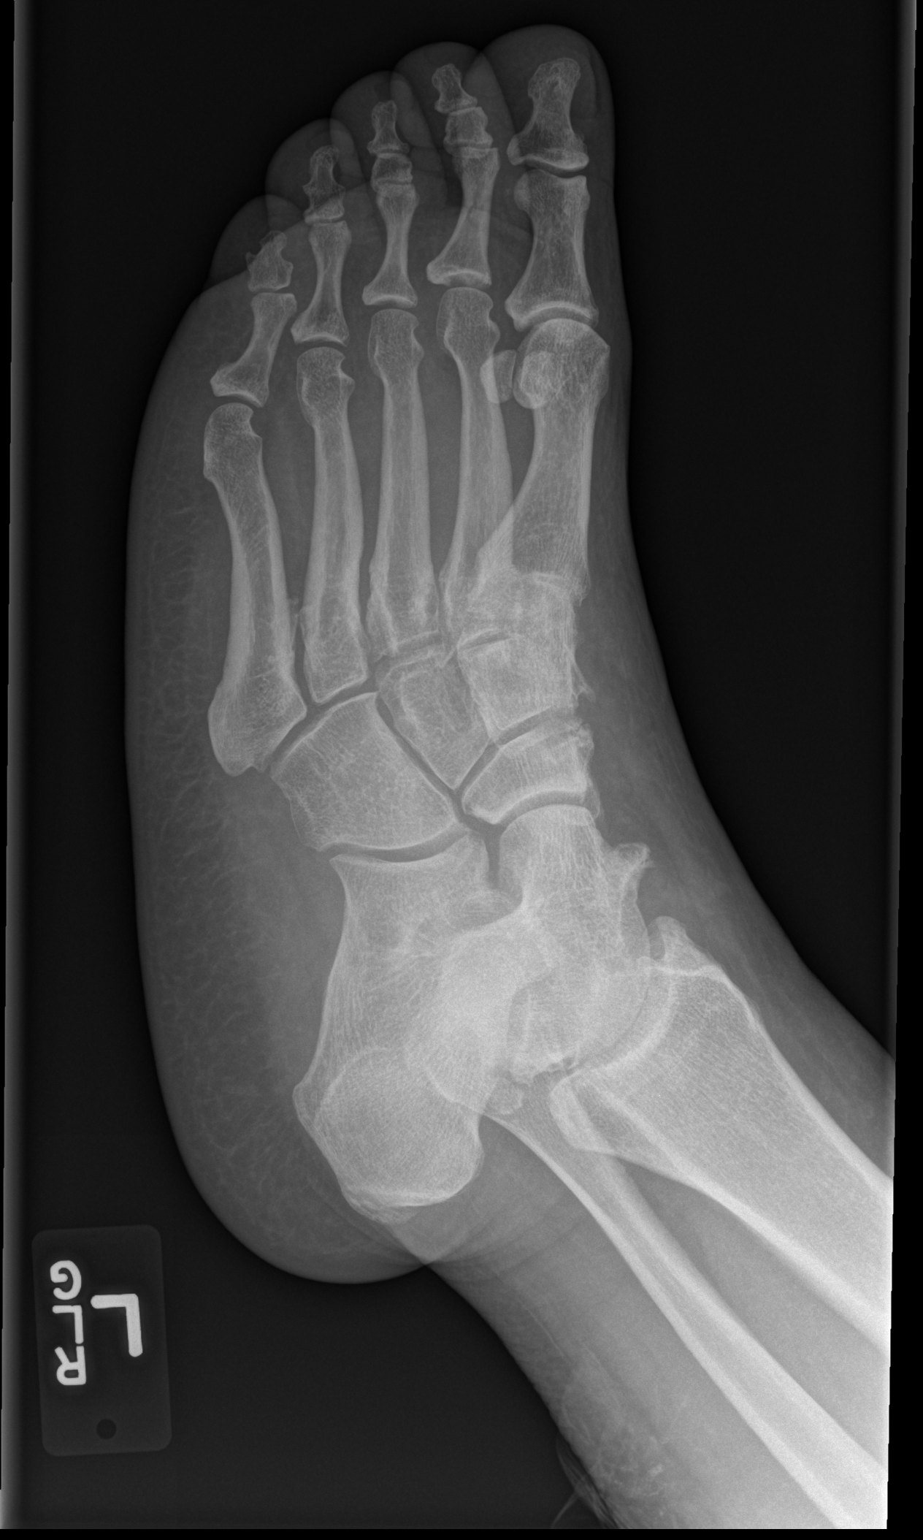

[3 of 3 positions shown; findings below may reference images not displayed]

FINDINGS: There is no evidence of fracture or dislocation. There is mild
osteoarthritis of the navicular-medial cuneiform joint and first MTP
joint. There is mild osteoarthritis of the talonavicular joint.
There is a small plantar calcaneal spur. Soft tissues are
unremarkable.
IMPRESSION: No acute osseous injury of the left foot.

## 2017-10-29 ENCOUNTER — Ambulatory Visit: Payer: Self-pay | Admitting: Family Medicine

## 2018-03-06 IMAGING — DX DG CHEST 1V PORT
1 series · 1 of 1 positions shown · non-contrast
Comparison: Chest radiograph performed 01/31/2006

CLINICAL DATA: Acute onset of shortness of breath. Initial
encounter.

EXAM:
PORTABLE CHEST 1 VIEW

[chest ap]
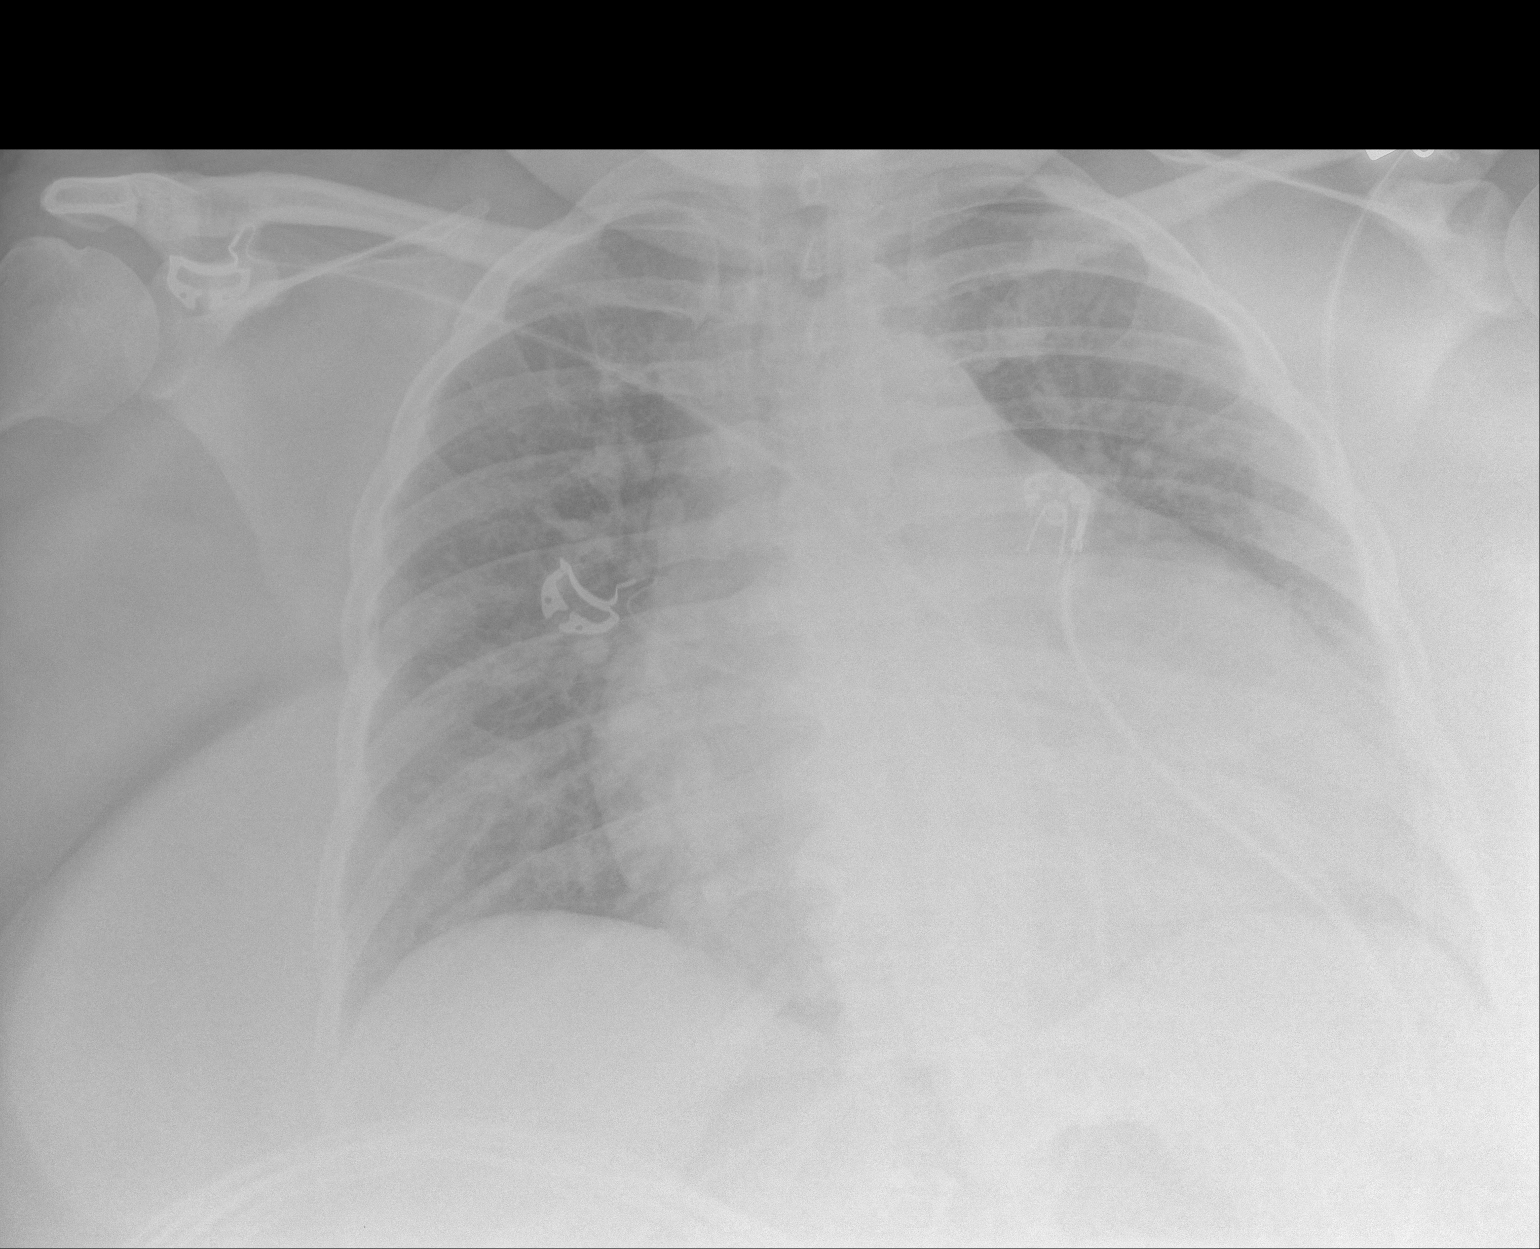

[1 of 1 positions shown; findings below may reference images not displayed]

FINDINGS: The lungs are well-aerated. Vascular congestion is noted. Slightly
increased interstitial markings could reflect minimal interstitial
edema. There is no evidence of pleural effusion or pneumothorax.

The cardiomediastinal silhouette is enlarged. No acute osseous
abnormalities are seen.
IMPRESSION: Vascular congestion and cardiomegaly. Slightly increased
interstitial markings could reflect minimal interstitial edema.

## 2019-11-20 IMAGING — DX DG CHEST 2V
2 series · 2 of 2 positions shown · non-contrast
Comparison: March 05, 2017

CLINICAL DATA: Shortness of Breath

EXAM:
CHEST  2 VIEW

[chest pa]
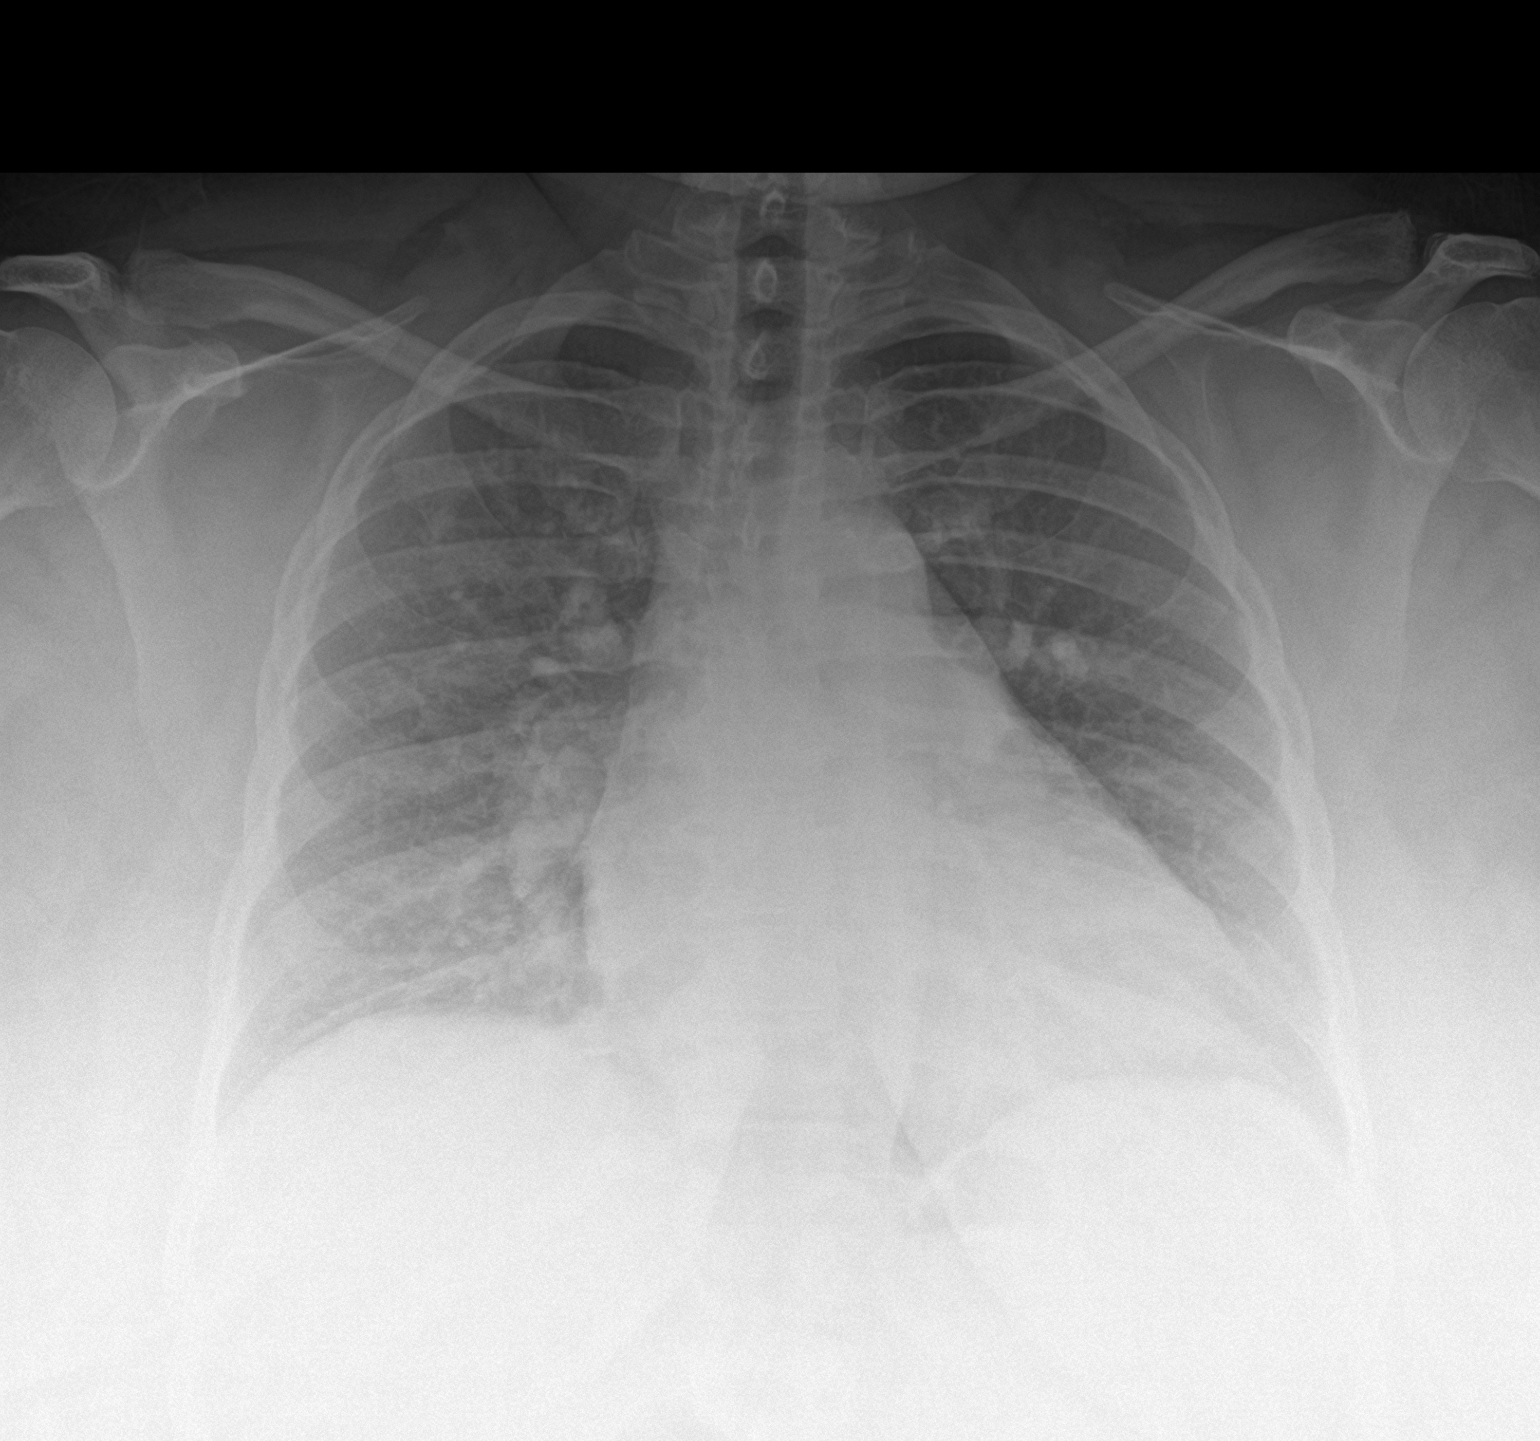

[chest lat]
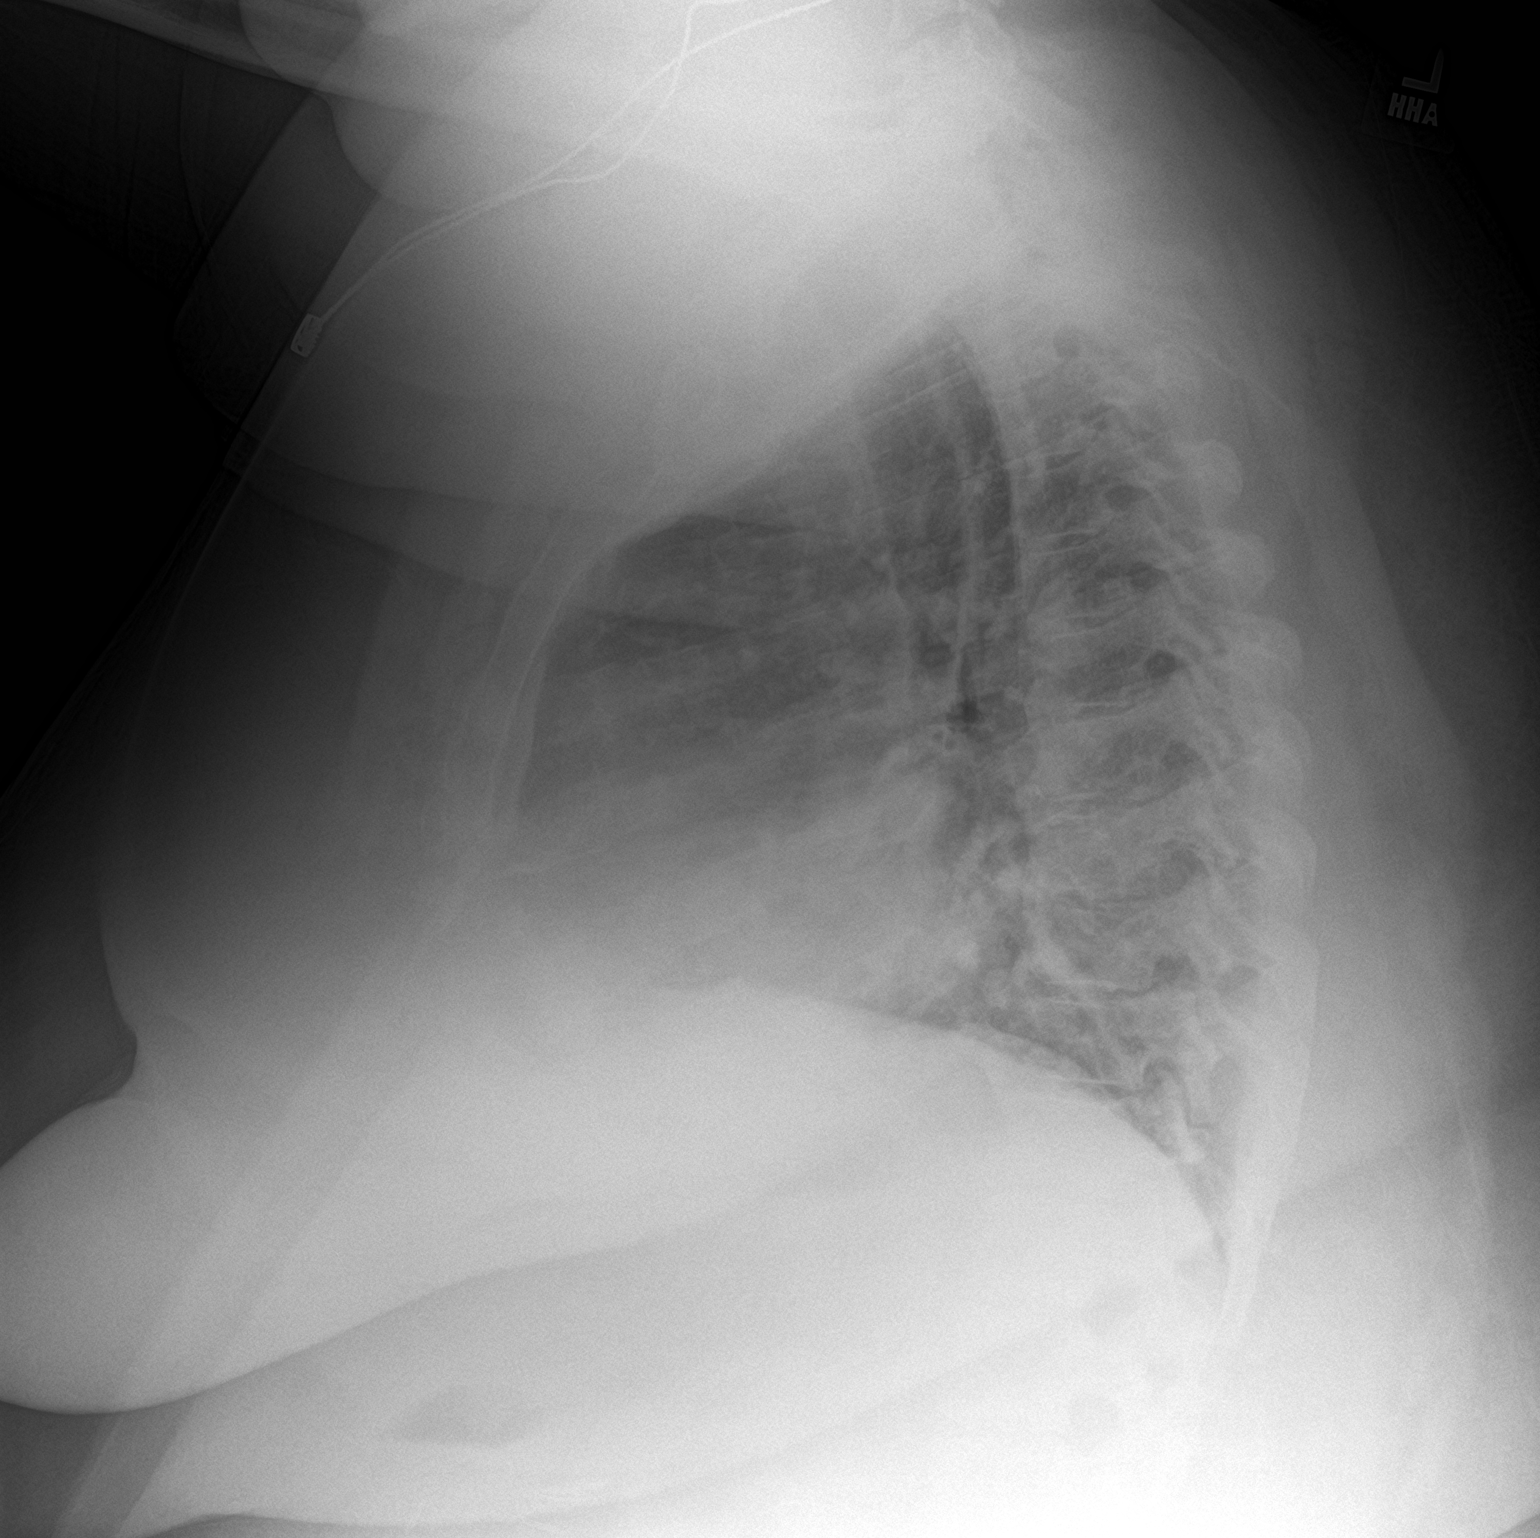

[2 of 2 positions shown; findings below may reference images not displayed]

FINDINGS: Lungs are clear. Heart is mildly enlarged with pulmonary vascularity
within normal limits. No adenopathy. There is mild degenerative
change in the thoracic spine.
IMPRESSION: Stable cardiac prominence.  No edema or consolidation.
